# Patient Record
Sex: Female | Born: 1950 | Race: White | Hispanic: No | Marital: Married | Smoking: Former smoker
Health system: Southern US, Community
[De-identification: ages and names within clinical notes are randomized; demographics above are authoritative.]

## PROBLEM LIST (undated history)

## (undated) DIAGNOSIS — R011 Cardiac murmur, unspecified: Secondary | ICD-10-CM

## (undated) DIAGNOSIS — K219 Gastro-esophageal reflux disease without esophagitis: Secondary | ICD-10-CM

## (undated) DIAGNOSIS — M545 Low back pain, unspecified: Secondary | ICD-10-CM

## (undated) DIAGNOSIS — B029 Zoster without complications: Secondary | ICD-10-CM

## (undated) DIAGNOSIS — J449 Chronic obstructive pulmonary disease, unspecified: Secondary | ICD-10-CM

## (undated) DIAGNOSIS — F419 Anxiety disorder, unspecified: Secondary | ICD-10-CM

## (undated) DIAGNOSIS — N390 Urinary tract infection, site not specified: Secondary | ICD-10-CM

## (undated) DIAGNOSIS — J42 Unspecified chronic bronchitis: Secondary | ICD-10-CM

## (undated) DIAGNOSIS — M1990 Unspecified osteoarthritis, unspecified site: Secondary | ICD-10-CM

## (undated) DIAGNOSIS — G8929 Other chronic pain: Secondary | ICD-10-CM

## (undated) DIAGNOSIS — J189 Pneumonia, unspecified organism: Secondary | ICD-10-CM

## (undated) DIAGNOSIS — T4145XA Adverse effect of unspecified anesthetic, initial encounter: Secondary | ICD-10-CM

## (undated) DIAGNOSIS — I341 Nonrheumatic mitral (valve) prolapse: Secondary | ICD-10-CM

## (undated) DIAGNOSIS — F329 Major depressive disorder, single episode, unspecified: Secondary | ICD-10-CM

## (undated) DIAGNOSIS — E7800 Pure hypercholesterolemia, unspecified: Secondary | ICD-10-CM

## (undated) DIAGNOSIS — F32A Depression, unspecified: Secondary | ICD-10-CM

## (undated) DIAGNOSIS — T8859XA Other complications of anesthesia, initial encounter: Secondary | ICD-10-CM

## (undated) DIAGNOSIS — F429 Obsessive-compulsive disorder, unspecified: Secondary | ICD-10-CM

## (undated) DIAGNOSIS — G43909 Migraine, unspecified, not intractable, without status migrainosus: Secondary | ICD-10-CM

## (undated) HISTORY — PX: CHOLECYSTECTOMY: SHX55

## (undated) HISTORY — PX: APPENDECTOMY: SHX54

## (undated) HISTORY — PX: OTHER SURGICAL HISTORY: SHX169

## (undated) HISTORY — PX: CARPAL TUNNEL RELEASE: SHX101

---
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
## 1976-05-21 HISTORY — PX: ABDOMINAL HYSTERECTOMY: SHX81

## 1976-05-21 HISTORY — PX: KIDNEY SURGERY: SHX687

## 1993-07-21 HISTORY — PX: WRIST SURGERY: SHX841

## 2001-10-26 ENCOUNTER — Ambulatory Visit (HOSPITAL_COMMUNITY): Admission: RE | Admit: 2001-10-26 | Discharge: 2001-10-26 | Payer: Self-pay | Admitting: Neurology

## 2001-10-26 ENCOUNTER — Encounter: Payer: Self-pay | Admitting: Neurology

## 2001-12-28 ENCOUNTER — Emergency Department (HOSPITAL_COMMUNITY): Admission: EM | Admit: 2001-12-28 | Discharge: 2001-12-28 | Payer: Self-pay | Admitting: Emergency Medicine

## 2003-01-30 ENCOUNTER — Emergency Department (HOSPITAL_COMMUNITY): Admission: EM | Admit: 2003-01-30 | Discharge: 2003-01-30 | Payer: Self-pay | Admitting: Emergency Medicine

## 2004-02-03 ENCOUNTER — Emergency Department (HOSPITAL_COMMUNITY): Admission: EM | Admit: 2004-02-03 | Discharge: 2004-02-03 | Payer: Self-pay | Admitting: *Deleted

## 2004-03-19 ENCOUNTER — Ambulatory Visit (HOSPITAL_COMMUNITY): Admission: RE | Admit: 2004-03-19 | Discharge: 2004-03-19 | Payer: Self-pay | Admitting: *Deleted

## 2009-03-08 ENCOUNTER — Ambulatory Visit (HOSPITAL_COMMUNITY): Admission: RE | Admit: 2009-03-08 | Discharge: 2009-03-08 | Payer: Self-pay | Admitting: Family Medicine

## 2009-03-13 ENCOUNTER — Ambulatory Visit (HOSPITAL_COMMUNITY): Admission: RE | Admit: 2009-03-13 | Discharge: 2009-03-13 | Payer: Self-pay | Admitting: Family Medicine

## 2009-03-14 ENCOUNTER — Encounter (INDEPENDENT_AMBULATORY_CARE_PROVIDER_SITE_OTHER): Payer: Self-pay | Admitting: *Deleted

## 2009-04-10 ENCOUNTER — Encounter: Payer: Self-pay | Admitting: Urgent Care

## 2009-04-10 ENCOUNTER — Ambulatory Visit: Payer: Self-pay | Admitting: Internal Medicine

## 2009-04-10 DIAGNOSIS — J4489 Other specified chronic obstructive pulmonary disease: Secondary | ICD-10-CM | POA: Insufficient documentation

## 2009-04-10 DIAGNOSIS — R634 Abnormal weight loss: Secondary | ICD-10-CM | POA: Insufficient documentation

## 2009-04-10 DIAGNOSIS — M549 Dorsalgia, unspecified: Secondary | ICD-10-CM | POA: Insufficient documentation

## 2009-04-10 DIAGNOSIS — M129 Arthropathy, unspecified: Secondary | ICD-10-CM | POA: Insufficient documentation

## 2009-04-10 DIAGNOSIS — K5909 Other constipation: Secondary | ICD-10-CM | POA: Insufficient documentation

## 2009-04-10 DIAGNOSIS — J449 Chronic obstructive pulmonary disease, unspecified: Secondary | ICD-10-CM | POA: Insufficient documentation

## 2009-04-10 DIAGNOSIS — R1319 Other dysphagia: Secondary | ICD-10-CM | POA: Insufficient documentation

## 2009-04-11 ENCOUNTER — Encounter: Payer: Self-pay | Admitting: Internal Medicine

## 2009-04-11 LAB — CONVERTED CEMR LAB: TSH: 0.769 microintl units/mL (ref 0.350–4.500)

## 2009-04-16 ENCOUNTER — Encounter: Payer: Self-pay | Admitting: Internal Medicine

## 2009-04-23 ENCOUNTER — Encounter: Payer: Self-pay | Admitting: Internal Medicine

## 2009-04-23 ENCOUNTER — Ambulatory Visit (HOSPITAL_COMMUNITY): Admission: RE | Admit: 2009-04-23 | Discharge: 2009-04-23 | Payer: Self-pay | Admitting: Internal Medicine

## 2009-04-23 ENCOUNTER — Ambulatory Visit: Payer: Self-pay | Admitting: Internal Medicine

## 2009-04-23 HISTORY — PX: COLONOSCOPY WITH ESOPHAGOGASTRODUODENOSCOPY (EGD): SHX5779

## 2009-04-26 ENCOUNTER — Encounter: Payer: Self-pay | Admitting: Internal Medicine

## 2009-09-05 ENCOUNTER — Encounter (INDEPENDENT_AMBULATORY_CARE_PROVIDER_SITE_OTHER): Payer: Self-pay

## 2009-12-03 ENCOUNTER — Emergency Department (HOSPITAL_COMMUNITY): Admission: EM | Admit: 2009-12-03 | Discharge: 2009-12-03 | Payer: Self-pay | Admitting: Emergency Medicine

## 2010-08-10 ENCOUNTER — Encounter: Payer: Self-pay | Admitting: Family Medicine

## 2010-08-22 NOTE — Letter (Signed)
Summary: Patient Notice, Endo Biopsy Results  [facility]  [address]   [place], [place] [address]   Phone: [phone]  Fax: [phone]       April 26, 2009   Elizabeth Fowler [address] [place], [place]  [address] [birth year]    Dear [name],  I am pleased to inform you that the biopsies taken during your recent endoscopic examination did not show any evidence of cancer upon pathologic examination.  Additional information/recommendations:  Continue with the treatment plan as outlined on the day of your exam.  You should have a repeat endoscopic examination in 3 months to make sure ulcer has healed.  Repeat colonoscopy in 10 years.  Please call us if you are having persistent problems or have questions about your condition that have not been fully answered at this time.  Sincerely,    R. Roetta Sessions MD  [facility] Ph: [phone]   Fax: [phone]   Appended Document: Patient Notice, Endo Biopsy Results Noted in IDX  Appended Document: Patient Notice, Endo Biopsy Results Reminder is already in IDX.  AS

## 2010-08-22 NOTE — Letter (Signed)
Summary: Internal Other /TCS/EGD/scheduled  Internal Other /TCS/EGD/scheduled   Imported By: Cloria Spring LPN 60/45/4098 11:91:47  _____________________________________________________________________  External Attachment:    Type:   Image     Comment:   External Document

## 2010-08-22 NOTE — Letter (Signed)
Summary: Recall Colonoscopy/Endoscopy, Change to Office Visit  [facility]  [address]   [place], [place] [address]   Phone: [phone]  Fax: [phone]      September 05, 2009   Elizabeth Fowler [address] [place], [place]  [address] [birth year]   Dear Ms. Dowis,   According to our records, it is time for you to schedule an Endoscopy.   Please call [phone] at your convenience to schedule an office visit  prior to scheduling the procedure. If you have any questions or concerns, please feel free to contact our office.   Sincerely,   Cloria Spring LPN  [facility] Ph: [phone]   Fax: [phone]

## 2010-08-22 NOTE — Assessment & Plan Note (Signed)
Summary: constipation,wt loss/ams   Visit Type:  Initial Consult Referring Provider:  Dr. Shaune Spittle Primary Care Provider:  Dr. Regino Schultze  Chief Complaint:  constipation and wt loss.  History of Present Illness: 60 y/o caucasian female with significant weight loss, 11# in past 2 months.  Also hx chronic constipation.  Take dulcolax helps some.  Bm q5-6 days. Also, notes bright red blood on tissue w/ wiping.  "eats like a pig"  Highest wt 120# 9 mo pregnant. The patient complains of trouble swallowing pills, but denies heartburn, acid reflux, sour taste in mouth, epigastric pain, chest pain, early satiety, postprandial pain, melena, and rectal bleeding.   Denies any other solid or liquid dysphagia.  Tried exlax, MOM, correctol, and Mag citrate for constipation.  Never had colonoscopy.  There is a history of depression and anorexia.    Current Problems (verified): 1)  Other Dysphagia  (ICD-787.29) 2)  Weight Loss, Abnormal  (ICD-783.21) 3)  Arthritis  (ICD-716.90) 4)  Back Pain, Lumbosacral, Chronic  (ICD-724.5) 5)  Constipation, Chronic  (ICD-564.09) 6)  COPD  (ICD-496)  Current Medications (verified): 1)  Hydrocodone-Acetaminophen 10-650 Mg Tabs (Hydrocodone-Acetaminophen) .... As Needed 2)  Valium 10 Mg Tabs (Diazepam) .... Once Daily 3)  Xanax 1 Mg Tabs (Alprazolam) .... As Needed  Allergies (verified): 1)  ! Pcn  Past History:  Past Medical History: OTHER DYSPHAGIA (ICD-787.29) WEIGHT LOSS, ABNORMAL (ICD-783.21) ARTHRITIS (ICD-716.90) BACK PAIN, LUMBOSACRAL, CHRONIC (ICD-724.5) CONSTIPATION, CHRONIC (ICD-564.09) COPD (ICD-496)  Past Surgical History: complete hysterectomy (1997) post-op complications surgery on urethra & bladder Cholecystectomy (1610) Appendectomy (1974)  Family History: No known family history of colorectal carcinoma, IBD, liver or chronic GI problems.  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 23 pkyr hx Patient has been counseled to quit.  Alcohol Use - no Illicit Drug Use - no Daily Caffeine Use Patient gets regular exercise. Smoking Status:  current Drug Use:  no Does Patient Exercise:  yes  Review of Systems General:  Complains of chills, weight loss, and sleep disorder; denies fever, sweats, anorexia, fatigue, weakness, and malaise. CV:  Denies chest pains, angina, palpitations, syncope, dyspnea on exertion, orthopnea, PND, peripheral edema, and claudication. Resp:  Denies dyspnea at rest, dyspnea with exercise, cough, sputum, wheezing, coughing up blood, and pleurisy. GI:  Complains of bloody BM's; denies nausea, indigestion/heartburn, vomiting, vomiting blood, jaundice, black BMs, and fecal incontinence. GU:  Denies urinary burning, blood in urine, nocturnal urination, urinary frequency, urinary incontinence, and abnormal vaginal bleeding. Derm:  Denies rash, itching, dry skin, hives, moles, warts, and unhealing ulcers. Psych:  Complains of depression and anxiety. Heme:  Complains of bruising and bleeding; denies enlarged lymph nodes.  Vital Signs:  Patient profile:   60 year old female Height:      61 inches Weight:      98 pounds BMI:     18.58 Temp:     98.1 degrees F oral Pulse rate:   88 / minute BP sitting:   128 / 80  (left arm) Cuff size:   regular  Vitals Entered By: Hendricks Limes LPN (April 10, 2009 10:56 AM)  Physical Exam  General:  cachectic.   Head:  Normocephalic and atraumatic. Eyes:  Sclera clear, no icterus. Ears:  Normal auditory acuity. Nose:  No deformity,  discharge,  or lesions. Mouth:  No deformity or lesions, dentition normal. Neck:  Supple; no masses or thyromegaly. Lungs:  Clear throughout to auscultation. Heart:  Regular rate and rhythm; no  murmurs, rubs,  or bruits. Abdomen:  Soft, nontender and nondistended. No masses, hepatosplenomegaly or hernias noted. Normal bowel sounds.without guarding and without rebound.   Msk:  Symmetrical with no gross deformities. Normal posture. Pulses:  Normal pulses noted. Extremities:  No clubbing, cyanosis, edema or deformities noted. Neurologic:  Alert and  oriented x4;  grossly normal neurologically. Skin:  Intact without significant lesions or rashes. Cervical Nodes:  No significant cervical adenopathy. Inguinal Nodes:  No significant inguinal adenopathy. Psych:  Alert and cooperative. Normal mood and affect.  Impression & Recommendations:  Problem # 1:  WEIGHT LOSS, ABNORMAL (ICD-783.21) 60 y/o caucasian female with  11# weight loss in past 2 months.  She has not decreased her caloric intake, however, she has hx of anorexia, depression & chronic constipation.  New onset pill dysphagia is worrisome as well and needs to be evaluated to rule out occult malignancy.  Differentials are broad & include PUD, occult malignancy, thyroid disease, or recurrent anorexia.   Diagnostic colonoscopy and EGD with possible esophageal dilatation  to be performed by Dr. Suszanne Conners Rourk in the near future.  I have discussed risks and benefits which include, but are not limited to, bleeding, infection, perforation, or medication reaction.  The patient agrees with this plan and consent will be obtained.  Orders: T-TSH ([address]) Consultation Level IV ([address])  Problem # 2:  OTHER DYSPHAGIA (ICD-787.29)  see #1  Orders: Consultation Level IV ([address]78)  Problem # 3:  CONSTIPATION, CHRONIC (ICD-564.09)  See #1  Patient Instructions: 1)  Begin Miralax 17 grams daily as needed for constipation 2)  Go by lab to get your blood work 3)  Begin fiberchoice or benefiber every day 4)  The medication list was reviewed and reconciled.  All changed / newly prescribed medications were explained.  A complete medication  list was provided to the patient / caregiver. 5)  Constipation and Hemorrhoids brochure given.

## 2010-10-07 LAB — DIFFERENTIAL
Basophils Absolute: 0 10*3/uL (ref 0.0–0.1)
Basophils Relative: 0 % (ref 0–1)
Eosinophils Absolute: 0 10*3/uL (ref 0.0–0.7)
Eosinophils Relative: 0 % (ref 0–5)
Lymphocytes Relative: 8 % — ABNORMAL LOW (ref 12–46)
Lymphs Abs: 1 10*3/uL (ref 0.7–4.0)
Monocytes Absolute: 0.1 10*3/uL (ref 0.1–1.0)
Monocytes Relative: 1 % — ABNORMAL LOW (ref 3–12)
Neutro Abs: 11.7 10*3/uL — ABNORMAL HIGH (ref 1.7–7.7)
Neutrophils Relative %: 92 % — ABNORMAL HIGH (ref 43–77)

## 2010-10-07 LAB — BASIC METABOLIC PANEL
BUN: 14 mg/dL (ref 6–23)
CO2: 25 mEq/L (ref 19–32)
Calcium: 9.3 mg/dL (ref 8.4–10.5)
Chloride: 102 mEq/L (ref 96–112)
Creatinine, Ser: 0.64 mg/dL (ref 0.4–1.2)
GFR calc Af Amer: >60 mL/min (ref >60)
GFR calc non Af Amer: >60 mL/min (ref >60)
Glucose, Bld: 142 mg/dL — ABNORMAL HIGH (ref 70–99)
Potassium: 3.3 mEq/L — ABNORMAL LOW (ref 3.5–5.1)
Sodium: 138 mEq/L (ref 135–145)

## 2010-10-07 LAB — CBC
HCT: 45.2 % (ref 36.0–46.0)
Hemoglobin: 15.7 g/dL — ABNORMAL HIGH (ref 12.0–15.0)
MCHC: 34.8 g/dL (ref 30.0–36.0)
MCV: 91.5 fL (ref 78.0–100.0)
Platelets: 266 10*3/uL (ref 150–400)
RBC: 4.94 MIL/uL (ref 3.87–5.11)
RDW: 12.6 % (ref 11.5–15.5)
WBC: 12.8 10*3/uL — ABNORMAL HIGH (ref 4.0–10.5)

## 2012-05-11 ENCOUNTER — Other Ambulatory Visit (HOSPITAL_COMMUNITY): Payer: Self-pay | Admitting: Family Medicine

## 2012-05-11 DIAGNOSIS — M549 Dorsalgia, unspecified: Secondary | ICD-10-CM

## 2012-05-13 ENCOUNTER — Ambulatory Visit (HOSPITAL_COMMUNITY)
Admission: RE | Admit: 2012-05-13 | Discharge: 2012-05-13 | Disposition: A | Discharge status: Home / Self Care | Payer: Self-pay | Source: Ambulatory Visit | Attending: Family Medicine | Admitting: Family Medicine

## 2012-05-13 DIAGNOSIS — M549 Dorsalgia, unspecified: Secondary | ICD-10-CM

## 2012-05-13 DIAGNOSIS — M545 Low back pain, unspecified: Secondary | ICD-10-CM | POA: Insufficient documentation

## 2013-01-17 ENCOUNTER — Other Ambulatory Visit (HOSPITAL_COMMUNITY): Payer: Self-pay | Admitting: Family Medicine

## 2013-01-17 DIAGNOSIS — G8929 Other chronic pain: Secondary | ICD-10-CM

## 2013-01-19 ENCOUNTER — Ambulatory Visit (HOSPITAL_COMMUNITY)
Admission: RE | Admit: 2013-01-19 | Discharge: 2013-01-19 | Disposition: A | Discharge status: Home / Self Care | Payer: Medicaid Other | Source: Ambulatory Visit | Attending: Family Medicine | Admitting: Family Medicine

## 2013-01-19 DIAGNOSIS — M899 Disorder of bone, unspecified: Secondary | ICD-10-CM | POA: Insufficient documentation

## 2013-01-19 DIAGNOSIS — M949 Disorder of cartilage, unspecified: Secondary | ICD-10-CM | POA: Insufficient documentation

## 2013-01-19 DIAGNOSIS — Z780 Asymptomatic menopausal state: Secondary | ICD-10-CM | POA: Insufficient documentation

## 2013-01-19 DIAGNOSIS — G8929 Other chronic pain: Secondary | ICD-10-CM

## 2013-03-16 ENCOUNTER — Ambulatory Visit (HOSPITAL_COMMUNITY)
Admission: RE | Admit: 2013-03-16 | Discharge: 2013-03-16 | Disposition: A | Discharge status: Home / Self Care | Payer: Medicaid Other | Source: Ambulatory Visit | Attending: Physical Medicine and Rehabilitation | Admitting: Physical Medicine and Rehabilitation

## 2013-03-16 DIAGNOSIS — M2560 Stiffness of unspecified joint, not elsewhere classified: Secondary | ICD-10-CM | POA: Insufficient documentation

## 2013-03-16 DIAGNOSIS — R262 Difficulty in walking, not elsewhere classified: Secondary | ICD-10-CM | POA: Insufficient documentation

## 2013-03-16 DIAGNOSIS — J449 Chronic obstructive pulmonary disease, unspecified: Secondary | ICD-10-CM | POA: Insufficient documentation

## 2013-03-16 DIAGNOSIS — J4489 Other specified chronic obstructive pulmonary disease: Secondary | ICD-10-CM | POA: Insufficient documentation

## 2013-03-16 DIAGNOSIS — M545 Low back pain, unspecified: Secondary | ICD-10-CM | POA: Insufficient documentation

## 2013-03-16 DIAGNOSIS — R29898 Other symptoms and signs involving the musculoskeletal system: Secondary | ICD-10-CM | POA: Insufficient documentation

## 2013-03-16 DIAGNOSIS — IMO0001 Reserved for inherently not codable concepts without codable children: Secondary | ICD-10-CM | POA: Insufficient documentation

## 2013-03-16 DIAGNOSIS — M6281 Muscle weakness (generalized): Secondary | ICD-10-CM | POA: Insufficient documentation

## 2013-03-16 NOTE — Evaluation (Signed)
Physical Therapy Evaluation  Patient Details  Name: Elizabeth Fowler MRN: [id number] Date of Birth: August 18, 1950  Today's Date: 03/16/2013 Time: 1515-1600 PT Time Calculation (min): 45 min Charge:  eval             Visit#: 1 of 1  Re-eval:   Assessment Diagnosis: lumbar strain  Authorization: medicaid     Past Medical History: No past medical history on file. Past Surgical History: No past surgical history on file.  Subjective Symptoms/Limitations Symptoms: Ms. 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  The patient has had no injury or trauma.  She is being referred  for a HEP How long can you sit comfortably?: limited to 10 minutes How long can you stand comfortably?: limited to  20-30 minutes How long can you walk comfortably?: limited  to 10 minutes Pain Assessment Currently in Pain?: Yes Pain Score: 10-Worst pain ever Pain Location: Back Pain Orientation: Right;Left Pain Type: Chronic pain Pain Onset: More than a month ago Pain Frequency: Constant Pain Relieving Factors: nothing Effect of Pain on Daily Activities: activity   Cognition/Observation Cognition Overall Cognitive Status: Within Functional Limits for tasks assessed  Sensation/Coordination/Flexibility/Functional Tests Functional Tests Functional Tests: LEFS 40/50  Assessment RLE Strength RLE Overall Strength: Deficits (all hip mm 3-/5; knee mm 3/5) LLE Strength LLE Overall Strength: Deficits (all hip mm 3-/5 , knee 3/5) Lumbar AROM Lumbar Flexion: Decreased 60% Lumbar Extension: decreased 50% Lumbar - Right Side Bend: decreased 50% Lumbar - Left Side Bend: decreased 30% Lumbar - Right Rotation: decreasecd 15% Lumbar - Left Rotation: decreased  Exercise/Treatments Mobility/Balance  Posture/Postural Control Posture/Postural Control: Postural limitations Postural Limitations: R ASIS high; R iliac crest high; R PSIS low.   Stabilization  exercises: Ab set, bent knee raise, bridge, hip isometric adduction, heel squeeze, trunk rotation x 5    Physical Therapy Assessment and Plan PT Assessment and Plan Clinical Impression Statement: Pt is a 62 yo female who is referred for chronic LBP.  Pt presents with decreased ROM, decreased strength, increased pain and decreased activity tolerance. PT Frequency: Min 1X/week PT Duration:  (1 time visit only) PT Treatment/Interventions: Therapeutic exercise PT Plan: one time visit for HEP    Goals Home Exercise Program Pt/caregiver will Perform Home Exercise Program: For increased ROM;For increased strengthening PT Goal: Perform Home Exercise Program - Progress: Goal set today  Problem List Patient Active Problem List   Diagnosis Date Noted  . Stiffness of joints, not elsewhere classified, multiple sites 03/16/2013  . Weakness of both legs 03/16/2013  . Difficulty in walking(719.7) 03/16/2013  . COPD 04/10/2009  . CONSTIPATION, CHRONIC 04/10/2009  . ARTHRITIS 04/10/2009  . BACK PAIN, LUMBOSACRAL, CHRONIC 04/10/2009  . WEIGHT LOSS, ABNORMAL 04/10/2009  . OTHER DYSPHAGIA 04/10/2009    PT Plan of Care PT Home Exercise Plan: HEP  GP    RUSSELL,CINDY 03/16/2013, 4:53 PM  Physician Documentation Your signature is required to indicate approval of the treatment plan as stated above.  Please sign and either send electronically or make a copy of this report for your files and return this physician signed original.   Please mark one 1.__approve of plan  2. ___approve of plan with the following conditions.   ______________________________  _____________________ Physician Signature                                                                                                             Date

## 2013-04-30 ENCOUNTER — Emergency Department (HOSPITAL_COMMUNITY)
Admission: EM | Admit: 2013-04-30 | Discharge: 2013-05-01 | Disposition: A | Discharge status: Home / Self Care | Payer: Medicaid Other | Attending: Emergency Medicine | Admitting: Emergency Medicine

## 2013-04-30 ENCOUNTER — Encounter (HOSPITAL_COMMUNITY): Payer: Self-pay | Admitting: Emergency Medicine

## 2013-04-30 DIAGNOSIS — I10 Essential (primary) hypertension: Secondary | ICD-10-CM | POA: Insufficient documentation

## 2013-04-30 DIAGNOSIS — Z880 Allergy status to penicillin: Secondary | ICD-10-CM | POA: Insufficient documentation

## 2013-04-30 DIAGNOSIS — G8929 Other chronic pain: Secondary | ICD-10-CM | POA: Insufficient documentation

## 2013-04-30 DIAGNOSIS — M545 Low back pain, unspecified: Secondary | ICD-10-CM | POA: Insufficient documentation

## 2013-04-30 DIAGNOSIS — F17200 Nicotine dependence, unspecified, uncomplicated: Secondary | ICD-10-CM | POA: Insufficient documentation

## 2013-04-30 DIAGNOSIS — Z79899 Other long term (current) drug therapy: Secondary | ICD-10-CM | POA: Insufficient documentation

## 2013-04-30 MED ORDER — DIAZEPAM 5 MG PO TABS
5.0000 mg | ORAL_TABLET | Freq: Once | ORAL | Status: AC
  Administered 2013-04-30: 5 mg via ORAL
  Filled 2013-04-30: qty 1

## 2013-04-30 MED ORDER — ONDANSETRON HCL 4 MG/2ML IJ SOLN
4.0000 mg | Freq: Once | INTRAMUSCULAR | Status: AC
  Administered 2013-04-30: 4 mg via INTRAVENOUS
  Filled 2013-04-30: qty 2

## 2013-04-30 MED ORDER — HYDROMORPHONE HCL PF 1 MG/ML IJ SOLN
1.0000 mg | Freq: Once | INTRAMUSCULAR | Status: AC
  Administered 2013-04-30: 1 mg via INTRAVENOUS
  Filled 2013-04-30: qty 1

## 2013-04-30 NOTE — ED Provider Notes (Signed)
CSN: [id number]     Arrival date & time 04/30/13  2028 History   None   Scribed for Sunnie Nielsen, MD, the patient was seen in room APA06/APA06. This chart was scribed by Lewanda Rife, ED scribe. Patient's care was started at 11:06 PM  Chief Complaint  Patient presents with  . Back Pain   (Consider location/radiation/quality/duration/timing/severity/associated sxs/prior Treatment) The history is provided by the patient. No language interpreter was used.   HPI Comments: Elizabeth Fowler is a 62 y.o. female who presents to the Emergency Department complaining of chronic low back pain radiating around waist and down groin onset 3 months. Describes back pain as moderate to severe and unchanged. Reports associated generalized chronic weakness and numbness. Denies any aggravating or alleviating factors. Denies associated fever, burning with urination, recent injury, urinary or bowel incontinence, and recent fall. Reports taking dilaudid and valium PTA with mild relief of symptoms.   Chronic back pain followed by orthopedist Dr. Ethelene Hal with [facility] Reports MRI was done 2 weeks ago. Pt is unsure of the results, but was offered surgery. Pt will be receiving cortisone injections by Dr. Ethelene Hal.   Past Medical History  Diagnosis Date  . Chronic back pain   . Heart valve problem   . Hypertension    Past Surgical History  Procedure Laterality Date  . Cholecystectomy    . Abdominal hysterectomy    . Kidney surgery     History reviewed. No pertinent family history. History  Substance Use Topics  . Smoking status: Current Some Day Smoker  . Smokeless tobacco: Not on file  . Alcohol Use: No   OB History   Grav Para Term Preterm Abortions TAB SAB Ect Mult Living                 Review of Systems  Musculoskeletal: Positive for back pain.  All other systems reviewed and are negative.   A complete 10 system review of systems was obtained and all systems are negative except as  noted in the HPI and PMHx.    Allergies  Penicillins  Home Medications   Current Outpatient Rx  Name  Route  Sig  Dispense  Refill  . ALPRAZolam (XANAX) 1 MG tablet   Oral   Take 1 mg by mouth 2 (two) times daily as needed for anxiety.         . Calcium Carbonate-Vitamin D (CALTRATE 600+D) 600-400 MG-UNIT per tablet   Oral   Take 1 tablet by mouth daily.         . diazepam (VALIUM) 10 MG tablet   Oral   Take 10 mg by mouth every 6 (six) hours as needed for anxiety.         . gabapentin (NEURONTIN) 300 MG capsule   Oral   Take 300 mg by mouth 3 (three) times daily.         Marland Kitchen HYDROmorphone (DILAUDID) 4 MG tablet   Oral   Take 4 mg by mouth every 6 (six) hours as needed for pain.         Marland Kitchen lisinopril (PRINIVIL,ZESTRIL) 5 MG tablet   Oral   Take 5 mg by mouth daily.          BP 123/65  Pulse 70  Temp(Src) 98.1 F (36.7 C) (Oral)  Resp 20  Ht 5\' 1"  (1.549 m)  Wt 116 lb (52.617 kg)  BMI 21.93 kg/m2  SpO2 100% Physical Exam  Nursing note and vitals reviewed. Constitutional:  She is oriented to person, place, and time. She appears well-developed and well-nourished. No distress.  HENT:  Head: Normocephalic and atraumatic.  Eyes: EOM are normal.  Neck: Neck supple. No tracheal deviation present.  Cardiovascular: Normal rate.   Pulmonary/Chest: Effort normal. No respiratory distress.  Musculoskeletal: Normal range of motion.  TTP throughout lumbar and paralumbar region  No deformities of l-spine  No lower extremity deficits with equal DTRs, sensorium to light touch through out.   Neurological: She is alert and oriented to person, place, and time. She has normal reflexes.  5/5 strength in bilateral lower extremities. Ankle plantar and dorsiflexion intact. +2 DP and PT pulses. +2 patellar reflexes bilaterally.   Skin: Skin is warm and dry.  Psychiatric: She has a normal mood and affect. Her behavior is normal.    ED Course  Procedures   COORDINATION OF  CARE:  Nursing notes reviewed. Vital signs reviewed. Initial pt interview and examination performed.   11:09 PM-Discussed treatment plan with pt at bedside, which includes applying ice and pain control. Pt agrees with plan.   Treatment plan initiated: Medications  HYDROmorphone (DILAUDID) injection 1 mg (1 mg Intravenous Given 04/30/13 2342)  ondansetron (ZOFRAN) injection 4 mg (4 mg Intravenous Given 04/30/13 2343)  diazepam (VALIUM) tablet 5 mg (5 mg Oral Given 04/30/13 2343)     1:10 AM on recheck patient is feeling much better, able to move and ambulate. She is requesting one more shot of Dilaudid and would prefer to be discharged home. No indication for repeat emergent imaging at this time. Plan close outpatient followup with her surgeon. She has prescriptions at home and states understanding all discharge and followup instructions including strict return precautions.   MDM  Diagnosis: Low back pain   Improved with IV narcotics and medications Vital signs nursing notes reviewed and considered   I personally performed the services described in this documentation, which was scribed in my presence. The recorded information has been reviewed and is accurate.     Sunnie Nielsen, MD 05/01/13 [phone]

## 2013-04-30 NOTE — ED Notes (Addendum)
Pt with lower back pain that is chronic, pt states that pain wraps around waist and radiates down, with sharp pain that radiates on bilateral groin, pt took dilaudid and Valium PTA with very little relief which is new, states that she has been using dilaudid for pain x 3 months, denies burning with urination

## 2013-04-30 NOTE — ED Notes (Signed)
Patient states that her back hurts to the point she was unable to go to restroom. Assisted  patient with bed pan to obtain urine specimen.

## 2013-04-30 NOTE — ED Notes (Signed)
Pt has chronic back pain. Pt is being followed by Dr. Ethelene Hal (orthopedic). Pt has not been able to walk without assistance for 2 days. Pt has taken 1 4mg  Dialudid tablet and 1 10mg  (?) valium around 6:30pm without relief.

## 2013-05-01 MED ORDER — HYDROMORPHONE HCL PF 1 MG/ML IJ SOLN
1.0000 mg | Freq: Once | INTRAMUSCULAR | Status: AC
  Administered 2013-05-01: 1 mg via INTRAVENOUS
  Filled 2013-05-01: qty 1

## 2013-06-02 ENCOUNTER — Emergency Department (HOSPITAL_COMMUNITY)
Admission: EM | Admit: 2013-06-02 | Discharge: 2013-06-02 | Disposition: A | Discharge status: Home / Self Care | Payer: Medicaid Other | Attending: Emergency Medicine | Admitting: Emergency Medicine

## 2013-06-02 ENCOUNTER — Encounter (HOSPITAL_COMMUNITY): Payer: Self-pay | Admitting: Emergency Medicine

## 2013-06-02 DIAGNOSIS — I10 Essential (primary) hypertension: Secondary | ICD-10-CM | POA: Insufficient documentation

## 2013-06-02 DIAGNOSIS — G8929 Other chronic pain: Secondary | ICD-10-CM | POA: Insufficient documentation

## 2013-06-02 DIAGNOSIS — Z79899 Other long term (current) drug therapy: Secondary | ICD-10-CM | POA: Insufficient documentation

## 2013-06-02 DIAGNOSIS — M545 Low back pain, unspecified: Secondary | ICD-10-CM | POA: Insufficient documentation

## 2013-06-02 DIAGNOSIS — M549 Dorsalgia, unspecified: Secondary | ICD-10-CM

## 2013-06-02 DIAGNOSIS — F17200 Nicotine dependence, unspecified, uncomplicated: Secondary | ICD-10-CM | POA: Insufficient documentation

## 2013-06-02 DIAGNOSIS — Z880 Allergy status to penicillin: Secondary | ICD-10-CM | POA: Insufficient documentation

## 2013-06-02 MED ORDER — DEXAMETHASONE SODIUM PHOSPHATE 10 MG/ML IJ SOLN
10.0000 mg | Freq: Once | INTRAMUSCULAR | Status: AC
  Administered 2013-06-02: 10 mg via INTRAMUSCULAR
  Filled 2013-06-02: qty 1

## 2013-06-02 MED ORDER — HYDROMORPHONE HCL PF 1 MG/ML IJ SOLN
1.0000 mg | INTRAMUSCULAR | Status: AC | PRN
  Administered 2013-06-02 (×2): 1 mg via INTRAMUSCULAR
  Filled 2013-06-02 (×2): qty 1

## 2013-06-02 MED ORDER — GABAPENTIN 300 MG PO CAPS: ORAL_CAPSULE | ORAL | Status: DC

## 2013-06-02 NOTE — ED Notes (Signed)
Pt with chronic back problems, states pain worsened this evening, has taken her 4 mg dilaudid tablet and valium po prior to arrival by ems.

## 2013-06-02 NOTE — ED Provider Notes (Signed)
CSN: [id number]     Arrival date & time 06/02/13  1959 History   First MD Initiated Contact with Patient 06/02/13 2009     Chief Complaint  Patient presents with  . Back Pain    HPI  Pt with Back pain.  See's Dr. Ethelene Hal AT [facility].  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  Pt referred for ESI, and received first injection 3 weeks ago, with brief and minimal improvement.  No worsening of symptoms.  Good and Bad days. Vacuumed yesterday, but  No other excessive activity.  Pain worse today.  No weakness, no B/B incontinance/retention.  No Falls.  Pain is right lower back to right groin, right medial knee, and right instep (L4 Pattern).  Normal perineal sensation.  Past Medical History  Diagnosis Date  . Chronic back pain   . Heart valve problem   . Hypertension    Past Surgical History  Procedure Laterality Date  . Cholecystectomy    . Abdominal hysterectomy    . Kidney surgery     No family history on file. History  Substance Use Topics  . Smoking status: Current Some Day Smoker  . Smokeless tobacco: Not on file  . Alcohol Use: No   OB History   Grav Para Term Preterm Abortions TAB SAB Ect Mult Living                 Review of Systems  Constitutional: Negative for fever, chills, diaphoresis, appetite change and fatigue.  HENT: Negative for mouth sores, sore throat and trouble swallowing.   Eyes: Negative for visual disturbance.  Respiratory: Negative for cough, chest tightness, shortness of breath and wheezing.   Cardiovascular: Negative for chest pain.  Gastrointestinal: Negative for nausea, vomiting, abdominal pain, diarrhea and abdominal distention.  Endocrine: Negative for polydipsia, polyphagia and polyuria.  Genitourinary: Negative for dysuria, frequency and hematuria.  Musculoskeletal: Positive for back pain. Negative for gait problem.  Skin: Negative for color change, pallor and rash.  Neurological: Negative for dizziness, syncope,  light-headedness and headaches.  Hematological: Does not bruise/bleed easily.  Psychiatric/Behavioral: Negative for behavioral problems and confusion.    Allergies  Penicillins  Home Medications   Current Outpatient Rx  Name  Route  Sig  Dispense  Refill  . ALPRAZolam (XANAX) 1 MG tablet   Oral   Take 1 mg by mouth 2 (two) times daily as needed for anxiety.         . Calcium Carbonate-Vitamin D (CALTRATE 600+D) 600-400 MG-UNIT per tablet   Oral   Take 1 tablet by mouth daily.         . diazepam (VALIUM) 10 MG tablet   Oral   Take 10 mg by mouth every 6 (six) hours as needed for anxiety.         . gabapentin (NEURONTIN) 300 MG capsule   Oral   Take 300 mg by mouth 3 (three) times daily.         Marland Kitchen HYDROmorphone (DILAUDID) 4 MG tablet   Oral   Take 4 mg by mouth every 6 (six) hours as needed for pain.         Marland Kitchen lisinopril (PRINIVIL,ZESTRIL) 5 MG tablet   Oral   Take 5 mg by mouth daily.         Marland Kitchen gabapentin (NEURONTIN) 300 MG capsule      2am, 1at lunch, 1pm for 7 days 2am, 2 at lunch, 1 pm for 7 days 2am,  2at lunch, 2 pm   100 capsule   1    BP 102/72  Pulse 76  Temp(Src) 98.2 F (36.8 C) (Oral)  Resp 16  Ht 5' 1.5" (1.562 m)  Wt 115 lb (52.164 kg)  BMI 21.38 kg/m2  SpO2 96% Physical Exam  Constitutional: She is oriented to person, place, and time. She appears well-developed and well-nourished. No distress.  Pt in LLD position, Hips flexed, knees flexed.  States that this is POC for her.  HENT:  Head: Normocephalic.  Eyes: Conjunctivae are normal. Pupils are equal, round, and reactive to light. No scleral icterus.  Neck: Normal range of motion. Neck supple. No thyromegaly present.  Cardiovascular: Normal rate and regular rhythm.  Exam reveals no gallop and no friction rub.   No murmur heard. Pulmonary/Chest: Effort normal and breath sounds normal. No respiratory distress. She has no wheezes. She has no rales.  Abdominal: Soft. Bowel sounds are  normal. She exhibits no distension. There is no tenderness. There is no rebound.  Musculoskeletal: Normal range of motion.  Neurological: She is alert and oriented to person, place, and time.  Pt indicated L4 pattern of radicular pain.  No vesicles.  Normal flex/estend of hips, and knees--symmetric.  Normal sensation, with minimal decrease right medial thigh.  Normal DTR 1+/symmetric to Patellar, and achilles. Normal perineal sensation.  Skin: Skin is warm and dry. No rash noted.  Psychiatric: She has a normal mood and affect. Her behavior is normal.    ED Course  Procedures (including critical care time) Labs Review Labs Reviewed - No data to display Imaging Review No results found.  EKG Interpretation   None       MDM   1. Back pain    Pt with clear radicular pain, but no functional loss.  In process of eval and treatment for Radiculopathy.  No sign of HNP/Myeklitis/Cauda Equina.  Pt understands that ER visit will be about attempt at pain control.  givem IM dilaudid 1mg  x2.  IM Decadrom 10mg  x 1.  Will taper up her Neurontin as pt only on 900mg /day.  Rx given.  Return precautions given.    Roney Marion, MD 06/02/13 2137

## 2013-07-29 NOTE — H&P (Signed)
History of Present Illness  The patient is a 63 year old female who presents with back pain. The patient is here today in referral from Dr Ethelene Halamos. The patient reports low back symptoms including low back pain, pain with range of motion and weakness, bilateral legs which began 10 year(s) ago without any known injury. Symptoms are reported to be located symmetrically. The pain radiates to the left buttock, left thigh, right buttock and right thigh. The patient describes the severity of their symptoms as moderate in severity. The patient feels as if the symptoms are unchanging. Symptoms are exacerbated by standing, sitting and bending. Symptoms are relieved by recumbency and opioid analgesics. Current treatment includes opioid analgesics (Hydromorphone from PCP). Past evaluation has included x-ray of the lumbar spine and [facility] the lumbar [facility] ([facility] Past treatment has included opioid analgesics (Hydrocodone) and epidural injections (did not help).     Subjective Transcription  PRIMARY CARE PHYSICIAN:  Dr. Karleen HampshireWilliam McGough.    HISTORY OF PRESENT ILLNESS:  The patient is a very pleasant young woman who has been having progressive debilitating back pain for the last six months. She states that her quality of life has continued to suffer. She has had to injections with my partner Dr. Ethelene Halamos and she has had exercise therapy, medications, and she continues to deteriorate. Her overall quality of life is quite poor. As a result of this she was referred to me for further work up and treatment.    Allergies Darvon *ANALGESICS - OPIOID* Penicillins Codeine/Codeine Derivatives    Social History Exercise. Exercises never Alcohol use. former drinker Children. 2 Tobacco use. Current some day smoker. current some days smoker; smoke(d) less than 1/2 pack(s) per day Drug/Alcohol Rehab (Currently). no Drug/Alcohol Rehab (Previously). no Illicit drug use. no Living  situation. live with spouse Marital status. married Current work status. disabled Pain Contract. no Tobacco / smoke exposure. yes outdoors only    Medication History ALPRAZolam (1MG  Tablet, Oral as needed) Active. Diazepam (10MG  Tablet, 1 Oral every six hours, as needed) Active. HYDROmorphone HCl (8MG  Tablet, 1 Oral every six hours, as needed) Active. Neurontin (300MG  Capsule, 1 (one) Oral three times daily, Taken 05/24/2013 to 06/26/2013) Inactive. Medications Reconciled.    Past Surgical History Appendectomy Carpal Tunnel Repair. left Colon Polyp Removal - Colonoscopy Gallbladder Surgery. open Hysterectomy. complete (non-cancerous)    Other Problems Anxiety Disorder Chronic Pain Depression High blood pressure Migraine Headache Osteoarthritis Osteoporosis    Objective Transcription  On clinical exam he is a pleasant woman who appears younger than her stated age. She is alert and oriented times three. She has no history of incontinence of bowel and bladder. She has significant back pain with forward flexion, very little with extension. Compartments are soft and nontender. Diminished, but intact peripheral pulses. No real hip, knee, or ankle pain with joint range of motion. Significant back pain with radiation into the gluteal region. She will occasionally get radicular neuropathic leg pain, but the bulk of her problem is back and buttock pain. No incontinence of bowel and bladder. The abdomen is soft and nontender.    RADIOGRAPHS:  X rays demonstrate grade I, borderline II degenerative slip. No apparent pars defect. There is significant collapse of the disc space at L5-S1. No scoliosis.    MRI dated 04/08/13 shows the grade I degenerative slip of L5 on S1. No pars defect is identified. There is right far lateral disc bulging. No other significant abnormalities.       Assessment &  Plan Lumbar disc degeneration  (722.52)    Plans Transcription  At this point in time the patient has had extensive conservative care consisting of injection, exercise, and narcotic medications. Her quality of life continues to suffer. She has a structural abnormality and collapse of the disc. It is for these reasons that we would like to proceed with a fusion surgery. Since the pars is intact and she is having mostly discogenic back pain, I have recommended an anterior lumbar interbody fusion. This is the best visualization of the disc to removal of the collapse disc and restoration of disc height providing indirect foraminal decompression. The risks of that include infection, bleeding, nerve damage, death, stoke, paralysis, failure to heal, need for further surgery, ongoing or worse pain, loss of bowel and bladder control, blood clots, need for posterior instrumentation, or decompression. I have counseled her about smoking and she has made efforts to decrease this. Because of the need to obtain a solid fusion and her smoking history, I would also recommend an external bone stimulator following surgery. We will get preoperative clearance from Dr. Regino Schultze her primary care physician, and as soon as we have that we will proceed with the surgery in a timely fashion.

## 2013-08-05 ENCOUNTER — Telehealth: Payer: Self-pay | Admitting: Vascular Surgery

## 2013-08-05 ENCOUNTER — Other Ambulatory Visit: Payer: Self-pay

## 2013-08-05 NOTE — Telephone Encounter (Addendum)
Message copied by Fredrich BirksMILLIKAN, DANA P on Fri Aug 05, 2013  1:53 PM ------      Message from: Phillips OdorPULLINS, CAROL S      Created: Fri Aug 05, 2013  1:25 PM      Regarding: consult / TFE       Please schedule this pt. For consult with Dr. Arbie CookeyEarly prior to ALIF scheduled 08/31/13 (he is assisting Dr. Shon BatonBrooks)  Please remind pt. To bring LS film via CD ROM with her to the appt.  ------  08/05/13: lm for pt to call for appt information, dpm

## 2013-08-15 ENCOUNTER — Encounter: Payer: Self-pay | Admitting: Vascular Surgery

## 2013-08-16 ENCOUNTER — Ambulatory Visit (INDEPENDENT_AMBULATORY_CARE_PROVIDER_SITE_OTHER): Payer: Medicaid Other | Admitting: Vascular Surgery

## 2013-08-16 ENCOUNTER — Encounter: Payer: Self-pay | Admitting: Vascular Surgery

## 2013-08-16 ENCOUNTER — Encounter (INDEPENDENT_AMBULATORY_CARE_PROVIDER_SITE_OTHER): Payer: Self-pay

## 2013-08-16 VITALS — BP 114/51 | HR 72 | Resp 18 | Ht 61.5 in | Wt 121.2 lb

## 2013-08-16 DIAGNOSIS — IMO0002 Reserved for concepts with insufficient information to code with codable children: Secondary | ICD-10-CM

## 2013-08-16 NOTE — Progress Notes (Signed)
Patient name: Elizabeth Fowler MRN: [id number] DOB: [birth year] Sex: female   Referred by: Shon Baton  Reason for referral:  Chief Complaint  Patient presents with  . New Evaluation    evaluation prior to ALIF surgery    HISTORY OF PRESENT ILLNESS: Patient is a 63 year old white female with progressive degenerative disc disease. She is scheduled for L5-S1 disc surgery with an anterior exposure. She is seeing me to discuss vascular surgery roll and exposure. She reports both a severe back pain and lower trim any discomfort related to this. She does have extensive past abdominal surgical history. This includes hysterectomy through a low transverse incision. She does have a low midline incision. 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 On further discussion it sounds as though she had a ureteral injury on the left and had some reconstruction at that time.  Past Medical History  Diagnosis Date  . Chronic back pain   . Heart valve problem   . Hypertension     Past Surgical History  Procedure Laterality Date  . Cholecystectomy    . Abdominal hysterectomy    . Kidney surgery      History   Social History  . Marital Status: Married    Spouse Name: N/A    Number of Children: N/A  . Years of Education: N/A   Occupational History  . Not on file.   Social History Main Topics  . Smoking status: Former Smoker    Quit date: 07/31/2013  . Smokeless tobacco: Not on file  . Alcohol Use: No  . Drug Use: Not on file  . Sexual Activity: Not on file   Other Topics Concern  . Not on file   Social History Narrative  . No narrative on file    Family History  Problem Relation Age of Onset  . Heart disease Mother   . Hypertension Mother     Allergies as of 08/16/2013 - Review Complete 08/16/2013  Allergen Reaction Noted  . Penicillins      Current Outpatient Prescriptions on File Prior to Visit  Medication Sig Dispense  Refill  . ALPRAZolam (XANAX) 1 MG tablet Take 1 mg by mouth 2 (two) times daily as needed for anxiety.      . Calcium Carbonate-Vitamin D (CALTRATE 600+D) 600-400 MG-UNIT per tablet Take 1 tablet by mouth daily.      . diazepam (VALIUM) 10 MG tablet Take 10 mg by mouth every 6 (six) hours as needed for anxiety.      . gabapentin (NEURONTIN) 300 MG capsule Take 300 mg by mouth 3 (three) times daily.      Marland Kitchen HYDROmorphone (DILAUDID) 4 MG tablet Take 8 mg by mouth every 6 (six) hours as needed for pain.       Marland Kitchen gabapentin (NEURONTIN) 300 MG capsule 2am, 1at lunch, 1pm for 7 days 2am, 2 at lunch, 1 pm for 7 days 2am, 2at lunch, 2 pm  100 capsule  1  . lisinopril (PRINIVIL,ZESTRIL) 5 MG tablet Take 5 mg by mouth daily.       No current facility-administered medications on file prior to visit.     REVIEW OF SYSTEMS:  Positives indicated with an "X"  CARDIOVASCULAR:  [ ]  chest pain   [ ]  chest pressure   [ ]  palpitations   [ ]  orthopnea   [ ]  dyspnea on exertion   [ ]  claudication   [ ]   rest pain   [ ]  DVT   [ ]  phlebitis PULMONARY:   [ ]  productive cough   [ ]  asthma   [ ]  wheezing NEUROLOGIC:   [x ] weakness  [x ] paresthesias  [ ]  aphasia  [ ]  amaurosis  [ ]  dizziness HEMATOLOGIC:   [ ]  bleeding problems   [ ]  clotting disorders MUSCULOSKELETAL:  [ ]  joint pain   [ ]  joint swelling GASTROINTESTINAL: [ ]   blood in stool  [ ]   hematemesis GENITOURINARY:  [ ]   dysuria  [ ]   hematuria PSYCHIATRIC:  [ ]  history of major depression INTEGUMENTARY:  [ ]  rashes  [ ]  ulcers CONSTITUTIONAL:  [ ]  fever   [ ]  chills  PHYSICAL EXAMINATION:  General: The patient is a well-nourished female, in no acute distress. Vital signs are BP 114/51  Pulse 72  Resp 18  Ht 5' 1.5" (1.562 m)  Wt 121 lb 3.2 oz (54.976 kg)  BMI 22.53 kg/m2 Pulmonary: There is a good air exchange bilaterally  Abdomen: Soft and non-tender with normal pitch bowel sounds. She does have a low midline and transverse  incision Musculoskeletal: There are no major deformities.  There is no significant extremity pain. Neurologic: No focal weakness or paresthesias are detected, Skin: There are no ulcer or rashes noted. Psychiatric: The patient has normal affect. Cardiovascular: There is a regular rate and rhythm without significant murmur appreciated. Pulse status: 2+ radial 2+ femoral and 2+ dorsalis pedis pulses bilaterally  Impression and Plan:  L5-S1 disc disease. I discussed my role in exposure. She is a thin and actually this does make this exposure much easier. I am concerned regarding her prior intra-abdominal procedure. I explained that this would A. have some complexity for mobilization. I explained that there is a slight risk for ureteral injury as well. It is also explained mobilization of intraperitoneal contents and also the iliac arteries and veins. I did review her MRI and plain films. This does show some scattered calcific plaque in her aorta this does not appear extensive and I do not feel that there would be any danger from mobilizing this. Fortunately she did quit smoking 2 weeks ago and I encouraged her to continue this. We will be available for anterior exposure as scheduled on 08/31/2048    Marquette Piontek [facility] [place] Office: [phone]

## 2013-08-17 ENCOUNTER — Encounter (HOSPITAL_COMMUNITY): Payer: Self-pay | Admitting: Pharmacy Technician

## 2013-08-22 NOTE — Pre-Procedure Instructions (Addendum)
Elizabeth Fowler  08/22/2013   Your procedure is scheduled on:  Wednesday, February 11.  [facility], Main Entrance [facility]/Entrance "A" at 6:30 AM.  Call this number if you have problems the morning of surgery: [phone]   Remember:   Do not eat food or drink liquids after midnight  Tuesday, February 10.   Take these medicines the morning of surgery with A SIP OF WATER: gabapentin (NEURONTIN). May take take Xanax or Valium.  May take pain medication of needed.    Do not wear jewelry, make-up or nail polish.  Do not wear lotions, powders, or perfumes. You may wear deodorant.  Do not shave 48 hours prior to surgery.   Do not bring valuables to the hospital.  [facility] is not responsible for any belongings or valuables.               Contacts, dentures or bridgework may not be worn into surgery.  Leave suitcase in the car. After surgery it may be brought to your room.  For patients admitted to the hospital, discharge time is determined by your treatment team.                Special Instructions:[place] - Preparing for Surgery  Before surgery, you can play an important role.  Because skin is not sterile, your skin needs to be as free of germs as possible.  You can reduce the number of germs on you skin by washing with CHG (chlorahexidine gluconate) soap before surgery.  CHG is an antiseptic cleaner which kills germs and bonds with the skin to continue killing germs even after washing.  Please DO NOT use if you have an allergy to CHG or antibacterial soaps.  If your skin becomes reddened/irritated stop using the CHG and inform your nurse when you arrive at Short Stay.  Do not shave (including legs and underarms) for at least 48 hours prior to the first CHG shower.  You may shave your face.  Please follow these instructions carefully:   1.  Shower with CHG Soap the night before surgery and the                                morning of Surgery.  2.  If you choose to  wash your hair, wash your hair first as usual with your       normal shampoo.  3.  After you shampoo, rinse your hair and body thoroughly to remove the                      Shampoo.  4.  Use CHG as you would any other liquid soap.  You can apply chg directly       to the skin and wash gently with scrungie or a clean washcloth.  5.  Apply the CHG Soap to your body ONLY FROM THE NECK DOWN.        Do not use on open wounds or open sores.  Avoid contact with your eyes,       ears, mouth and genitals (private parts).  Wash genitals (private parts)       with your normal soap.  6.  Wash thoroughly, paying special attention to the area where your surgery        will be performed.  7.  Thoroughly rinse your body with warm water from the neck  down.  8.  DO NOT shower/wash with your normal soap after using and rinsing off       the CHG Soap.  9.  Pat yourself dry with a clean towel.            10.  Wear clean pajamas.            11.  Place clean sheets on your bed the night of your first shower and do not        sleep with pets.  Day of Surgery  Do not apply any lotions/deoderants the morning of surgery.  Please wear clean clothes to the hospital/surgery center.     Please read over the following fact sheets that you were given: Pain Booklet, Coughing and Deep Breathing, Blood Transfusion Information and Surgical Site Infection Prevention

## 2013-08-23 ENCOUNTER — Encounter (HOSPITAL_COMMUNITY)
Admission: RE | Admit: 2013-08-23 | Discharge: 2013-08-23 | Disposition: A | Discharge status: Home / Self Care | Payer: Medicaid Other | Source: Ambulatory Visit | Attending: Orthopedic Surgery | Admitting: Orthopedic Surgery

## 2013-08-23 ENCOUNTER — Ambulatory Visit (HOSPITAL_COMMUNITY)
Admission: RE | Admit: 2013-08-23 | Discharge: 2013-08-23 | Disposition: A | Discharge status: Home / Self Care | Payer: Medicaid Other | Source: Ambulatory Visit | Attending: Orthopedic Surgery | Admitting: Orthopedic Surgery

## 2013-08-23 ENCOUNTER — Encounter (HOSPITAL_COMMUNITY): Payer: Self-pay

## 2013-08-23 DIAGNOSIS — IMO0002 Reserved for concepts with insufficient information to code with codable children: Secondary | ICD-10-CM | POA: Insufficient documentation

## 2013-08-23 DIAGNOSIS — Z01810 Encounter for preprocedural cardiovascular examination: Secondary | ICD-10-CM | POA: Insufficient documentation

## 2013-08-23 DIAGNOSIS — Z01812 Encounter for preprocedural laboratory examination: Secondary | ICD-10-CM | POA: Insufficient documentation

## 2013-08-23 DIAGNOSIS — Z01818 Encounter for other preprocedural examination: Secondary | ICD-10-CM | POA: Insufficient documentation

## 2013-08-23 HISTORY — DX: Unspecified osteoarthritis, unspecified site: M19.90

## 2013-08-23 HISTORY — DX: Adverse effect of unspecified anesthetic, initial encounter: T41.45XA

## 2013-08-23 HISTORY — DX: Other complications of anesthesia, initial encounter: T88.59XA

## 2013-08-23 HISTORY — DX: Chronic obstructive pulmonary disease, unspecified: J44.9

## 2013-08-23 HISTORY — DX: Major depressive disorder, single episode, unspecified: F32.9

## 2013-08-23 HISTORY — DX: Anxiety disorder, unspecified: F41.9

## 2013-08-23 HISTORY — DX: Depression, unspecified: F32.A

## 2013-08-23 LAB — CBC
HCT: 44.8 % (ref 36.0–46.0)
Hemoglobin: 15.6 g/dL — ABNORMAL HIGH (ref 12.0–15.0)
MCH: 31.3 pg (ref 26.0–34.0)
MCHC: 34.8 g/dL (ref 30.0–36.0)
MCV: 89.8 fL (ref 78.0–100.0)
Platelets: 201 10*3/uL (ref 150–400)
RBC: 4.99 MIL/uL (ref 3.87–5.11)
RDW: 12.9 % (ref 11.5–15.5)
WBC: 11.3 10*3/uL — ABNORMAL HIGH (ref 4.0–10.5)

## 2013-08-23 LAB — TYPE AND SCREEN
ABO/RH(D): O POS
Antibody Screen: NEGATIVE

## 2013-08-23 LAB — BASIC METABOLIC PANEL
BUN: 9 mg/dL (ref 6–23)
CO2: 32 mEq/L (ref 19–32)
Calcium: 9.9 mg/dL (ref 8.4–10.5)
Chloride: 95 mEq/L — ABNORMAL LOW (ref 96–112)
Creatinine, Ser: 0.83 mg/dL (ref 0.50–1.10)
GFR calc Af Amer: 86 mL/min — ABNORMAL LOW (ref >90)
GFR calc non Af Amer: 74 mL/min — ABNORMAL LOW (ref >90)
Glucose, Bld: 92 mg/dL (ref 70–99)
Potassium: 3.5 mEq/L — ABNORMAL LOW (ref 3.7–5.3)
Sodium: 140 mEq/L (ref 137–147)

## 2013-08-23 LAB — SURGICAL PCR SCREEN
MRSA, PCR: NEGATIVE
Staphylococcus aureus: NEGATIVE

## 2013-08-23 LAB — ABO/RH: ABO/RH(D): O POS

## 2013-08-23 NOTE — Progress Notes (Signed)
Ms Elizabeth Fowler denies seeing a cardiologist, but did [facility], and had an Echo and stress test. " It revealed a value flapper , but nothing to worry about, " no need for follow up. Patient denies chest pain or shortness of breath.

## 2013-08-24 NOTE — Progress Notes (Addendum)
Anesthesia Chart Review:  Patient is a 63 year old female scheduled for anterior L5-S1 fusion on 08/31/13 by Dr. Shon BatonBrooks with anterior exposure by Dr. Arbie CookeyEarly.  History includes former smoker, HTN, COPD, depression, anxiety, headaches, arthritis, cholecystectomy, hysterectomy, kidney surgery (not specified but reports multiple surgeries).  She reported what sound like mild valvular regurgitation on previous echo at [facility].  She reported she was told there was "nothing to worry about." PCP is Dr. Karleen HampshireWilliam McGough [facility] who cleared her for this procedure.  EKG on 08/23/13 showed SB @ 59 bpm, cannot rule out anterior infarct (age undetermined).  Reverse r wave progression in V2-3 is new since 02/03/04 EKG in [place]. No chest pain or SOB reported at her PAT visit. [facility] reported that prior cardiac testing was from 2006.  They will fax IF they are able to get records from storage.  Preoperative CXR and labs noted.   I'll review any additional records as available.   Elizabeth Ochsllison Rebeccah Ivins, PA-C [facility] Short Stay Center/Anesthesiology Phone [phone] 08/24/2013 5:16 PM  Addendum: 08/26/2013 9:30 AM [facility]  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  According to 03/27/04 office note by Dr. Kem Boroughsori Bradsher, her stress test then was negative for ischemia, but due to continued reports of chest pain she underwent cardiac cath on 03/20/04 that showed no significant CAD with normal LVF. Abdominal aortogram revealed widely patent renal arteries with a small infrarenal AAA, but no flow limiting lesions in the distal aorta.  However, she did have an abdominal ultrasound on 03/13/09 that showed no AAA.  I did notify Dr. Arbie CookeyEarly of previous notes mentioning AAA but later ultrasound showing normal abdominal aorta caliber.  He has viewed recent MRI films and feels her aortic size is okay to  proceed with surgery without further evaluation.

## 2013-08-26 ENCOUNTER — Encounter (HOSPITAL_COMMUNITY): Payer: Self-pay

## 2013-08-30 MED ORDER — VANCOMYCIN HCL IN DEXTROSE 1-5 GM/200ML-% IV SOLN
1000.0000 mg | Freq: Two times a day (BID) | INTRAVENOUS | Status: DC
  Administered 2013-08-31: 1000 mg via INTRAVENOUS
  Filled 2013-08-30: qty 200

## 2013-08-30 MED ORDER — ACETAMINOPHEN 10 MG/ML IV SOLN
1000.0000 mg | Freq: Four times a day (QID) | INTRAVENOUS | Status: DC
  Administered 2013-08-31: 1000 mg via INTRAVENOUS

## 2013-08-30 MED ORDER — DEXAMETHASONE SODIUM PHOSPHATE 4 MG/ML IJ SOLN
4.0000 mg | Freq: Once | INTRAMUSCULAR | Status: AC
  Administered 2013-08-31: 8 mg via INTRAVENOUS
  Filled 2013-08-30: qty 1

## 2013-08-31 ENCOUNTER — Ambulatory Visit (HOSPITAL_COMMUNITY): Payer: Medicaid Other

## 2013-08-31 ENCOUNTER — Inpatient Hospital Stay (HOSPITAL_COMMUNITY)
Admission: RE | Admit: 2013-08-31 | Discharge: 2013-09-02 | DRG: 460 | Disposition: A | Discharge status: Home / Self Care | Payer: Medicaid Other | Source: Ambulatory Visit | Attending: Orthopedic Surgery | Admitting: Orthopedic Surgery

## 2013-08-31 ENCOUNTER — Ambulatory Visit (HOSPITAL_COMMUNITY): Payer: Medicaid Other | Admitting: Anesthesiology

## 2013-08-31 ENCOUNTER — Encounter (HOSPITAL_COMMUNITY): Payer: Self-pay | Admitting: Surgery

## 2013-08-31 ENCOUNTER — Encounter (HOSPITAL_COMMUNITY): Payer: Medicaid Other | Admitting: Vascular Surgery

## 2013-08-31 ENCOUNTER — Encounter (HOSPITAL_COMMUNITY)
Admission: RE | Disposition: A | Discharge status: Home / Self Care | Payer: Self-pay | Source: Ambulatory Visit | Attending: Orthopedic Surgery

## 2013-08-31 DIAGNOSIS — M51379 Other intervertebral disc degeneration, lumbosacral region without mention of lumbar back pain or lower extremity pain: Secondary | ICD-10-CM

## 2013-08-31 DIAGNOSIS — Z981 Arthrodesis status: Secondary | ICD-10-CM

## 2013-08-31 DIAGNOSIS — F17200 Nicotine dependence, unspecified, uncomplicated: Secondary | ICD-10-CM | POA: Diagnosis present

## 2013-08-31 DIAGNOSIS — G8929 Other chronic pain: Secondary | ICD-10-CM | POA: Diagnosis present

## 2013-08-31 DIAGNOSIS — M1990 Unspecified osteoarthritis, unspecified site: Secondary | ICD-10-CM | POA: Diagnosis present

## 2013-08-31 DIAGNOSIS — M5137 Other intervertebral disc degeneration, lumbosacral region: Principal | ICD-10-CM | POA: Diagnosis present

## 2013-08-31 DIAGNOSIS — Z905 Acquired absence of kidney: Secondary | ICD-10-CM

## 2013-08-31 DIAGNOSIS — R030 Elevated blood-pressure reading, without diagnosis of hypertension: Secondary | ICD-10-CM | POA: Diagnosis present

## 2013-08-31 DIAGNOSIS — M810 Age-related osteoporosis without current pathological fracture: Secondary | ICD-10-CM | POA: Diagnosis present

## 2013-08-31 DIAGNOSIS — M549 Dorsalgia, unspecified: Secondary | ICD-10-CM | POA: Diagnosis present

## 2013-08-31 HISTORY — DX: Unspecified chronic bronchitis: J42

## 2013-08-31 HISTORY — DX: Nonrheumatic mitral (valve) prolapse: I34.1

## 2013-08-31 HISTORY — PX: ANTERIOR LUMBAR FUSION: SHX1170

## 2013-08-31 HISTORY — DX: Migraine, unspecified, not intractable, without status migrainosus: G43.909

## 2013-08-31 HISTORY — DX: Other chronic pain: G89.29

## 2013-08-31 HISTORY — DX: Pneumonia, unspecified organism: J18.9

## 2013-08-31 HISTORY — DX: Low back pain: M54.5

## 2013-08-31 HISTORY — DX: Low back pain, unspecified: M54.50

## 2013-08-31 HISTORY — PX: ABDOMINAL EXPOSURE: SHX5708

## 2013-08-31 HISTORY — DX: Cardiac murmur, unspecified: R01.1

## 2013-08-31 SURGERY — ANTERIOR LUMBAR FUSION 1 LEVEL
Anesthesia: General | Site: Spine Lumbar

## 2013-08-31 MED ORDER — HYDROMORPHONE HCL PF 1 MG/ML IJ SOLN
0.2500 mg | INTRAMUSCULAR | Status: DC | PRN
  Administered 2013-08-31 (×4): 0.5 mg via INTRAVENOUS

## 2013-08-31 MED ORDER — DIPHENHYDRAMINE HCL 50 MG/ML IJ SOLN: 12.5000 mg | Freq: Four times a day (QID) | INTRAMUSCULAR | Status: DC | PRN

## 2013-08-31 MED ORDER — DOCUSATE SODIUM 100 MG PO CAPS
100.0000 mg | ORAL_CAPSULE | Freq: Two times a day (BID) | ORAL | Status: DC
  Administered 2013-08-31 – 2013-09-02 (×4): 100 mg via ORAL
  Filled 2013-08-31 (×5): qty 1

## 2013-08-31 MED ORDER — HYDROMORPHONE HCL PF 1 MG/ML IJ SOLN
INTRAMUSCULAR | Status: AC
  Filled 2013-08-31: qty 1

## 2013-08-31 MED ORDER — ARTIFICIAL TEARS OP OINT
TOPICAL_OINTMENT | OPHTHALMIC | Status: AC
  Filled 2013-08-31: qty 3.5

## 2013-08-31 MED ORDER — ACETAMINOPHEN 10 MG/ML IV SOLN
INTRAVENOUS | Status: AC
  Filled 2013-08-31: qty 100

## 2013-08-31 MED ORDER — METHOCARBAMOL 100 MG/ML IJ SOLN
500.0000 mg | Freq: Four times a day (QID) | INTRAVENOUS | Status: DC | PRN
  Filled 2013-08-31: qty 5

## 2013-08-31 MED ORDER — LIDOCAINE HCL (CARDIAC) 20 MG/ML IV SOLN
INTRAVENOUS | Status: AC
  Filled 2013-08-31: qty 5

## 2013-08-31 MED ORDER — DIPHENHYDRAMINE HCL 12.5 MG/5ML PO ELIX: 12.5000 mg | ORAL_SOLUTION | Freq: Four times a day (QID) | ORAL | Status: DC | PRN

## 2013-08-31 MED ORDER — THROMBIN 20000 UNITS EX SOLR
CUTANEOUS | Status: AC
  Filled 2013-08-31: qty 20000

## 2013-08-31 MED ORDER — FENTANYL CITRATE 0.05 MG/ML IJ SOLN: 50.0000 ug | Freq: Once | INTRAMUSCULAR | Status: DC

## 2013-08-31 MED ORDER — OXYCODONE HCL 5 MG PO TABS
ORAL_TABLET | ORAL | Status: AC
  Filled 2013-08-31: qty 1

## 2013-08-31 MED ORDER — ROCURONIUM BROMIDE 100 MG/10ML IV SOLN
INTRAVENOUS | Status: DC | PRN
  Administered 2013-08-31: 50 mg via INTRAVENOUS

## 2013-08-31 MED ORDER — LIDOCAINE HCL (CARDIAC) 20 MG/ML IV SOLN
INTRAVENOUS | Status: DC | PRN
  Administered 2013-08-31: 80 mg via INTRAVENOUS

## 2013-08-31 MED ORDER — BUPIVACAINE-EPINEPHRINE PF 0.25-1:200000 % IJ SOLN
INTRAMUSCULAR | Status: DC | PRN
  Administered 2013-08-31: 30 mL

## 2013-08-31 MED ORDER — NEOSTIGMINE METHYLSULFATE 1 MG/ML IJ SOLN
INTRAMUSCULAR | Status: AC
  Filled 2013-08-31: qty 10

## 2013-08-31 MED ORDER — MIDAZOLAM HCL 2 MG/2ML IJ SOLN
1.0000 mg | INTRAMUSCULAR | Status: DC | PRN
  Administered 2013-08-31: 2 mg via INTRAVENOUS

## 2013-08-31 MED ORDER — PROPOFOL 10 MG/ML IV BOLUS
INTRAVENOUS | Status: AC
  Filled 2013-08-31: qty 20

## 2013-08-31 MED ORDER — OXYCODONE HCL 5 MG/5ML PO SOLN: 5.0000 mg | Freq: Once | ORAL | Status: AC | PRN

## 2013-08-31 MED ORDER — SODIUM CHLORIDE 0.9 % IJ SOLN
3.0000 mL | Freq: Two times a day (BID) | INTRAMUSCULAR | Status: DC
  Administered 2013-08-31 – 2013-09-01 (×3): 3 mL via INTRAVENOUS

## 2013-08-31 MED ORDER — ROCURONIUM BROMIDE 50 MG/5ML IV SOLN
INTRAVENOUS | Status: AC
  Filled 2013-08-31: qty 1

## 2013-08-31 MED ORDER — STERILE WATER FOR INJECTION IJ SOLN
INTRAMUSCULAR | Status: AC
  Filled 2013-08-31: qty 10

## 2013-08-31 MED ORDER — THROMBIN 20000 UNITS EX SOLR
CUTANEOUS | Status: DC | PRN
  Administered 2013-08-31: 11:00:00 via TOPICAL

## 2013-08-31 MED ORDER — VECURONIUM BROMIDE 10 MG IV SOLR
INTRAVENOUS | Status: DC | PRN
  Administered 2013-08-31: 3 mg via INTRAVENOUS
  Administered 2013-08-31: 2 mg via INTRAVENOUS

## 2013-08-31 MED ORDER — LACTATED RINGERS IV SOLN: INTRAVENOUS | Status: DC

## 2013-08-31 MED ORDER — VECURONIUM BROMIDE 10 MG IV SOLR
INTRAVENOUS | Status: AC
  Filled 2013-08-31: qty 10

## 2013-08-31 MED ORDER — PHENOL 1.4 % MT LIQD: 1.0000 | OROMUCOSAL | Status: DC | PRN

## 2013-08-31 MED ORDER — FENTANYL CITRATE 0.05 MG/ML IJ SOLN
INTRAMUSCULAR | Status: AC
  Filled 2013-08-31: qty 5

## 2013-08-31 MED ORDER — ALPRAZOLAM 1 MG PO TABS: 1.0000 mg | ORAL_TABLET | Freq: Two times a day (BID) | ORAL | Status: DC | PRN

## 2013-08-31 MED ORDER — VANCOMYCIN HCL IN DEXTROSE 1-5 GM/200ML-% IV SOLN
1000.0000 mg | Freq: Two times a day (BID) | INTRAVENOUS | Status: AC
  Administered 2013-08-31 – 2013-09-01 (×2): 1000 mg via INTRAVENOUS
  Filled 2013-08-31 (×2): qty 200

## 2013-08-31 MED ORDER — SODIUM CHLORIDE 0.9 % IJ SOLN: 9.0000 mL | INTRAMUSCULAR | Status: DC | PRN

## 2013-08-31 MED ORDER — SUCCINYLCHOLINE CHLORIDE 20 MG/ML IJ SOLN
INTRAMUSCULAR | Status: AC
  Filled 2013-08-31: qty 1

## 2013-08-31 MED ORDER — OXYCODONE HCL 5 MG PO TABS
10.0000 mg | ORAL_TABLET | ORAL | Status: DC | PRN
  Administered 2013-09-01 – 2013-09-02 (×4): 10 mg via ORAL
  Filled 2013-08-31 (×4): qty 2

## 2013-08-31 MED ORDER — MIDAZOLAM HCL 5 MG/5ML IJ SOLN
INTRAMUSCULAR | Status: DC | PRN
  Administered 2013-08-31: 2 mg via INTRAVENOUS

## 2013-08-31 MED ORDER — FENTANYL CITRATE 0.05 MG/ML IJ SOLN
INTRAMUSCULAR | Status: DC | PRN
Start: 2013-08-31 — End: 2013-08-31
  Administered 2013-08-31: 100 ug via INTRAVENOUS
  Administered 2013-08-31 (×2): 50 ug via INTRAVENOUS
  Administered 2013-08-31: 100 ug via INTRAVENOUS
  Administered 2013-08-31 (×4): 50 ug via INTRAVENOUS

## 2013-08-31 MED ORDER — GABAPENTIN 300 MG PO CAPS
300.0000 mg | ORAL_CAPSULE | Freq: Three times a day (TID) | ORAL | Status: DC
  Administered 2013-08-31 – 2013-09-02 (×6): 300 mg via ORAL
  Filled 2013-08-31 (×9): qty 1

## 2013-08-31 MED ORDER — ACETAMINOPHEN 10 MG/ML IV SOLN
1000.0000 mg | Freq: Four times a day (QID) | INTRAVENOUS | Status: AC
  Administered 2013-08-31 – 2013-09-01 (×3): 1000 mg via INTRAVENOUS
  Filled 2013-08-31 (×4): qty 100

## 2013-08-31 MED ORDER — ONDANSETRON HCL 4 MG/2ML IJ SOLN: 4.0000 mg | Freq: Four times a day (QID) | INTRAMUSCULAR | Status: DC | PRN

## 2013-08-31 MED ORDER — DEXAMETHASONE SODIUM PHOSPHATE 4 MG/ML IJ SOLN
4.0000 mg | Freq: Four times a day (QID) | INTRAMUSCULAR | Status: DC
  Administered 2013-09-01 (×2): 4 mg via INTRAVENOUS
  Filled 2013-08-31 (×11): qty 1

## 2013-08-31 MED ORDER — PROMETHAZINE HCL 25 MG/ML IJ SOLN: 6.2500 mg | INTRAMUSCULAR | Status: DC | PRN

## 2013-08-31 MED ORDER — ALPRAZOLAM 0.5 MG PO TABS: 1.0000 mg | ORAL_TABLET | Freq: Two times a day (BID) | ORAL | Status: DC | PRN

## 2013-08-31 MED ORDER — EPHEDRINE SULFATE 50 MG/ML IJ SOLN
INTRAMUSCULAR | Status: DC | PRN
  Administered 2013-08-31: 5 mg via INTRAVENOUS
  Administered 2013-08-31: 10 mg via INTRAVENOUS
  Administered 2013-08-31: 5 mg via INTRAVENOUS

## 2013-08-31 MED ORDER — NEOSTIGMINE METHYLSULFATE 1 MG/ML IJ SOLN
INTRAMUSCULAR | Status: DC | PRN
  Administered 2013-08-31: 5 mg via INTRAVENOUS

## 2013-08-31 MED ORDER — GLYCOPYRROLATE 0.2 MG/ML IJ SOLN
INTRAMUSCULAR | Status: AC
  Filled 2013-08-31: qty 3

## 2013-08-31 MED ORDER — 0.9 % SODIUM CHLORIDE (POUR BTL) OPTIME
TOPICAL | Status: DC | PRN
  Administered 2013-08-31: 1000 mL

## 2013-08-31 MED ORDER — NALOXONE HCL 0.4 MG/ML IJ SOLN: 0.4000 mg | INTRAMUSCULAR | Status: DC | PRN

## 2013-08-31 MED ORDER — HEMOSTATIC AGENTS (NO CHARGE) OPTIME
TOPICAL | Status: DC | PRN
  Administered 2013-08-31: 1 via TOPICAL

## 2013-08-31 MED ORDER — ENOXAPARIN SODIUM 40 MG/0.4ML ~~LOC~~ SOLN
40.0000 mg | SUBCUTANEOUS | Status: DC
  Administered 2013-09-01 – 2013-09-02 (×2): 40 mg via SUBCUTANEOUS
  Filled 2013-08-31 (×3): qty 0.4

## 2013-08-31 MED ORDER — MENTHOL 3 MG MT LOZG: 1.0000 | LOZENGE | OROMUCOSAL | Status: DC | PRN

## 2013-08-31 MED ORDER — ONDANSETRON HCL 4 MG/2ML IJ SOLN
4.0000 mg | INTRAMUSCULAR | Status: DC | PRN
Start: 2013-08-31 — End: 2013-09-02

## 2013-08-31 MED ORDER — METHOCARBAMOL 500 MG PO TABS
500.0000 mg | ORAL_TABLET | Freq: Four times a day (QID) | ORAL | Status: DC | PRN
  Administered 2013-08-31 – 2013-09-02 (×2): 500 mg via ORAL
  Filled 2013-08-31 (×2): qty 1

## 2013-08-31 MED ORDER — SODIUM CHLORIDE 0.9 % IV SOLN: 250.0000 mL | INTRAVENOUS | Status: DC

## 2013-08-31 MED ORDER — DEXAMETHASONE 4 MG PO TABS
4.0000 mg | ORAL_TABLET | Freq: Four times a day (QID) | ORAL | Status: DC
  Administered 2013-08-31 – 2013-09-02 (×5): 4 mg via ORAL
  Filled 2013-08-31 (×11): qty 1

## 2013-08-31 MED ORDER — ARTIFICIAL TEARS OP OINT
TOPICAL_OINTMENT | OPHTHALMIC | Status: DC | PRN
Start: 2013-08-31 — End: 2013-08-31
  Administered 2013-08-31: 1 via OPHTHALMIC

## 2013-08-31 MED ORDER — GLYCOPYRROLATE 0.2 MG/ML IJ SOLN
INTRAMUSCULAR | Status: DC | PRN
  Administered 2013-08-31: 0.6 mg via INTRAVENOUS

## 2013-08-31 MED ORDER — ONDANSETRON HCL 4 MG/2ML IJ SOLN
INTRAMUSCULAR | Status: AC
  Filled 2013-08-31: qty 2

## 2013-08-31 MED ORDER — FLEET ENEMA 7-19 GM/118ML RE ENEM: 1.0000 | ENEMA | Freq: Once | RECTAL | Status: AC | PRN

## 2013-08-31 MED ORDER — MIDAZOLAM HCL 2 MG/2ML IJ SOLN
INTRAMUSCULAR | Status: AC
  Filled 2013-08-31: qty 2

## 2013-08-31 MED ORDER — EPHEDRINE SULFATE 50 MG/ML IJ SOLN
INTRAMUSCULAR | Status: AC
  Filled 2013-08-31: qty 1

## 2013-08-31 MED ORDER — DEXTROSE 5 % IV SOLN
INTRAVENOUS | Status: DC | PRN
  Administered 2013-08-31: 08:00:00 via INTRAVENOUS

## 2013-08-31 MED ORDER — OXYCODONE HCL 5 MG PO TABS
5.0000 mg | ORAL_TABLET | Freq: Once | ORAL | Status: AC | PRN
  Administered 2013-08-31: 5 mg via ORAL

## 2013-08-31 MED ORDER — BUPIVACAINE-EPINEPHRINE (PF) 0.25% -1:200000 IJ SOLN
INTRAMUSCULAR | Status: AC
Start: 2013-08-31 — End: 2013-08-31
  Filled 2013-08-31: qty 30

## 2013-08-31 MED ORDER — LACTATED RINGERS IV SOLN
INTRAVENOUS | Status: DC | PRN
  Administered 2013-08-31 (×3): via INTRAVENOUS

## 2013-08-31 MED ORDER — MORPHINE SULFATE (PF) 1 MG/ML IV SOLN
INTRAVENOUS | Status: DC
  Administered 2013-08-31: 13:00:00 via INTRAVENOUS
  Administered 2013-08-31: 1 mg via INTRAVENOUS
  Administered 2013-08-31: 12 mg via INTRAVENOUS
  Administered 2013-09-01: 5 mg via INTRAVENOUS
  Administered 2013-09-01: 5.62 mg via INTRAVENOUS
  Administered 2013-09-01: 3 mg via INTRAVENOUS
  Filled 2013-08-31: qty 25

## 2013-08-31 MED ORDER — SODIUM CHLORIDE 0.9 % IJ SOLN
3.0000 mL | INTRAMUSCULAR | Status: DC | PRN
Start: 2013-08-31 — End: 2013-09-02

## 2013-08-31 MED ORDER — PROPOFOL 10 MG/ML IV BOLUS
INTRAVENOUS | Status: DC | PRN
Start: 2013-08-31 — End: 2013-08-31
  Administered 2013-08-31: 180 mg via INTRAVENOUS

## 2013-08-31 MED ORDER — ONDANSETRON HCL 4 MG/2ML IJ SOLN
INTRAMUSCULAR | Status: DC | PRN
  Administered 2013-08-31: 4 mg via INTRAVENOUS

## 2013-08-31 MED ORDER — DEXAMETHASONE SODIUM PHOSPHATE 4 MG/ML IJ SOLN
INTRAMUSCULAR | Status: AC
  Filled 2013-08-31: qty 3

## 2013-08-31 SURGICAL SUPPLY — 101 items
ADH SKN CLS APL DERMABOND .7 (GAUZE/BANDAGES/DRESSINGS) ×4
APPLIER CLIP 11 MED OPEN (CLIP) ×4
APR CLP MED 11 20 MLT OPN (CLIP) ×2
BLADE SURG 10 STRL SS (BLADE) ×4 IMPLANT
BLADE SURG ROTATE 9660 (MISCELLANEOUS) IMPLANT
CLIP APPLIE 11 MED OPEN (CLIP) ×2 IMPLANT
CLIP LIGATING EXTRA MED SLVR (CLIP) ×4 IMPLANT
CLIP LIGATING EXTRA SM BLUE (MISCELLANEOUS) ×4 IMPLANT
CLOSURE STERI-STRIP 1/2X4 (GAUZE/BANDAGES/DRESSINGS) ×1
CLOSURE WOUND 1/2 X4 (GAUZE/BANDAGES/DRESSINGS)
CLOTH BEACON ORANGE TIMEOUT ST (SAFETY) ×2 IMPLANT
CLSR STERI-STRIP ANTIMIC 1/2X4 (GAUZE/BANDAGES/DRESSINGS) ×1 IMPLANT
CORDS BIPOLAR (ELECTRODE) ×4 IMPLANT
COVER SURGICAL LIGHT HANDLE (MISCELLANEOUS) ×8 IMPLANT
DERMABOND ADVANCED (GAUZE/BANDAGES/DRESSINGS) ×4
DERMABOND ADVANCED .7 DNX12 (GAUZE/BANDAGES/DRESSINGS) ×4 IMPLANT
DRAPE C-ARM 42X72 X-RAY (DRAPES) ×8 IMPLANT
DRAPE INCISE IOBAN 66X45 STRL (DRAPES) IMPLANT
DRAPE POUCH INSTRU U-SHP 10X18 (DRAPES) ×4 IMPLANT
DRAPE SURG 17X23 STRL (DRAPES) ×4 IMPLANT
DRAPE U-SHAPE 47X51 STRL (DRAPES) ×4 IMPLANT
DRSG MEPILEX BORDER 4X8 (GAUZE/BANDAGES/DRESSINGS) ×4 IMPLANT
DURAPREP 26ML APPLICATOR (WOUND CARE) ×4 IMPLANT
ELECT BLADE 4.0 EZ CLEAN MEGAD (MISCELLANEOUS) ×4
ELECT CAUTERY BLADE 6.4 (BLADE) ×4 IMPLANT
ELECT REM PT RETURN 9FT ADLT (ELECTROSURGICAL) ×4
ELECTRODE BLDE 4.0 EZ CLN MEGD (MISCELLANEOUS) ×2 IMPLANT
ELECTRODE REM PT RTRN 9FT ADLT (ELECTROSURGICAL) ×2 IMPLANT
GAUZE SPONGE 4X4 16PLY XRAY LF (GAUZE/BANDAGES/DRESSINGS) IMPLANT
GLOVE BIOGEL PI IND STRL 8 (GLOVE) ×2 IMPLANT
GLOVE BIOGEL PI IND STRL 8.5 (GLOVE) ×4 IMPLANT
GLOVE BIOGEL PI INDICATOR 8 (GLOVE) ×2
GLOVE BIOGEL PI INDICATOR 8.5 (GLOVE) ×4
GLOVE ECLIPSE 8.5 STRL (GLOVE) ×8 IMPLANT
GLOVE ORTHO TXT STRL SZ7.5 (GLOVE) ×4 IMPLANT
GLOVE SS BIOGEL STRL SZ 7.5 (GLOVE) ×2 IMPLANT
GLOVE SUPERSENSE BIOGEL SZ 7.5 (GLOVE) ×2
GOWN STRL NON-REIN LRG LVL3 (GOWN DISPOSABLE) ×4 IMPLANT
GOWN STRL REIN 2XL XLG LVL4 (GOWN DISPOSABLE) ×4 IMPLANT
GOWN STRL REUS W/ TWL LRG LVL3 (GOWN DISPOSABLE) ×6 IMPLANT
GOWN STRL REUS W/TWL 2XL LVL3 (GOWN DISPOSABLE) ×8 IMPLANT
GOWN STRL REUS W/TWL LRG LVL3 (GOWN DISPOSABLE) ×12
GRANULES STERILE 5CC (Bone Implant) ×2 IMPLANT
HEMOSTAT SNOW SURGICEL 2X4 (HEMOSTASIS) IMPLANT
INSERT FOGARTY 61MM (MISCELLANEOUS) IMPLANT
INSERT FOGARTY SM (MISCELLANEOUS) IMPLANT
INTERPLATE 39X14X12 (Plate) ×4 IMPLANT
KIT BASIN OR (CUSTOM PROCEDURE TRAY) ×4 IMPLANT
KIT ROOM TURNOVER OR (KITS) ×8 IMPLANT
LOOP VESSEL MAXI BLUE (MISCELLANEOUS) IMPLANT
LOOP VESSEL MINI RED (MISCELLANEOUS) IMPLANT
NDL ASP BONE MRW 11GX15 J (NEEDLE) IMPLANT
NDL SPNL 18GX3.5 QUINCKE PK (NEEDLE) ×2 IMPLANT
NEEDLE ASP BONE MRW 11GX15 J (NEEDLE) ×4 IMPLANT
NEEDLE SPNL 18GX3.5 QUINCKE PK (NEEDLE) ×4 IMPLANT
NS IRRIG 1000ML POUR BTL (IV SOLUTION) ×6 IMPLANT
PACK LAMINECTOMY ORTHO (CUSTOM PROCEDURE TRAY) ×4 IMPLANT
PACK UNIVERSAL I (CUSTOM PROCEDURE TRAY) ×4 IMPLANT
PAD ARMBOARD 7.5X6 YLW CONV (MISCELLANEOUS) ×16 IMPLANT
PEEK SPACER INTERPLAT 35X14X12 (Peek) ×2 IMPLANT
PLATE SPINAL INTERPLT 39X14X12 (Plate) IMPLANT
PUTTY BONE DBX 2.5 MIS (Bone Implant) ×2 IMPLANT
SCREW BONE STANDARD (Screw) ×12 IMPLANT
SCREW BONE STD (Screw) IMPLANT
SCREW SPINAL STD (Orthopedic Implant) ×2 IMPLANT
SPONGE INTESTINAL PEANUT (DISPOSABLE) ×12 IMPLANT
SPONGE LAP 18X18 X RAY DECT (DISPOSABLE) IMPLANT
SPONGE LAP 4X18 X RAY DECT (DISPOSABLE) IMPLANT
SPONGE SURGIFOAM ABS GEL 100 (HEMOSTASIS) IMPLANT
STAPLER VISISTAT 35W (STAPLE) IMPLANT
STRIP CLOSURE SKIN 1/2X4 (GAUZE/BANDAGES/DRESSINGS) IMPLANT
SURGIFLO TRUKIT (HEMOSTASIS) IMPLANT
SUT MNCRL AB 4-0 PS2 18 (SUTURE) ×4 IMPLANT
SUT MON AB 3-0 SH 27 (SUTURE) ×8
SUT MON AB 3-0 SH27 (SUTURE) ×2 IMPLANT
SUT PDS AB 1 CTX 36 (SUTURE) ×4 IMPLANT
SUT PROLENE 4 0 RB 1 (SUTURE) ×16
SUT PROLENE 4-0 RB1 .5 CRCL 36 (SUTURE) ×8 IMPLANT
SUT PROLENE 5 0 C 1 24 (SUTURE) IMPLANT
SUT PROLENE 5 0 CC1 (SUTURE) IMPLANT
SUT PROLENE 6 0 C 1 30 (SUTURE) ×4 IMPLANT
SUT PROLENE 6 0 CC (SUTURE) IMPLANT
SUT SILK 0 TIES 10X30 (SUTURE) ×4 IMPLANT
SUT SILK 2 0 TIES 10X30 (SUTURE) ×8 IMPLANT
SUT SILK 2 0SH CR/8 30 (SUTURE) IMPLANT
SUT SILK 3 0 TIES 10X30 (SUTURE) ×8 IMPLANT
SUT SILK 3 0SH CR/8 30 (SUTURE) IMPLANT
SUT VIC AB 0 CT1 27 (SUTURE) ×4
SUT VIC AB 0 CT1 27XBRD ANBCTR (SUTURE) ×2 IMPLANT
SUT VIC AB 1 CT1 27 (SUTURE) ×8
SUT VIC AB 1 CT1 27XBRD ANBCTR (SUTURE) ×4 IMPLANT
SUT VIC AB 2-0 CT1 18 (SUTURE) ×4 IMPLANT
SUT VIC AB 2-0 CT1 36 (SUTURE) ×4 IMPLANT
SUT VIC AB 3-0 SH 27 (SUTURE) ×4
SUT VIC AB 3-0 SH 27X BRD (SUTURE) ×2 IMPLANT
SYR BULB IRRIGATION 50ML (SYRINGE) ×4 IMPLANT
SYR CONTROL 10ML LL (SYRINGE) ×2 IMPLANT
TOWEL OR 17X24 6PK STRL BLUE (TOWEL DISPOSABLE) ×8 IMPLANT
TOWEL OR 17X26 10 PK STRL BLUE (TOWEL DISPOSABLE) ×8 IMPLANT
TRAY FOLEY CATH 14FRSI W/METER (CATHETERS) ×4 IMPLANT
WATER STERILE IRR 1000ML POUR (IV SOLUTION) ×2 IMPLANT

## 2013-08-31 NOTE — Anesthesia Postprocedure Evaluation (Signed)
  Anesthesia Post-op Note  Patient: Elizabeth Fowler  Procedure(s) Performed: Procedure(s): ALIF L5 - S1 1 LEVEL (N/A) ABDOMINAL EXPOSURE (N/A)  Patient Location: PACU  Anesthesia Type:General  Level of Consciousness: awake and alert   Airway and Oxygen Therapy: Patient Spontanous Breathing  Post-op Pain: mild  Post-op Assessment: Post-op Vital signs reviewed, Patient's Cardiovascular Status Stable, Respiratory Function Stable, Patent Airway, No signs of Nausea or vomiting and Pain level controlled  Post-op Vital Signs: Reviewed and stable  Complications: No apparent anesthesia complications

## 2013-08-31 NOTE — Progress Notes (Signed)
Dr. Shon BatonBrooks updated. Pt moving feet well . Legs slowly able to lift knees. Slight numbness and tingling in legs but able to feel light sensation. Pain more controlled. Using PCA

## 2013-08-31 NOTE — Progress Notes (Signed)
During Pre-op Nurse asked patient if she placed TED hose on bilateral lower extremities. Patient sated "No I'm not going to wear them because they are too tight. I had them once and they were to tight. If they put them on me while I'm in the OR that's ok but as soon as I wake up I'm taking them off." Nurse educated patient on the importance of wearing the TEDs and reminded patient that she was measured in PAT so the TED hose should not be to tight, and patient stated "I know, but I'm not going to wear them." Husband at chair side and stated "you aren't going to argue with her?" Nurse then informed patient and husband that I don't argue with patients. My job is to simply explain the importance of the TED hose, however if the patient refuses then there is nothing else I can do. Patient thanked Engineer, civil (consulting)urse. TED hose placed on chart.

## 2013-08-31 NOTE — Anesthesia Procedure Notes (Signed)
Procedure Name: Intubation Date/Time: 08/31/2013 8:47 AM Performed by: Wray KearnsFOLEY, [place]ia Lintner A Pre-anesthesia Checklist: Patient identified, Timeout performed, Emergency Drugs available, Suction available and Patient being monitored Patient Re-evaluated:Patient Re-evaluated prior to inductionOxygen Delivery Method: Circle system utilized Preoxygenation: Pre-oxygenation with 100% oxygen Intubation Type: IV induction and Cricoid Pressure applied Ventilation: Mask ventilation without difficulty Laryngoscope Size: Mac and 3 Grade View: Grade I Tube type: Oral Tube size: 7.5 mm Number of attempts: 1 Airway Equipment and Method: Stylet Placement Confirmation: ETT inserted through vocal cords under direct vision,  breath sounds checked- equal and bilateral and positive ETCO2 Secured at: 21 cm Tube secured with: Tape Dental Injury: Teeth and Oropharynx as per pre-operative assessment

## 2013-08-31 NOTE — Anesthesia Preprocedure Evaluation (Signed)
Anesthesia Evaluation  Patient identified by MRN, date of birth, ID band Patient awake    Reviewed: Allergy & Precautions, H&P , NPO status , Patient's Chart, lab work & pertinent test results  History of Anesthesia Complications (+) PONV  Airway Mallampati: I TM Distance: >3 FB Neck ROM: Full    Dental   Pulmonary COPDformer smoker,  + rhonchi         Cardiovascular hypertension, Rhythm:Regular Rate:Normal     Neuro/Psych  Headaches, Anxiety Depression LE weakness bilat    GI/Hepatic   Endo/Other    Renal/GU      Musculoskeletal   Abdominal   Peds  Hematology   Anesthesia Other Findings   Reproductive/Obstetrics                           Anesthesia Physical Anesthesia Plan  ASA: III  Anesthesia Plan: General   Post-op Pain Management:    Induction: Intravenous  Airway Management Planned: Oral ETT  Additional Equipment: Arterial line  Intra-op Plan:   Post-operative Plan: Extubation in OR  Informed Consent: I have reviewed the patients History and Physical, chart, labs and discussed the procedure including the risks, benefits and alternatives for the proposed anesthesia with the patient or authorized representative who has indicated his/her understanding and acceptance.     Plan Discussed with: CRNA and Surgeon  Anesthesia Plan Comments:         Anesthesia Quick Evaluation

## 2013-08-31 NOTE — Op Note (Signed)
    OPERATIVE REPORT  DATE OF SURGERY: 08/31/2013  PATIENT: Elizabeth Fowler, 63 y.o. female MRN: [id number]  DOB: [birth year]  PRE-OPERATIVE DIAGNOSIS: Degenerative disc disease L5-S1  POST-OPERATIVE DIAGNOSIS:  Same  PROCEDURE: Anterior exposure for L5-S1ALIF  SURGEON:  Gretta Beganodd Robinette Esters, [name].  Co-surgeon for the exposure: Dr. Shon BatonBrooks  ANESTHESIA:  Gen.  EBL: Less than 100 ml  Total I/O In: 2700 [I.V.:2700] Out: 1300 [Urine:1000; Blood:300]  BLOOD ADMINISTERED: None  DRAINS: None  SPECIMEN: Disc  COUNTS CORRECT:  YES  PLAN OF CARE: PACU   PATIENT DISPOSITION:  PACU - hemodynamically stable  PROCEDURE DETAILS: Patient is a 63 year old female with progressively severe degenerative disc disease. She was seen by Dr. Shon BatonBrooks he felt anterior exposure with the most appropriate treatment. I saw her preoperatively. She is thin. Mildly concern was regarding her multiple prior abdominal operations. Details of this are unavailable and this was back in the 1970s infiltrate hospital. There was some question about left ureter injury and repair also some question about a prior nephrectomy. I explained to her that if this may also difficulty with scarring. She understands and was taken to the operating room for exposure  The patient's abdomen was prepped and draped in usual sterile fashion. Lateral C-arm projection revealed the level of the L5-S1 disc on the skin. Incision was made transversely in the left lower quadrant over this area. This was carried down through the subcutaneous fat and the fat was mobilized off the anterior rectus sheath. Anterior rectus sheath was opened in line with the skin incision and the rectus muscle was mobilized. The retroperitoneum was entered laterally and the peritoneal contents were swept to the right. The peritoneum was not entered. There was extensive scarring over the psoas muscle and the patient iliac artery was exposed. The external iliac extender Prolene suture  on it. The artery was small and thickened. The exposure was made by over tracking the left iliac artery and vein to the left. The discs was identified and the L5-S1 disc was mobilized. The middle sacral vessels were clipped and divided for better mobilization. Blunt dissection was used to continue to mobilize the exposure for the disc space. The Thompson retractor was brought into the field and the reverse lip 150 blade was positioned to the right and left of the L5-S1 disc. The malleable retractors were placed superiorly and inferiorly for exposure. C-arm was brought back in the field to confirm that this was the L5-S1 disc. The remainder of the procedure including disc fusion and closure will be dictated as a separate note by Dr. [name]   [name], [name]. 08/31/2013 11:48 AM

## 2013-08-31 NOTE — Brief Op Note (Signed)
08/31/2013  11:48 AM  PATIENT:  Pilar JarvisWanda H Mraz  63 y.o. female  PRE-OPERATIVE DIAGNOSIS:  Grade 1 and 2 Slip with DDD  L5-S1  POST-OPERATIVE DIAGNOSIS:  Grade 1 and 2 Slip with DDD  L5-S1  PROCEDURE:  Procedure(s): ALIF L5 - S1 1 LEVEL (N/A) ABDOMINAL EXPOSURE (N/A)  SURGEON:  Surgeon(s) and Role: Panel 1:    * Venita Lickahari Margarethe Virgen, MD - Primary  Panel 2:    * Larina Earthlyodd F Early, MD - Primary  PHYSICIAN ASSISTANT:   ASSISTANTS: Zonia KiefJames Owens   ANESTHESIA:   general  EBL:  Total I/O In: 2700 [I.V.:2700] Out: 1300 [Urine:1000; Blood:300]  BLOOD ADMINISTERED:none  DRAINS: none   LOCAL MEDICATIONS USED:  MARCAINE     SPECIMEN:  No Specimen  DISPOSITION OF SPECIMEN:  N/A  COUNTS:  YES  TOURNIQUET:  * No tourniquets in log *  DICTATION: .Other Dictation: Dictation Number [id number]  PLAN OF CARE: Admit to inpatient   PATIENT DISPOSITION:  PACU - hemodynamically stable.

## 2013-08-31 NOTE — Transfer of Care (Signed)
Immediate Anesthesia Transfer of Care Note  Patient: Elizabeth JarvisWanda H Fowler  Procedure(s) Performed: Procedure(s): ALIF L5 - S1 1 LEVEL (N/A) ABDOMINAL EXPOSURE (N/A)  Patient Location: PACU  Anesthesia Type:General  Level of Consciousness: sedated, patient cooperative and responds to stimulation  Airway & Oxygen Therapy: Patient Spontanous Breathing and Patient connected to nasal cannula oxygen  Post-op Assessment: Report given to PACU RN, Post -op Vital signs reviewed and stable, Patient moving all extremities and Patient moving all extremities X 4  Post vital signs: Reviewed and stable  Complications: No apparent anesthesia complications

## 2013-08-31 NOTE — Progress Notes (Signed)
Utilization review completed.  

## 2013-08-31 NOTE — Progress Notes (Signed)
Dr. Shon BatonBrooks at bedside in PACU . Pt not following commands,opening eyes or moving extremities as yet.  Grimacing as if in pain .

## 2013-08-31 NOTE — Preoperative (Signed)
Beta Blockers   Reason not to administer Beta Blockers:Not Applicable 

## 2013-08-31 NOTE — H&P (Signed)
No change in clinical exam H+P reviewed  

## 2013-09-01 ENCOUNTER — Inpatient Hospital Stay (HOSPITAL_COMMUNITY): Payer: Medicaid Other

## 2013-09-01 ENCOUNTER — Encounter (HOSPITAL_COMMUNITY): Payer: Self-pay | Admitting: Orthopedic Surgery

## 2013-09-01 DIAGNOSIS — Z9889 Other specified postprocedural states: Secondary | ICD-10-CM

## 2013-09-01 NOTE — Op Note (Signed)
Elizabeth Fowler, Elizabeth Fowler                 ACCOUNT NO.:  [id number]  MEDICAL RECORD NO.:  [id number]  LOCATION:  [id number]                        FACILITY:  [facility]  PHYSICIAN:  Alvy Beal, MD    DATE OF BIRTH:  Aug 13, 1950  DATE OF PROCEDURE:  08/31/2013 DATE OF DISCHARGE:                              OPERATIVE REPORT   PREOPERATIVE DIAGNOSIS:  Degenerative lumbar disc disease, L5-S1.  POSTOPERATIVE DIAGNOSIS:  Degenerative lumbar disc disease, L5-S1.  OPERATIVE PROCEDURE:  Anterior lumbar interbody fusion with anterior lumbar 0 profile instrumentation.  FIRST ASSISTANT:  Genene Churn. Barry Dienes, PA-C.  THIRD SURGEON:  Larina Earthly, [name].  HISTORY:  This is a very pleasant young woman, who has been having progressive debilitating back pain.  Despite longstanding, long-term conservative management, she continued to have deterioration in her quality of life and pain.  As a result, we elected to proceed with surgery.  All appropriate risks, benefits, and alternatives were discussed with the patient and consent was obtained.  OPERATIVE NOTE:  The patient was brought to the operating room, placed supine on the operating table.  After successful induction of general anesthesia and endotracheal intubation, SCDs and a Foley were inserted. She was placed supine on the operating room table and the abdomen was prepped and draped in a standard fashion.  Please refer to Dr. Lacinda Axon dictation for specifics for the approach.  It should be noted that prior to the incision, a time-out was done with Dr. Arbie Cookey and myself in the room confirming the patient, procedure, and all other pertinent important data.  The instrumentation system used by the way was the RSV inner plate with a size 14, 12-degree lordotic spacer packed with DBX mix and Chronos.  Once the approach was completed by Dr. Arbie Cookey and the retractors were positioned, he contacted me, and I scrubbed back into the case.  We then took x-ray and  confirmed that we were at the appropriate level at L5-S1. Once this was done, I then performed an annulotomy with a 10 blade scalpel.  I used a combination of pituitary rongeurs, and various curettes to remove the bulk of the disc material.  Once this was done, I used sequential dilating devices to dilate up the interbody space and continue my discectomy.  Using curettes, I was able to scrap out all of the disc material from the L5-S1 intervertebral space.  I then used a fine angled curette to release the posterior annulus from the vertebral bodies of L5 and S1.  I could then take a 2 mm Kerrison, freely behind the vertebral bodies and trim down the osteophytes.  At this point, I was very pleased with my decompression.  I rasped the endplates to ensure I had bleeding subchondral bone.  Once that was done, I then trialed a 14, 12-degree lordotic cage, and this provided excellent fixation.  I then obtained the actual implant, packed it with the appropriate bone graft, which was aspirated of the S1 vertebral body mixed with Chronos and DBX mix.  I then irrigated copiously with normal saline and then malleted the cage into the proper position.  I then inserted the anterior plate 0 profile plate  and secured it with appropriate size screws.  The Anti-lockout plate was then secured over that and torqued appropriately.  I irrigated the wound copiously with normal saline.  Final x-rays were satisfactory.  Hardware and graft were in good position.  Once the decompression discectomy and fusion was complete, I then removed the retractors sequentially and ensured I had hemostasis.  I then closed the fascia of the rectus with a running 0 Prolene and then superficial with 2-0 Vicryl sutures and a 3-0 Monocryl for the skin. Steri-Strips and dry dressing were applied.  Final AP view of the abdomen was taken, which was satisfactory.  There is no evidence to suggest retained fragments of surgical  equipment.  No drains were placed.  Dry dressings were applied and the patient was ultimately extubated and transferred to the PACU without incident.  At the end of the case, all needle and sponge counts were correct.  First assistant, Zonia KiefJames Owens, is instrumental in providing assistance to Dr. Arbie CookeyEarly for the approach, retractor holding and visualization during the decompression, removal of irrigation, suction and assistance with wound closure.     Alvy Bealahari D Jenee Spaugh, MD     DDB/MEDQ  D:  08/31/2013  T:  09/01/2013  Job:  [id number]  cc:   Larina Earthlyodd F. Early, [name].

## 2013-09-01 NOTE — Progress Notes (Signed)
Patient ID: Elizabeth Fowler, female   DOB: 10/24/1950, 63 y.o.   MRN: [id number] Looks great this morning. Some hoarseness probably from the ET tube. Reports discomfort as 6 on a 1-10 scale. Reports some abdominal and back soreness. Abdomen soft benign. 2+ femoral and 2+ dorsalis pedis pulse on the left this morning. Stable postop day one with no evidence of her complications fromALIF Will not follow actively. Please call if we can help

## 2013-09-01 NOTE — Progress Notes (Signed)
INITIAL NUTRITION ASSESSMENT  DOCUMENTATION CODES Per approved criteria  -Not Applicable   INTERVENTION:  Encouraged intake at meals.   Discussed with pt importance of nutrition for healing and strength after surgery.   Notified Clinical Social Work of pt's potential eating disorder.   NUTRITION DIAGNOSIS: Inadequate oral intake related to long standing anorexia as evidenced by per pt report.   Goal: Pt to meet >/= 90% of their estimated nutrition needs   Monitor:  PO intake, weight trend, labs   Reason for Assessment: Pt identified as at nutrition risk on the Malnutrition Screen Tool  63 y.o. female  Admitting Dx: <principal problem not specified>  ASSESSMENT: Pt admitted with back pain who has failed conservative treatment. Pt is s/p ALIF L5-S1. Pt with screen for weight loss. Per pt she as lost 15-20 lb recently due to stress. Pt admits to having anorexia which she thinks she only has bouts of. Per pt these are brought on by stress. Pt became very tearful discussing how her husband coded in front of her this summer and then again had another episode in July in front of her granddaughter. Per pt she is afraid to leave her husband alone and is adamant that she will return home to watch over him. Pt states that she receives help for this as an outpatient. When husband entered the room in hearing us discuss if pt would consider snacks husband states "she thinks she's fat". Wife had no response to this. Pt not agreeable to any interventions such as snacks or supplements. When asked if she would be open to a snack with peanut butter pt responded "You can live on 3 teaspoons of peanut butter a day, I bet you didn't know that I would know that". Pt usually consumed one meal per day at dinner time. Pt states that she has ordered for breakfast and lunch today. Breakfast at bedside. Pt had consumed her toast and bacon.  Nutrition-focused physical exam deferred as husband and RN in room.     Height: Ht Readings from Last 1 Encounters:  08/23/13 5' 1.5" (1.562 m)    Weight: Wt Readings from Last 1 Encounters:  08/23/13 120 lb 2.4 oz (54.5 kg)    Ideal Body Weight: 50 kg  % Ideal Body Weight: 109%  Wt Readings from Last 10 Encounters:  08/23/13 120 lb 2.4 oz (54.5 kg)  08/16/13 121 lb 3.2 oz (54.976 kg)  06/02/13 115 lb (52.164 kg)  04/30/13 116 lb (52.617 kg)  04/10/09 98 lb (44.453 kg)    Usual Body Weight: 82-120 lb   % Usual Body Weight: 100%  BMI:  22.3  Estimated Nutritional Needs: Kcal: 1400-1600 Protein: 65-75 grams  Fluid: >1.5 L/day  Skin: abdominal incision  Diet Order: General Meal Completion: 25%  EDUCATION NEEDS: -No education needs identified at this time   Intake/Output Summary (Last 24 hours) at 09/01/13 0927 Last data filed at 09/01/13 0626  Gross per 24 hour  Intake   1860 ml  Output   3700 ml  Net  -1840 ml    Last BM: PTA   Labs:  No results found for this basename: NA, K, CL, CO2, BUN, CREATININE, CALCIUM, MG, PHOS, GLUCOSE,  in the last 168 hours  CBG (last 3)  No results found for this basename: GLUCAP,  in the last 72 hours  Scheduled Meds: . acetaminophen  1,000 mg Intravenous 4 times per day  . dexamethasone  4 mg Intravenous 4 times per day  Or  . dexamethasone  4 mg Oral 4 times per day  . docusate sodium  100 mg Oral BID  . enoxaparin (LOVENOX) injection  40 mg Subcutaneous Q24H  . gabapentin  300 mg Oral TID  . sodium chloride  3 mL Intravenous Q12H    Continuous Infusions: . sodium chloride    . lactated ringers 85 mL/hr at 08/31/13 1503    Past Medical History  Diagnosis Date  . Hypertension     treated with Lisonipril 5mg , BP dropped and now takes nothing  . Anxiety   . Depression   . PONV (postoperative nausea and vomiting) 1970's  . Heart murmur   . MVP (mitral valve prolapse)     mild anterior leafleat MVP with mild MR 02/26/04 echo  . Pneumonia     "I've had pneumonia probably  30 times" (08/31/2013)  . Chronic bronchitis     "got it q yr til pneumonia shot given" (08/31/2013)  . Migraines     "goes to bed dark room; last migraine maybe 5 yrs ago" (08/31/2013)  . Arthritis     "right hip" (08/31/2013)  . Chronic lower back pain     Past Surgical History  Procedure Laterality Date  . Kidney surgery  05/1976    S/P hysterectomy; cut ureter; had to repair kidney/bladder; thought they removed my kidney for years; records were destroyed; developed problems; told insides looked like mush; cleaned out the infection and repaired leaking bladder I think" (08/31/2013)  . Anterior lumbar fusion  08/31/2013  . Cholecystectomy  1970's  . Appendectomy  1970's  . Abdominal hysterectomy  05/1976  . Dilation and curettage of uterus  12/1969    Kendell Bane RD, LDN, CNSC [phone] Pager [phone] After Hours Pager

## 2013-09-01 NOTE — Progress Notes (Signed)
Subjective: Patient seen today.  Doing well.  Pain controlled.  No specific complaints.     Objective: Vital signs in last 24 hours: Temp:  [97 F (36.1 C)-98.8 F (37.1 C)] 97.9 F (36.6 C) (02/12 0626) Pulse Rate:  [70-80] 73 (02/12 0626) Resp:  [13-16] 16 (02/12 0626) BP: (95-120)/(44-58) 112/54 mmHg (02/12 0626) SpO2:  [96 %-100 %] 96 % (02/12 0626)  Intake/Output from previous day: 02/11 0701 - 02/12 0700 In: 3060 [P.O.:360; I.V.:2700] Out: 3950 [Urine:3750; Blood:200] Intake/Output this shift:    No results found for this basename: HGB,  in the last 72 hours No results found for this basename: WBC, RBC, HCT, PLT,  in the last 72 hours No results found for this basename: NA, K, CL, CO2, BUN, CREATININE, GLUCOSE, CALCIUM,  in the last 72 hours No results found for this basename: LABPT, INR,  in the last 72 hours  Exam:  Neurologically intact.  Alert and oriented.  bilat calves nontender.    Assessment/Plan: Start PT.  Anticipate d/c home Friday or Saturday depending on how she does with therarpy.  D/c pca.     OWENS,JAMES M 09/01/2013, 2:58 PM

## 2013-09-01 NOTE — Progress Notes (Signed)
*  Preliminary Results* Bilateral lower extremity venous duplex completed. Bilateral lower extremities are negative for deep vein thrombosis. There is no evidence of Baker's cyst bilaterally.  09/01/2013  Gertie FeyMichelle Neena Beecham, RVT, RDCS, RDMS

## 2013-09-01 NOTE — Care Management Note (Signed)
CARE MANAGEMENT NOTE 09/01/2013  Patient:  Elizabeth Fowler,Elizabeth Fowler   Account Number:  [id number]  Date Initiated:  09/01/2013  Documentation initiated by:  Vance PeperBRADY,Edrick Whitehorn  Subjective/Objective Assessment:   63 yr old female s/p L5-S1 ALIF     Action/Plan:   Patient has no home health or DME needs identified at this time.   Anticipated DC Date:  09/01/2013   Anticipated DC Plan:  HOME/SELF CARE      DC Planning Services  CM consult      PAC Choice  NA   Choice offered to / List presented to:     DME arranged  NA        HH arranged  NA      Status of service:  Completed, signed off Medicare Important Message given?   (If response is "NO", the following Medicare IM given date fields will be blank) Date Medicare IM given:   Date Additional Medicare IM given:    Discharge Disposition:  HOME/SELF CARE

## 2013-09-01 NOTE — Evaluation (Signed)
Physical Therapy Evaluation Patient Details Name: Elizabeth Fowler MRN: [id number] DOB: 02/07/1951 Today's Date: 09/01/2013 Time: 4098-1191 PT Time Calculation (min): 24 min  PT Assessment / Plan / Recommendation History of Present Illness  pt presents with L5-S1 ALIF.    Clinical Impression  Pt very motivated and moving great.  Pt demos good awareness of safety and only needs education on back precautions.  Anticipate great progress.      PT Assessment  Patient needs continued PT services    Follow Up Recommendations  No PT follow up;Supervision/Assistance - 24 hour    Does the patient have the potential to tolerate intense rehabilitation      Barriers to Discharge        Equipment Recommendations  None recommended by PT    Recommendations for Other Services     Frequency Min 5X/week    Precautions / Restrictions Precautions Precautions: Back Precaution Booklet Issued: Yes (comment) Restrictions Weight Bearing Restrictions: No   Pertinent Vitals/Pain 6/10 around incision and in back.  Premedicated per pt.        Mobility  Bed Mobility Overal bed mobility: Needs Assistance Bed Mobility: Rolling;Sidelying to Sit Rolling: Supervision Sidelying to sit: Supervision General bed mobility comments: cues for back precautions and log roll technique.   Transfers Overall transfer level: Needs assistance Equipment used: None Transfers: Sit to/from Stand Sit to Stand: Min guard General transfer comment: pt demos good use of UEs.   Ambulation/Gait Ambulation/Gait assistance: Min guard Ambulation Distance (Feet): 300 Feet Assistive device: Rolling walker (2 wheeled);None Gait Pattern/deviations: Step-through pattern;Decreased stride length General Gait Details: pt amb half with RW and half without RW.  Mildly unsteady and walking more cautiously without RW, but demos good awareness of safety.      Exercises     PT Diagnosis: Difficulty walking  PT Problem List: Decreased  activity tolerance;Decreased balance;Decreased mobility;Decreased knowledge of use of DME;Decreased knowledge of precautions PT Treatment Interventions: DME instruction;Gait training;Stair training;Functional mobility training;Therapeutic activities;Therapeutic exercise;Balance training;Neuromuscular re-education;Patient/family education     PT Goals(Current goals can be found in the care plan section) Acute Rehab PT Goals Patient Stated Goal: Home PT Goal Formulation: With patient Time For Goal Achievement: 09/08/13 Potential to Achieve Goals: Good  Visit Information  Last PT Received On: 09/01/13 Assistance Needed: +1 History of Present Illness: pt presents with L5-S1 ALIF.         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   Communication Communication: No difficulties (Hoarse from surgery.  )    Cognition  Cognition Arousal/Alertness: Awake/alert Behavior During Therapy: WFL for tasks assessed/performed Overall Cognitive Status: Within Functional Limits for tasks assessed    Extremity/Trunk Assessment Upper Extremity Assessment Upper Extremity Assessment: Defer to OT evaluation Lower Extremity Assessment Lower Extremity Assessment: Overall WFL for tasks assessed Cervical / Trunk Assessment Cervical / Trunk Assessment: Normal   Balance Balance Overall balance assessment: Needs assistance Standing balance support: No upper extremity supported Standing balance-Leahy Scale: Fair  End of Session PT - End of  Session Equipment Utilized During Treatment: Gait belt Activity Tolerance: Patient tolerated treatment well Patient left:  (in bathroom with pull cord)  Nurse Communication: Mobility status  GP     Sunny SchleinRitenour, Vinton Layson F, South CarolinaPT [phone] 09/01/2013, 8:31 AM

## 2013-09-01 NOTE — Evaluation (Signed)
Occupational Therapy Evaluation Patient Details Name: Elizabeth Fowler MRN: [id number] DOB: October 06, 1950 Today's Date: 09/01/2013 Time: 7829-5621 OT Time Calculation (min): 26 min  OT Assessment / Plan / Recommendation History of present illness pt presents with L5-S1 ALIF.     Clinical Impression   This 63 yo female admitted and underwent above presents to acute OT with increased back pain, decreased mobility all affecting pt being able to take care of herself. Pt will benefit from one more session of OT.    OT Assessment  Patient needs continued OT Services    Follow Up Recommendations  No OT follow up       Equipment Recommendations   (None)       Frequency  Min 2X/week    Precautions / Restrictions Precautions Precautions: Back Precaution Booklet Issued: Yes (comment) Restrictions Weight Bearing Restrictions: No   Pertinent Vitals/Pain 6/10 in abdomen to start, then towards end of session 10/10 after I asked her to attempt to cross legs to get to feet with seated at edge of recliner--pt had attempted right leg and could only get it about 1/4 way up when I asked her to stop and try the left leg. She started and then all of a sudden she had pain that she described as sharp, shooting, cramping, spasms all over her body. Helped her to lay down and called RN for pain meds.    ADL  Eating/Feeding: Independent Where Assessed - Eating/Feeding: Chair Grooming: Set up;Supervision/safety Where Assessed - Grooming: Unsupported standing Upper Body Bathing: Set up;Supervision/safety Where Assessed - Upper Body Bathing: Unsupported sitting Lower Body Bathing: Moderate assistance Where Assessed - Lower Body Bathing: Supported sit to stand Upper Body Dressing: Set up;Supervision/safety Where Assessed - Upper Body Dressing: Unsupported sitting Lower Body Dressing: +1 Total assistance Where Assessed - Lower Body Dressing: Supported sit to stand Toilet Transfer: Minimal assistance Toilet  Transfer Method: Sit to Barista:  (fold down shower seat--same height as standard toliet) Toileting - Clothing Manipulation and Hygiene: Supervision/safety Where Assessed - Engineer, mining and Hygiene: Sit to stand from 3-in-1 or toilet Equipment Used:  (none) Transfers/Ambulation Related to ADLs: min A without AD ADL Comments: Pt cannot cross her legs to get to her feet    OT Diagnosis: Generalized weakness;Acute pain  OT Problem List: Decreased range of motion;Pain;Impaired balance (sitting and/or standing) OT Treatment Interventions: Self-care/ADL training;DME and/or AE instruction;Patient/family education;Balance training   OT Goals(Current goals can be found in the care plan section) Acute Rehab OT Goals Patient Stated Goal: Home maybe tomorrow OT Goal Formulation: With patient/family Time For Goal Achievement: 09/08/13 Potential to Achieve Goals: Good  Visit Information  Last OT Received On: 09/01/13 Assistance Needed: +1 History of Present Illness: pt presents with L5-S1 ALIF.         Prior Functioning     Home Living Family/patient expects to be discharged to:: Private residence Living Arrangements: Spouse/significant other Available Help at Discharge: Family;Available 24 hours/day Type of Home: House Home Access: Stairs to enter [employer] of Steps: 3 Entrance Stairs-Rails: Right Home Layout: One level Home Equipment: Walker - 4 wheels;Bedside commode;Shower seat;Wheelchair - manual Prior Function Level of Independence: Independent Comments: pt used to be a CG for alzeheimers/dementa patients Communication Communication: No difficulties Dominant Hand: Right         Vision/Perception Vision - History Patient Visual Report: No change from baseline   Cognition  Cognition Arousal/Alertness: Awake/alert Behavior During Therapy: WFL for tasks assessed/performed Overall Cognitive Status:  Within Functional  Limits for tasks assessed    Extremity/Trunk Assessment Upper Extremity Assessment Upper Extremity Assessment: Overall WFL for tasks assessed     Mobility Bed Mobility Overal bed mobility: Needs Assistance Bed Mobility: Rolling;Sidelying to Sit;Sit to Sidelying Rolling: Supervision Sidelying to sit: Supervision Sit to sidelying: Min assist (for legs due to increased pain) General bed mobility comments: cues for back precautions and log roll technique.   Transfers Overall transfer level: Needs assistance Equipment used: None Transfers: Sit to/from Stand Sit to Stand: Min assist General transfer comment: pt demos good use of UEs.             End of Session OT - End of Session Equipment Utilized During Treatment:  (None) Activity Tolerance: Patient limited by pain (towards end of session she had this extreme pain, RN made aware and gave pain meds. Pt did report that she passed gas after being back in the bed and she now feels better pain wise) Patient left: in bed;with call bell/phone within reach;with family/visitor present Nurse Communication: Patient requests pain meds       Evette GeorgesLeonard, Keria Widrig Eva  [phone]  09/01/2013, 10:43 AM

## 2013-09-02 MED ORDER — ONDANSETRON 4 MG PO TBDP: 4.0000 mg | ORAL_TABLET | Freq: Three times a day (TID) | ORAL | Status: DC | PRN

## 2013-09-02 MED ORDER — METHOCARBAMOL 500 MG PO TABS: 500.0000 mg | ORAL_TABLET | Freq: Four times a day (QID) | ORAL | Status: DC | PRN

## 2013-09-02 MED ORDER — ENOXAPARIN SODIUM 40 MG/0.4ML ~~LOC~~ SOLN: 40.0000 mg | SUBCUTANEOUS | Status: DC

## 2013-09-02 MED ORDER — DOCUSATE SODIUM 100 MG PO CAPS: 100.0000 mg | ORAL_CAPSULE | Freq: Two times a day (BID) | ORAL | Status: DC

## 2013-09-02 NOTE — Progress Notes (Signed)
Pt discharged to home. Discharge instructions given, no questions verbalized. Vitals stable.

## 2013-09-02 NOTE — Clinical Social Work Note (Signed)
Clinical Social Worker received referral for possible ST-SNF placement.  Chart reviewed.  PT/OT recommending home with supervision.  Spoke with RN Case Manager who will follow up with patient needs as needed.    CSW signing off - please re consult if social work needs arise.  Macario GoldsJesse Lamis Behrmann, KentuckyLCSW [phone]

## 2013-09-02 NOTE — Progress Notes (Signed)
PT Cancellation Note  Patient Details Name: Elizabeth Fowler MRN: [id number] DOB: 1950-11-16   Cancelled Treatment:    Reason Eval/Treat Not Completed: Patient declined, no reason specified. Pt and family report pt about to go home.  Offered stair training and gait training, and they declined.  All of their questions answered and reviewed back precautions with pt.     Quijas,Erikson Danzy LUBECK 09/02/2013, 11:06 AM

## 2013-09-02 NOTE — Progress Notes (Signed)
Subjective: Doing great.  Had a bm.  Pain controlled. Ready to go home.     Objective: Vital signs in last 24 hours: Temp:  [97.4 F (36.3 C)-98.1 F (36.7 C)] 97.7 F (36.5 C) (02/13 0511) Pulse Rate:  [64-68] 64 (02/13 0511) Resp:  [18] 18 (02/13 0511) BP: (107-119)/(48-54) 119/48 mmHg (02/13 0511) SpO2:  [95 %-97 %] 95 % (02/13 0511) Weight:  [54.885 kg (121 lb)] 54.885 kg (121 lb) (02/13 0755)  Intake/Output from previous day:   Intake/Output this shift: Total I/O In: 120 [P.O.:120] Out: -   No results found for this basename: HGB,  in the last 72 hours No results found for this basename: WBC, RBC, HCT, PLT,  in the last 72 hours No results found for this basename: NA, K, CL, CO2, BUN, CREATININE, GLUCOSE, CALCIUM,  in the last 72 hours No results found for this basename: LABPT, INR,  in the last 72 hours Exam:  Wound looks good.  NVI   Assessment/Plan: D/c home today.  F/u with dr Shon Batonbrooks 2 weeks postop.     Kerisha Goughnour M 09/02/2013, 11:39 AM

## 2013-09-02 NOTE — Progress Notes (Signed)
Occupational Therapy Treatment Patient Details Name: TEKLA MALACHOWSKI MRN: [id number] DOB: [birth year] Today's Date: 09/02/2013 Time: 4098-1191 OT Time Calculation (min): 18 min  OT Assessment / Plan / Recommendation  History of present illness pt presents with L5-S1 ALIF.     OT comments  Pt. Moving great! Eager for d/c home asap.  All acute o.t. Goals met.  Demonstrates safe toilet and tub transfers and will have husband assist for all LB self care needs.   Follow Up Recommendations  No OT follow up                    Frequency Min 2X/week   Progress towards OT Goals Progress towards OT goals: Goals met/education completed, patient discharged from [facility] Discharge plan remains appropriate    Precautions / Restrictions Precautions Precautions: Back Restrictions Weight Bearing Restrictions: No   Pertinent Vitals/Pain 0/10 per pt.    ADL  Toilet Transfer: Performed;Modified independent Toilet Transfer Method: Sit to Loss adjuster, chartered: Regular height toilet Toileting - Clothing Manipulation and Hygiene: Performed;Modified independent Where Assessed - Toileting Clothing Manipulation and Hygiene: Standing Tub/Shower Transfer: Simulated;Modified independent Barista with back Transfers/Ambulation Related to ADLs: pt. requested no walker and did great, no LOB noted ADL Comments: can not cross legs, but states husband will assist with all LB needs, demonstrated toilet and tub/shower transfer all with mod i and has all needed dme at home including grab bars in tub    OT Goals(current goals can now be found in the care plan section)    Visit Information  Last OT Received On: 09/02/13 History of Present Illness: pt presents with L5-S1 ALIF.      Subjective Data   "i am ready to go home now"          Cognition  Cognition Arousal/Alertness: Awake/alert Behavior During Therapy: WFL for tasks assessed/performed Overall  Cognitive Status: Within Functional Limits for tasks assessed    Mobility  Bed Mobility Overal bed mobility: Modified Independent Transfers Overall transfer level: Modified independent              End of Session OT - End of Session Activity Tolerance: Patient tolerated treatment well Patient left: in chair;with call bell/phone within reach       Janice Coffin, COTA/L 09/02/2013, 10:23 AM

## 2013-09-03 NOTE — Progress Notes (Signed)
I have read and agree with this note. Cathy Arseniy Toomey, OTR/L [phone] 09/03/2013   

## 2013-09-21 NOTE — Discharge Summary (Signed)
Patient ID: Elizabeth Fowler MRN: [id number] DOB/AGE: 12-24-1950 63 y.o.  Admit date: 08/31/2013 Discharge date: 09/21/2013  Admission Diagnoses:  Active Problems:   Back pain   Discharge Diagnoses:  Active Problems:   Back pain  status post Procedure(s): ALIF L5 - S1 1 LEVEL ABDOMINAL EXPOSURE  Past Medical History  Diagnosis Date  . Hypertension     treated with Lisonipril 5mg , BP dropped and now takes nothing  . Anxiety   . Depression   . PONV (postoperative nausea and vomiting) 1970's  . Heart murmur   . MVP (mitral valve prolapse)     mild anterior leafleat MVP with mild MR 02/26/04 echo  . Pneumonia     "I've had pneumonia probably 30 times" (08/31/2013)  . Chronic bronchitis     "got it q yr til pneumonia shot given" (08/31/2013)  . Migraines     "goes to bed dark room; last migraine maybe 5 yrs ago" (08/31/2013)  . Arthritis     "right hip" (08/31/2013)  . Chronic lower back pain     Surgeries: Procedure(s): ALIF L5 - S1 1 LEVEL ABDOMINAL EXPOSURE on 08/31/2013   Consultants: Treatment Team:  Larina Earthly, MD  Discharged Condition: Improved  Hospital Course: Elizabeth Fowler is an 63 y.o. female who was admitted 08/31/2013 for operative treatment of lumbar DDD/stenosis. Patient failed conservative treatments (please see the history and physical for the specifics) and had severe unremitting pain that affects sleep, daily activities and work/hobbies. After pre-op clearance, the patient was taken to the operating room on 08/31/2013 and underwent  Procedure(s): ALIF L5 - S1 1 LEVEL ABDOMINAL EXPOSURE.    Patient was given perioperative antibiotics:  Anti-infectives   Start     Dose/Rate Route Frequency Ordered Stop   08/31/13 2000  vancomycin (VANCOCIN) IVPB 1000 mg/200 mL premix     1,000 mg 200 mL/hr over 60 Minutes Intravenous Every 12 hours 08/31/13 1452 09/01/13 0839   08/30/13 1435  vancomycin (VANCOCIN) IVPB 1000 mg/200 mL premix  Status:  Discontinued       1,000 mg 200 mL/hr over 60 Minutes Intravenous Every 12 hours 08/30/13 1435 08/31/13 1416       Patient was given sequential compression devices and early ambulation to prevent DVT.   Patient benefited maximally from hospital stay and there were no complications. At the time of discharge, the patient was urinating/moving their bowels without difficulty, tolerating a regular diet, pain is controlled with oral pain medications and they have been cleared by PT/OT.   Recent vital signs: No data found.    Recent laboratory studies: No results found for this basename: WBC, HGB, HCT, PLT, NA, K, CL, CO2, BUN, CREATININE, GLUCOSE, PT, INR, CALCIUM, 2,  in the last 72 hours   Discharge Medications:     Medication List         ALPRAZolam 1 MG tablet  Commonly known as:  XANAX  Take 1 mg by mouth 2 (two) times daily as needed for anxiety.     Calcium Carb-Cholecalciferol 600-800 MG-UNIT Chew  Chew 1 tablet by mouth daily.     diazepam 10 MG tablet  Commonly known as:  VALIUM  Take 10 mg by mouth every 6 (six) hours as needed for anxiety.     docusate sodium 100 MG capsule  Commonly known as:  COLACE  Take 1 capsule (100 mg total) by mouth 2 (two) times daily.     enoxaparin 40 MG/0.4ML injection  Commonly known as:  LOVENOX  Inject 0.4 mLs (40 mg total) into the skin daily.     gabapentin 300 MG capsule  Commonly known as:  NEURONTIN  Take 300 mg by mouth 3 (three) times daily.     HYDROmorphone 8 MG tablet  Commonly known as:  DILAUDID  Take 8 mg by mouth every 6 (six) hours as needed for severe pain.     methocarbamol 500 MG tablet  Commonly known as:  ROBAXIN  Take 1 tablet (500 mg total) by mouth every 6 (six) hours as needed for muscle spasms.     ondansetron 4 MG disintegrating tablet  Commonly known as:  ZOFRAN ODT  Take 1 tablet (4 mg total) by mouth every 8 (eight) hours as needed for nausea or vomiting.     polyethylene glycol packet  Commonly known as:   MIRALAX / GLYCOLAX  Take 17 g by mouth as needed.        Diagnostic Studies: Dg Chest 2 View  08/23/2013   CLINICAL DATA:  Lumbar disc disease. Pre operative respiratory exam.  EXAM: CHEST  2 VIEW  COMPARISON:  Chest x-ray dated 12/03/2009  FINDINGS: Heart size and pulmonary vascularity are normal. Calcified granulomas in the right lung. No infiltrates or effusions. Slight stable bilateral apical pleural thickening. Slight thoracic scoliosis, stable.  IMPRESSION: No acute abnormalities.   Electronically Signed   By: Geanie CooleyJim  Maxwell [name].   On: 08/23/2013 15:33   Dg Lumbar Spine 2-3 Views  09/01/2013   CLINICAL DATA:  Back pain.  EXAM: LUMBAR SPINE - 2-3 VIEW  COMPARISON:  09/12/2013  FINDINGS: Stable anterior interbody fusion hardware at L5-S1. No complicating features are demonstrated. The remaining disc spaces are maintained. The alignment is normal.  IMPRESSION: Normal alignment and no acute bony findings.  Stable anterior and interbody fusion hardware at L5-S1.   Electronically Signed   By: Loralie ChampagneMark  Gallerani [name].   On: 09/01/2013 08:48   Dg Lumbar Spine 2-3 Views  08/31/2013   CLINICAL DATA:  L5-S1 ALIF.  EXAM: LUMBAR SPINE - 2-3 VIEW; DG C-ARM 61-120 MIN  COMPARISON:  Lumbar spine radiographs 08/23/2013.  FINDINGS: Two spot fluoroscopic images of the lower lumbar spine demonstrate discectomy and anterior interbody fusion at L5-S1. The hardware appears well positioned. No complications are identified.  IMPRESSION: No demonstrated complication following L5-S1 ALIF.   Electronically Signed   By: Roxy HorsemanBill  Veazey [name].   On: 08/31/2013 14:25   Dg Lumbar Spine 2-3 Views  08/23/2013   CLINICAL DATA:  Preop for lumbar spine fusion.  EXAM: LUMBAR SPINE - 2-3 VIEW  COMPARISON:  DG LUMBAR SPINE 2-3 VIEWS dated 05/13/2012; MR-L-SPINE dated 02/04/2008  FINDINGS: Diminutive twelfth ribs. Presuming this nomenclature (and that of the 02/04/2008 MRI), 5 lumbar type vertebral bodies. Degenerative disc disease at the  lumbosacral junction. Maintenance of vertebral body height and alignment.  IMPRESSION: Degenerative disc disease at the lumbosacral junction.   Electronically Signed   By: Jeronimo GreavesKyle  Talbot [name].   On: 08/23/2013 15:25   Dg C-arm 61-120 Min  08/31/2013   CLINICAL DATA:  L5-S1 ALIF.  EXAM: LUMBAR SPINE - 2-3 VIEW; DG C-ARM 61-120 MIN  COMPARISON:  Lumbar spine radiographs 08/23/2013.  FINDINGS: Two spot fluoroscopic images of the lower lumbar spine demonstrate discectomy and anterior interbody fusion at L5-S1. The hardware appears well positioned. No complications are identified.  IMPRESSION: No demonstrated complication following L5-S1 ALIF.   Electronically Signed   By: Sandi MariscalBill  Veazey [name].  On: 08/31/2013 14:25   Dg Or Local Abdomen  08/31/2013   CLINICAL DATA:  Final instrument counts status post L5-S1 ALIF  EXAM: OR LOCAL ABDOMEN  COMPARISON:  Preoperative radiographs 08/23/2013  FINDINGS: Single frontal view of the abdomen demonstrates interval surgical changes of anterior lumbar interbody fusion at L5-S1. Unremarkable visualized bowel gas pattern. No acute fracture or evidence of hardware complication. The marker of an interbody graft is present within the disc space. No unexpected radiopaque foreign body identified.  IMPRESSION: Surgical changes of L5-S1 anterior lumbar interbody fusion. No evidence of unexpected retained foreign body.  These results were called by telephone at the time of interpretation on 08/31/2013 at 11:26 AM to Dr. Venita Lick , who verbally acknowledged these results.   Electronically Signed   By: Malachy Moan [name].   On: 08/31/2013 11:27        Discharge Orders   Future Orders Complete By Expires   Call MD / Call 911  As directed    Comments:     If you experience chest pain or shortness of breath, CALL 911 and be transported to the hospital emergency room.  If you develope a fever above 101 F, pus (white drainage) or increased drainage or redness at the wound, or calf  pain, call your surgeon's office.   Constipation Prevention  As directed    Comments:     Drink plenty of fluids.  Prune juice may be helpful.  You may use a stool softener, such as Colace (over the counter) 100 mg twice a day.  Use MiraLax (over the counter) for constipation as needed.   Diet - low sodium heart healthy  As directed    Discharge instructions  As directed    Comments:     Ok to shower 5 days postop.  Do not apply any creams or ointments to incision.  Do not remove steri-strips.  Can use 4x4 gauze and tape for dressing changes.  No aggressive activity.  No bending, squatting or prolonged sitting.  Mostly be in reclined position or lying down.   Ok to shower 5 days postop.  Do not apply any creams or ointments to incision.  Do not remove steri-strips.  Can use 4x4 gauze and tape for dressing changes.  No aggressive activity.  No bending, squatting or prolonged sitting.  Mostly be in reclined position or lying down.  Must wear brace.   Driving restrictions  As directed    Comments:     No driving until further notice.   Increase activity slowly as tolerated  As directed    Lifting restrictions  As directed    Comments:     No lifting until further notice.No lifting until further notice.      Follow-up Information   Follow up with Alvy Beal, MD. (need return office visit 2 weeks postop)    Specialty:  Orthopedic Surgery   Contact information:   [address] [place] [address] [phone]       Discharge Plan:  discharge to home      Signed: Naida Sleight for Dr. Venita Lick [facility] [phone] 09/21/2013, 11:07 AM

## 2013-09-21 NOTE — Discharge Summary (Signed)
Agree with above 

## 2014-02-21 ENCOUNTER — Emergency Department (HOSPITAL_COMMUNITY)
Admission: EM | Admit: 2014-02-21 | Discharge: 2014-02-21 | Disposition: A | Discharge status: Home / Self Care | Payer: Medicaid Other | Attending: Emergency Medicine | Admitting: Emergency Medicine

## 2014-02-21 ENCOUNTER — Encounter (HOSPITAL_COMMUNITY): Payer: Self-pay | Admitting: Emergency Medicine

## 2014-02-21 ENCOUNTER — Emergency Department (HOSPITAL_COMMUNITY): Payer: Medicaid Other

## 2014-02-21 DIAGNOSIS — S3981XA Other specified injuries of abdomen, initial encounter: Secondary | ICD-10-CM | POA: Insufficient documentation

## 2014-02-21 DIAGNOSIS — G43909 Migraine, unspecified, not intractable, without status migrainosus: Secondary | ICD-10-CM | POA: Diagnosis not present

## 2014-02-21 DIAGNOSIS — I10 Essential (primary) hypertension: Secondary | ICD-10-CM | POA: Insufficient documentation

## 2014-02-21 DIAGNOSIS — F411 Generalized anxiety disorder: Secondary | ICD-10-CM | POA: Diagnosis not present

## 2014-02-21 DIAGNOSIS — M545 Low back pain, unspecified: Secondary | ICD-10-CM

## 2014-02-21 DIAGNOSIS — R109 Unspecified abdominal pain: Secondary | ICD-10-CM

## 2014-02-21 DIAGNOSIS — Z79899 Other long term (current) drug therapy: Secondary | ICD-10-CM | POA: Insufficient documentation

## 2014-02-21 DIAGNOSIS — S298XXA Other specified injuries of thorax, initial encounter: Secondary | ICD-10-CM | POA: Diagnosis not present

## 2014-02-21 DIAGNOSIS — F3289 Other specified depressive episodes: Secondary | ICD-10-CM | POA: Insufficient documentation

## 2014-02-21 DIAGNOSIS — M5430 Sciatica, unspecified side: Secondary | ICD-10-CM | POA: Insufficient documentation

## 2014-02-21 DIAGNOSIS — Z8709 Personal history of other diseases of the respiratory system: Secondary | ICD-10-CM | POA: Diagnosis not present

## 2014-02-21 DIAGNOSIS — Y929 Unspecified place or not applicable: Secondary | ICD-10-CM | POA: Diagnosis not present

## 2014-02-21 DIAGNOSIS — M5441 Lumbago with sciatica, right side: Secondary | ICD-10-CM

## 2014-02-21 DIAGNOSIS — Z8739 Personal history of other diseases of the musculoskeletal system and connective tissue: Secondary | ICD-10-CM | POA: Insufficient documentation

## 2014-02-21 DIAGNOSIS — R011 Cardiac murmur, unspecified: Secondary | ICD-10-CM | POA: Insufficient documentation

## 2014-02-21 DIAGNOSIS — F329 Major depressive disorder, single episode, unspecified: Secondary | ICD-10-CM | POA: Insufficient documentation

## 2014-02-21 DIAGNOSIS — F17200 Nicotine dependence, unspecified, uncomplicated: Secondary | ICD-10-CM | POA: Diagnosis not present

## 2014-02-21 DIAGNOSIS — Z880 Allergy status to penicillin: Secondary | ICD-10-CM | POA: Diagnosis not present

## 2014-02-21 DIAGNOSIS — R079 Chest pain, unspecified: Secondary | ICD-10-CM

## 2014-02-21 DIAGNOSIS — R55 Syncope and collapse: Secondary | ICD-10-CM

## 2014-02-21 DIAGNOSIS — IMO0002 Reserved for concepts with insufficient information to code with codable children: Secondary | ICD-10-CM | POA: Insufficient documentation

## 2014-02-21 DIAGNOSIS — Z8701 Personal history of pneumonia (recurrent): Secondary | ICD-10-CM | POA: Diagnosis not present

## 2014-02-21 DIAGNOSIS — W07XXXA Fall from chair, initial encounter: Secondary | ICD-10-CM | POA: Insufficient documentation

## 2014-02-21 DIAGNOSIS — G8929 Other chronic pain: Secondary | ICD-10-CM | POA: Insufficient documentation

## 2014-02-21 DIAGNOSIS — Y939 Activity, unspecified: Secondary | ICD-10-CM | POA: Insufficient documentation

## 2014-02-21 LAB — CBC WITH DIFFERENTIAL/PLATELET
Basophils Absolute: 0.1 10*3/uL (ref 0.0–0.1)
Basophils Relative: 1 % (ref 0–1)
Eosinophils Absolute: 0.3 10*3/uL (ref 0.0–0.7)
Eosinophils Relative: 4 % (ref 0–5)
HCT: 45.8 % (ref 36.0–46.0)
Hemoglobin: 15.2 g/dL — ABNORMAL HIGH (ref 12.0–15.0)
Lymphocytes Relative: 31 % (ref 12–46)
Lymphs Abs: 2.4 10*3/uL (ref 0.7–4.0)
MCH: 29.7 pg (ref 26.0–34.0)
MCHC: 33.2 g/dL (ref 30.0–36.0)
MCV: 89.5 fL (ref 78.0–100.0)
Monocytes Absolute: 0.6 10*3/uL (ref 0.1–1.0)
Monocytes Relative: 7 % (ref 3–12)
Neutro Abs: 4.5 10*3/uL (ref 1.7–7.7)
Neutrophils Relative %: 57 % (ref 43–77)
Platelets: 168 10*3/uL (ref 150–400)
RBC: 5.12 MIL/uL — ABNORMAL HIGH (ref 3.87–5.11)
RDW: 13.9 % (ref 11.5–15.5)
WBC: 7.8 10*3/uL (ref 4.0–10.5)

## 2014-02-21 LAB — COMPREHENSIVE METABOLIC PANEL
ALT: 8 U/L (ref 0–35)
AST: 15 U/L (ref 0–37)
Albumin: 4 g/dL (ref 3.5–5.2)
Alkaline Phosphatase: 83 U/L (ref 39–117)
Anion gap: 15 (ref 5–15)
BUN: 10 mg/dL (ref 6–23)
CO2: 23 mEq/L (ref 19–32)
Calcium: 9.2 mg/dL (ref 8.4–10.5)
Chloride: 103 mEq/L (ref 96–112)
Creatinine, Ser: 0.57 mg/dL (ref 0.50–1.10)
GFR calc Af Amer: >90 mL/min (ref >90)
GFR calc non Af Amer: >90 mL/min (ref >90)
Glucose, Bld: 87 mg/dL (ref 70–99)
Potassium: 3.7 mEq/L (ref 3.7–5.3)
Sodium: 141 mEq/L (ref 137–147)
Total Bilirubin: 0.5 mg/dL (ref 0.3–1.2)
Total Protein: 7 g/dL (ref 6.0–8.3)

## 2014-02-21 LAB — URINE MICROSCOPIC-ADD ON

## 2014-02-21 LAB — URINALYSIS, ROUTINE W REFLEX MICROSCOPIC
Bilirubin Urine: NEGATIVE
Glucose, UA: NEGATIVE mg/dL
Hgb urine dipstick: NEGATIVE
Ketones, ur: NEGATIVE mg/dL
Leukocytes, UA: NEGATIVE
Nitrite: POSITIVE — AB
Protein, ur: NEGATIVE mg/dL
Specific Gravity, Urine: 1.009 (ref 1.005–1.030)
Urobilinogen, UA: 1 mg/dL (ref 0.0–1.0)
pH: 6.5 (ref 5.0–8.0)

## 2014-02-21 LAB — I-STAT TROPONIN, ED: Troponin i, poc: 0 ng/mL (ref 0.00–0.08)

## 2014-02-21 LAB — LIPASE, BLOOD: Lipase: 13 U/L (ref 11–59)

## 2014-02-21 MED ORDER — HYDROMORPHONE HCL PF 1 MG/ML IJ SOLN
1.0000 mg | Freq: Once | INTRAMUSCULAR | Status: AC
  Administered 2014-02-21: 1 mg via INTRAVENOUS
  Filled 2014-02-21: qty 1

## 2014-02-21 NOTE — ED Notes (Signed)
Pt reports that she fell on the 25th of July and then on August 1st she was at [facility] and began having severe pain then. States that she was seen at PCP office today and told to come here for rule out cauda equina syndrome. Reports that yesterday she was incontinent of urine. Reports that she had back surgery in February by Dr. Shon BatonBrooks. Reports pain to the right leg, and decreased sensation in the right leg.

## 2014-02-21 NOTE — ED Notes (Signed)
Dr.bednar at bedside

## 2014-02-21 NOTE — Discharge Instructions (Signed)
SEEK IMMEDIATE MEDICAL ATTENTION IF: New numbness, tingling, weakness, or problem with the use of your arms or legs.  Severe back pain not relieved with medications.  Change in bowel or bladder control.  Increasing pain in any areas of the body (such as chest or abdominal pain).  Shortness of breath, dizziness or fainting.  Nausea (feeling sick to your stomach), vomiting, fever, or sweats.  Abdominal (belly) pain can be caused by many things. Your caregiver performed an examination and possibly ordered blood/urine tests and imaging (CT scan, x-rays, ultrasound). Many cases can be observed and treated at home after initial evaluation in the emergency department. Even though you are being discharged home, abdominal pain can be unpredictable. Therefore, you need a repeated exam if your pain does not resolve, returns, or worsens. Most patients with abdominal pain don't have to be admitted to the hospital or have surgery, but serious problems like appendicitis and gallbladder attacks can start out as nonspecific pain. Many abdominal conditions cannot be diagnosed in one visit, so follow-up evaluations are very important. SEEK IMMEDIATE MEDICAL ATTENTION IF: The pain does not go away or becomes severe.  A temperature above 101 develops.  Repeated vomiting occurs (multiple episodes).  The pain becomes localized to portions of the abdomen. The right side could possibly be appendicitis. In an adult, the left lower portion of the abdomen could be colitis or diverticulitis.  Blood is being passed in stools or vomit (bright red or black tarry stools).  Return also if you develop chest pain, difficulty breathing, dizziness or fainting, or become confused, poorly responsive, or inconsolable (young children).  Your caregiver has diagnosed you as having chest pain that is not specific for one problem, but does not require admission.  You are at low risk for an acute heart condition or other serious illness. Chest  pain comes from many different causes.  SEEK IMMEDIATE MEDICAL ATTENTION IF: You have severe chest pain, especially if the pain is crushing or pressure-like and spreads to the arms, back, neck, or jaw, or if you have sweating, nausea (feeling sick to your stomach), or shortness of breath. THIS IS AN EMERGENCY. Don't wait to see if the pain will go away. Get medical help at once. Call 911 or 0 (operator). DO NOT drive yourself to the hospital.  Your chest pain gets worse and does not go away with rest.  You have an attack of chest pain lasting longer than usual, despite rest and treatment with the medications your caregiver has prescribed.  You wake from sleep with chest pain or shortness of breath.  You feel dizzy or faint.  You have chest pain not typical of your usual pain for which you originally saw your caregiver.  Your caregiver has seen you today because you are having problems with feelings of weakness, dizziness, and/or fatigue. Weakness has many different causes, some of which are common and others are very rare. Your caregiver has considered some of the most common causes of weakness and feels it is safe for you to go home and be observed. Not every illness or injury can be identified during an emergency department visit, thus follow-up with your primary healthcare provider is important. Medical conditions can also worsen, so it is also important to return immediately as directed below, or if you have other serious concerns develop. RETURN IMMEDIATELY IF you develop new shortness of breath, chest pain, fever, have difficulty moving parts of your body (new weakness, numbness, or incoordination), sudden change in speech,  vision, swallowing, or understanding, faint or develop new dizziness, severe headache, become poorly responsive or have an altered mental status compared to baseline for you, new rash, abdominal pain, or bloody stools,  Return sooner also if you develop new problems for which you  have not talked to your caregiver but you feel may be emergency medical conditions, or are unable to be cared for safely at home.

## 2014-02-21 NOTE — ED Notes (Signed)
Dr.Bednar at bedside ;water given per MD

## 2014-02-21 NOTE — ED Provider Notes (Signed)
CSN: [id number]     Arrival date & time 02/21/14  1724 History   First MD Initiated Contact with Patient 02/21/14 1946     Chief Complaint  Patient presents with  . Fall     (Consider location/radiation/quality/duration/timing/severity/associated sxs/prior Treatment) HPI 63 year old female at baseline has chronic severe back pain radiating down her right leg with weakness and numbness to the right leg despite prior surgery, several days ago she leaned over and accidentally fell out of a chair with possible mild closed head injury, when she woke up she realized she must of either fainted or hit her head and passed out and then had a few minutes of chest pain that resolved spontaneously, a couple days later she had a few minutes of abdominal pain, since that fall off a chair on her deck she has had worse than baseline back pain with worse than baseline right leg pain with worse than baseline numbness and weakness to the right leg and now reports 2 days of intermittent bladder incontinence, she has not had recurrent chest pain, she has not had recurrent syncope, she has not had recurrent trauma, she did not have any recurrent abdominal pain until she was waiting in the emergency department and now describes generalized vague abdominal discomfort mild for the last 2 hours without dysuria nausea vomiting diarrhea bloody stools or other concerns. At baseline she has severe neck pain radiating to her right shoulder and had MRI within the last week for that. She is no headache or confusion. She was sent to the emergency department after seeing her primary care doctor today who recommended emergency department visit to rule out cauda equina syndrome. Past Medical History  Diagnosis Date  . Hypertension     treated with Lisonipril 5mg , BP dropped and now takes nothing  . Anxiety   . Depression   . PONV (postoperative nausea and vomiting) 1970's  . Heart murmur   . MVP (mitral valve prolapse)     mild  anterior leafleat MVP with mild MR 02/26/04 echo  . Pneumonia     "I've had pneumonia probably 30 times" (08/31/2013)  . Chronic bronchitis     "got it q yr til pneumonia shot given" (08/31/2013)  . Migraines     "goes to bed dark room; last migraine maybe 5 yrs ago" (08/31/2013)  . Arthritis     "right hip" (08/31/2013)  . Chronic lower back pain    Past Surgical History  Procedure Laterality Date  . Kidney surgery  05/1976    S/P hysterectomy; cut ureter; had to repair kidney/bladder; thought they removed my kidney for years; records were destroyed; developed problems; told insides looked like mush; cleaned out the infection and repaired leaking bladder I think" (08/31/2013)  . Anterior lumbar fusion  08/31/2013  . Cholecystectomy  1970's  . Appendectomy  1970's  . Abdominal hysterectomy  05/1976  . Dilation and curettage of uterus  12/1969  . Anterior lumbar fusion N/A 08/31/2013    Procedure: ALIF L5 - S1 1 LEVEL;  Surgeon: Venita Lick, MD;  Location: [facility] OR;  Service: Orthopedics;  Laterality: N/A;  . Abdominal exposure N/A 08/31/2013    Procedure: ABDOMINAL EXPOSURE;  Surgeon: Larina Earthly, MD;  Location: [facility] OR;  Service: Vascular;  Laterality: N/A;   Family History  Problem Relation Age of Onset  . Heart disease Mother   . Hypertension Mother    History  Substance Use Topics  . Smoking status: Current Every Day Smoker --  0.50 packs/day for 50 years    Types: Cigarettes    Last Attempt to Quit: 07/31/2013  . Smokeless tobacco: Never Used  . Alcohol Use: No     Comment: 08/31/2013 "drank a little years ago; last drink was probably in the 1990's"   OB History   Grav Para Term Preterm Abortions TAB SAB Ect Mult Living                 Review of Systems 10 Systems reviewed and are negative for acute change except as noted in the HPI.   Allergies  Penicillins  Home Medications   Prior to Admission medications   Medication Sig Start Date End Date Taking? Authorizing  Provider  ALPRAZolam Prudy Feeler(XANAX) 1 MG tablet Take 1 mg by mouth 3 (three) times daily as needed for anxiety.    Yes Historical Provider, MD  diazepam (VALIUM) 10 MG tablet Take 10 mg by mouth every 6 (six) hours as needed for anxiety.   Yes Historical Provider, MD  docusate sodium (COLACE) 100 MG capsule Take 100 mg by mouth daily as needed for mild constipation.   Yes Historical Provider, MD  gabapentin (NEURONTIN) 300 MG capsule Take 300 mg by mouth 3 (three) times daily.   Yes Historical Provider, MD  HYDROmorphone (DILAUDID) 8 MG tablet Take 8 mg by mouth every 6 (six) hours as needed for severe pain.   Yes Historical Provider, MD  methocarbamol (ROBAXIN) 500 MG tablet Take 1 tablet (500 mg total) by mouth every 6 (six) hours as needed for muscle spasms. 09/02/13  Yes Zonia KiefJames Owens, PA-C  polyethylene glycol (MIRALAX / GLYCOLAX) packet Take 17 g by mouth daily as needed (constipation).    Yes Historical Provider, MD   BP 111/69  Pulse 72  Temp(Src) 98.1 F (36.7 C) (Oral)  Resp 16  SpO2 93% Physical Exam  Nursing note and vitals reviewed. Constitutional:  Awake, alert, nontoxic appearance with baseline speech.  HENT:  Head: Atraumatic.  Eyes: Pupils are equal, round, and reactive to light. Right eye exhibits no discharge. Left eye exhibits no discharge.  Neck: Neck supple.  Cardiovascular: Normal rate and regular rhythm.   No murmur heard. Pulmonary/Chest: Effort normal and breath sounds normal. No respiratory distress. She has no wheezes. She has no rales. She exhibits no tenderness.  Abdominal: Soft. Bowel sounds are normal. She exhibits no mass. There is no tenderness. There is no rebound.  Musculoskeletal: She exhibits tenderness.       Thoracic back: She exhibits no tenderness.       Lumbar back: She exhibits no tenderness.  Diffuse lumbar tenderness. Bilateral lower extremities non tender without new rashes or color change, baseline ROM with intact DP pulses, CR<2 secs all digits  bilaterally, sensation worse than baseline numbness entire right leg with normal light touch left leg, DTR's symmetric and intact bilaterally KJ / AJ, motor right 5 / 5 hip flexion, quadriceps, hamstrings, EHL, foot dorsiflexion, foot plantarflexion,left 5 / 5 hip flexion, quadriceps, hamstrings, EHL, foot dorsiflexion, foot plantarflexion gait somewhat antalgic but without apparent new ataxia.  Neurological: She is alert.  Mental status baseline for patient.  Upper extremity motor strength and sensation intact and symmetric bilaterally.  Skin: No rash noted.  Psychiatric: She has a normal mood and affect.    ED Course  Procedures (including critical care time) Abdominal pain resolved in the emergency dept. Labs Review Labs Reviewed  CBC WITH DIFFERENTIAL - Abnormal; Notable for the following:    RBC  5.12 (*)    Hemoglobin 15.2 (*)    All other components within normal limits  URINALYSIS, ROUTINE W REFLEX MICROSCOPIC - Abnormal; Notable for the following:    APPearance CLOUDY (*)    Nitrite POSITIVE (*)    All other components within normal limits  URINE MICROSCOPIC-ADD ON - Abnormal; Notable for the following:    Bacteria, UA FEW (*)    All other components within normal limits  COMPREHENSIVE METABOLIC PANEL  LIPASE, BLOOD  I-STAT TROPOININ, ED    Imaging Review No results found. Mr Thoracic Spine Wo Contrast  02/21/2014   CLINICAL DATA:  incontinence bladder 2 days; right leg weak numb, worse than baseline  EXAM: MRI THORACIC AND LUMBAR SPINE WITHOUT CONTRAST  TECHNIQUE: Multiplanar and multiecho pulse sequences of the thoracic and lumbar spine were obtained without intravenous contrast.  COMPARISON:  None.  FINDINGS: MR THORACIC SPINE FINDINGS  Minimal dextroscoliosis of the thoracic spine is present. Otherwise, vertebral bodies are normally aligned with preservation of the normal thoracic kyphosis. Vertebral body heights are well maintained. Signal intensity within the vertebral  body bone marrow is normal. No focal osseous lesion.  The thoracic spinal cord demonstrates a normal appearance with normal signal intensity. No evidence of cord compression. No epidural fluid collection.  Minimal degenerative disc bulge present at T8-9 without significant canal or foraminal stenosis. There is mild right-sided facet arthrosis at T10-11 which minimally impinges upon the right posterior thecal sac (series 7, image 31). Mild bilateral facet arthrosis also present at T11-12. No other significant degenerative changes present within the thoracic spine.  Paraspinous soft tissues are within normal limits. Visualized lungs are clear.  MR LUMBAR SPINE FINDINGS  For the purposes of this dictation, the lowest well-formed intervertebral disc spaces presumed to be the L5-S1 level, and there presumed to be 5 lumbar type vertebral bodies.  Patient is status post ALIF at the L5-S1 level. Overall position and appearance of the hardware is stable without complication.  Vertebral bodies are normally aligned with preservation of the normal lumbar lordosis. Vertebral body heights are well maintained. Signal intensity within the vertebral body bone marrow is normal. No focal osseous lesions.  The conus medullaris terminates normally at the L1 level. No cord signal abnormality identified. No epidural collection. No evidence of cord compression.  At L1-2 through L3-4, there is no disc bulge or disc protrusion. No significant facet arthrosis. No canal or neural foraminal stenosis.  At L4-5, there is mild bilateral facet hypertrophy with minimal disc bulge. No significant canal or foraminal stenosis.  At L5-S1, no disc protrusion or significant disc bulge. No significant facet arthrosis. No canal or neural foraminal stenosis.  Perineural nerve root sleeve cyst noted posterior to the S1 and S2 segments.  Paraspinous soft tissues within normal limits.  IMPRESSION: MR THORACIC SPINE IMPRESSION  1. No evidence of cord compression  or other acute abnormality within the thoracic spine. 2. Minimal degenerative changes at T8-9 and T10-11, and T11-12 as above.  MR LUMBAR SPINE IMPRESSION  1. No evidence of cord compression or other acute abnormality within the lumbar spine. 2. Status post ALIF at L5-S1 without complication. 3. Minimal degenerative changes at L4-5 as above. No significant canal or foraminal narrowing within the lumbar spine.   Electronically Signed   By: Rise Mu [name].   On: 02/21/2014 22:10   Mr Lumbar Spine Wo Contrast  02/21/2014   CLINICAL DATA:  incontinence bladder 2 days; right leg weak numb, worse than baseline  EXAM: MRI THORACIC AND LUMBAR SPINE WITHOUT CONTRAST  TECHNIQUE: Multiplanar and multiecho pulse sequences of the thoracic and lumbar spine were obtained without intravenous contrast.  COMPARISON:  None.  FINDINGS: MR THORACIC SPINE FINDINGS  Minimal dextroscoliosis of the thoracic spine is present. Otherwise, vertebral bodies are normally aligned with preservation of the normal thoracic kyphosis. Vertebral body heights are well maintained. Signal intensity within the vertebral body bone marrow is normal. No focal osseous lesion.  The thoracic spinal cord demonstrates a normal appearance with normal signal intensity. No evidence of cord compression. No epidural fluid collection.  Minimal degenerative disc bulge present at T8-9 without significant canal or foraminal stenosis. There is mild right-sided facet arthrosis at T10-11 which minimally impinges upon the right posterior thecal sac (series 7, image 31). Mild bilateral facet arthrosis also present at T11-12. No other significant degenerative changes present within the thoracic spine.  Paraspinous soft tissues are within normal limits. Visualized lungs are clear.  MR LUMBAR SPINE FINDINGS  For the purposes of this dictation, the lowest well-formed intervertebral disc spaces presumed to be the L5-S1 level, and there presumed to be 5 lumbar type  vertebral bodies.  Patient is status post ALIF at the L5-S1 level. Overall position and appearance of the hardware is stable without complication.  Vertebral bodies are normally aligned with preservation of the normal lumbar lordosis. Vertebral body heights are well maintained. Signal intensity within the vertebral body bone marrow is normal. No focal osseous lesions.  The conus medullaris terminates normally at the L1 level. No cord signal abnormality identified. No epidural collection. No evidence of cord compression.  At L1-2 through L3-4, there is no disc bulge or disc protrusion. No significant facet arthrosis. No canal or neural foraminal stenosis.  At L4-5, there is mild bilateral facet hypertrophy with minimal disc bulge. No significant canal or foraminal stenosis.  At L5-S1, no disc protrusion or significant disc bulge. No significant facet arthrosis. No canal or neural foraminal stenosis.  Perineural nerve root sleeve cyst noted posterior to the S1 and S2 segments.  Paraspinous soft tissues within normal limits.  IMPRESSION: MR THORACIC SPINE IMPRESSION  1. No evidence of cord compression or other acute abnormality within the thoracic spine. 2. Minimal degenerative changes at T8-9 and T10-11, and T11-12 as above.  MR LUMBAR SPINE IMPRESSION  1. No evidence of cord compression or other acute abnormality within the lumbar spine. 2. Status post ALIF at L5-S1 without complication. 3. Minimal degenerative changes at L4-5 as above. No significant canal or foraminal narrowing within the lumbar spine.   Electronically Signed   By: Rise Mu [name].   On: 02/21/2014 22:10   EKG Interpretation   Date/Time:  Tuesday February 21 2014 20:41:18 EDT Ventricular Rate:  67 PR Interval:  177 QRS Duration: 82 QT Interval:  386 QTC Calculation: 407 R Axis:   82 Text Interpretation:  Sinus rhythm Left atrial enlargement Borderline  right axis deviation No significant change since last tracing Confirmed by   [facility]  MD, Jonny Ruiz ([id number]) on 02/21/2014 9:10:13 PM      MDM   Final diagnoses:  Low back pain with right-sided sciatica, unspecified back pain laterality  Chronic low back pain  Abdominal pain, unspecified abdominal location  Chest pain, unspecified chest pain type  Syncope, unspecified syncope type    Patient / Family / Caregiver informed of clinical course, understand medical decision-making process, and agree with plan.  I doubt any other EMC precluding discharge at this time including,  but not necessarily limited to the following:cauda equina.    Hurman Horn, MD 02/25/14 1900

## 2014-02-21 NOTE — ED Notes (Signed)
Pt c/o sharp bladder pain x 2 minutes on August 1, associated with sharp shooting pain to pelvis and down R leg.  Pt is seen by Dr.[name] for cervical surgery.  Pt reports yesterday while talking to a friend, lost control of bladder; denies incontinence of bowel. Pt was sent to ED by PCP for further eval of possible cauda equina syndrome

## 2014-07-03 NOTE — Pre-Procedure Instructions (Signed)
Pilar JarvisWanda H Yeats  07/03/2014   Your procedure is scheduled on:  Thursday, December 17th  [facility]30 AM.  Call this number if you have problems the morning of surgery: [phone]   Remember:   Do not eat food or drink liquids after midnight.   Take these medicines the morning of surgery with A SIP OF WATER: wellbutrin, neurontin, xanax or valium if needed, dilaudid if needed, albuterol if needed   Do not wear jewelry, make-up or nail polish.  Do not wear lotions, powders, or perfume, deodorant.  Do not shave 48 hours prior to surgery. Men may shave face and neck.  Do not bring valuables to the hospital.  [facility] is not responsible for any belongings or valuables.               Contacts, dentures or bridgework may not be worn into surgery.  Leave suitcase in the car. After surgery it may be brought to your room.  For patients admitted to the hospital, discharge time is determined by your  treatment team.              Patients discharged the day of surgery will not be allowed to drive home.  Please read over the following fact sheets that you were given: Pain Booklet, Coughing and Deep Breathing, MRSA Information and Surgical Site Infection Prevention  [place] - Preparing for Surgery  Before surgery, you can play an important role.  Because skin is not sterile, your skin needs to be as free of germs as possible.  You can reduce the number of germs on you skin by washing with CHG (chlorahexidine gluconate) soap before surgery.  CHG is an antiseptic cleaner which kills germs and bonds with the skin to continue killing germs even after washing.  Please DO NOT use if you have an allergy to CHG or antibacterial soaps.  If your skin becomes reddened/irritated stop using the CHG and inform your nurse when you arrive at Short Stay.  Do not shave (including legs and underarms) for at least 48 hours prior to the first CHG shower.  You may shave your  face.  Please follow these instructions carefully:   1.  Shower with CHG Soap the night before surgery and the morning of Surgery.  2.  If you choose to wash your hair, wash your hair first as usual with your normal shampoo.  3.  After you shampoo, rinse your hair and body thoroughly to remove the shampoo.  4.  Use CHG as you would any other liquid soap.  You can apply CHG directly to the skin and wash gently with scrungie or a clean washcloth.  5.  Apply the CHG Soap to your body ONLY FROM THE NECK DOWN.  Do not use on open wounds or open sores.  Avoid contact with your eyes, ears, mouth and genitals (private parts).  Wash genitals (private parts) with your normal soap.  6.  Wash thoroughly, paying special attention to the area where your surgery will be performed.  7.  Thoroughly rinse your body with warm water from the neck down.  8.  DO NOT shower/wash with your normal soap after using and rinsing off the CHG Soap.  9.  Pat yourself dry with a clean towel.            10.  Wear clean pajamas.            11.  Place clean  sheets on your bed the night of your first shower and do not sleep with pets.  Day of Surgery  Do not apply any lotions/deoderants the morning of surgery.  Please wear clean clothes to the hospital/surgery center.

## 2014-07-04 ENCOUNTER — Encounter (HOSPITAL_COMMUNITY)
Admission: RE | Admit: 2014-07-04 | Discharge: 2014-07-04 | Disposition: A | Discharge status: Home / Self Care | Payer: Medicaid Other | Source: Ambulatory Visit | Attending: Orthopedic Surgery | Admitting: Orthopedic Surgery

## 2014-07-04 ENCOUNTER — Encounter (HOSPITAL_COMMUNITY): Payer: Self-pay

## 2014-07-04 DIAGNOSIS — M545 Low back pain: Secondary | ICD-10-CM | POA: Diagnosis not present

## 2014-07-04 DIAGNOSIS — Z880 Allergy status to penicillin: Secondary | ICD-10-CM | POA: Diagnosis not present

## 2014-07-04 DIAGNOSIS — G8929 Other chronic pain: Secondary | ICD-10-CM | POA: Diagnosis not present

## 2014-07-04 DIAGNOSIS — I341 Nonrheumatic mitral (valve) prolapse: Secondary | ICD-10-CM | POA: Diagnosis not present

## 2014-07-04 DIAGNOSIS — M47892 Other spondylosis, cervical region: Secondary | ICD-10-CM | POA: Diagnosis not present

## 2014-07-04 DIAGNOSIS — F17210 Nicotine dependence, cigarettes, uncomplicated: Secondary | ICD-10-CM | POA: Diagnosis not present

## 2014-07-04 DIAGNOSIS — F419 Anxiety disorder, unspecified: Secondary | ICD-10-CM | POA: Diagnosis not present

## 2014-07-04 DIAGNOSIS — J449 Chronic obstructive pulmonary disease, unspecified: Secondary | ICD-10-CM | POA: Diagnosis not present

## 2014-07-04 DIAGNOSIS — M13851 Other specified arthritis, right hip: Secondary | ICD-10-CM | POA: Diagnosis not present

## 2014-07-04 DIAGNOSIS — R011 Cardiac murmur, unspecified: Secondary | ICD-10-CM | POA: Diagnosis not present

## 2014-07-04 DIAGNOSIS — F329 Major depressive disorder, single episode, unspecified: Secondary | ICD-10-CM | POA: Diagnosis not present

## 2014-07-04 HISTORY — DX: Zoster without complications: B02.9

## 2014-07-04 LAB — CBC
HCT: 43.7 % (ref 36.0–46.0)
Hemoglobin: 14.3 g/dL (ref 12.0–15.0)
MCH: 29.4 pg (ref 26.0–34.0)
MCHC: 32.7 g/dL (ref 30.0–36.0)
MCV: 89.9 fL (ref 78.0–100.0)
Platelets: 191 10*3/uL (ref 150–400)
RBC: 4.86 MIL/uL (ref 3.87–5.11)
RDW: 14.8 % (ref 11.5–15.5)
WBC: 7.1 10*3/uL (ref 4.0–10.5)

## 2014-07-04 LAB — BASIC METABOLIC PANEL
Anion gap: 12 (ref 5–15)
BUN: 10 mg/dL (ref 6–23)
CO2: 27 mEq/L (ref 19–32)
Calcium: 9.1 mg/dL (ref 8.4–10.5)
Chloride: 100 mEq/L (ref 96–112)
Creatinine, Ser: 0.79 mg/dL (ref 0.50–1.10)
GFR calc Af Amer: >90 mL/min (ref >90)
GFR calc non Af Amer: 87 mL/min — ABNORMAL LOW (ref >90)
Glucose, Bld: 104 mg/dL — ABNORMAL HIGH (ref 70–99)
Potassium: 4.2 mEq/L (ref 3.7–5.3)
Sodium: 139 mEq/L (ref 137–147)

## 2014-07-04 LAB — SURGICAL PCR SCREEN
MRSA, PCR: NEGATIVE
Staphylococcus aureus: NEGATIVE

## 2014-07-04 NOTE — Progress Notes (Addendum)
After reminding patient of heart tests done in 2005.Marland Kitchen.Marland Kitchen.Marland Kitchen.Had main c/o of chest discomfort then.  Worked up and tests were essentially negative, but there's mention of AAA, which pt denies.  Sees no cardio now, and has no problems now. (2 sisters committed suicide and son is incarcerated).  Cardio notes in chart.  I called [facility] (she sees dr. Theodis ShoveJunell Koberlein) [phone] to see if they have any additional info about ?? AAA Medical clearance note inside chart.  DA Spoke with Revonda StandardAllison, [place] concerning this patient.  Earlier studies indicate that no AAA is evident.  Revonda Standardllison had even consulted Dr. Tawanna Coolerodd Early on previous surgery, but little mention of an apparent AAA.

## 2014-07-05 MED ORDER — DEXAMETHASONE SODIUM PHOSPHATE 4 MG/ML IJ SOLN
4.0000 mg | Freq: Once | INTRAMUSCULAR | Status: DC
  Filled 2014-07-05: qty 1

## 2014-07-05 MED ORDER — ACETAMINOPHEN 10 MG/ML IV SOLN: 1000.0000 mg | Freq: Four times a day (QID) | INTRAVENOUS | Status: DC

## 2014-07-05 MED ORDER — VANCOMYCIN HCL IN DEXTROSE 1-5 GM/200ML-% IV SOLN
1000.0000 mg | INTRAVENOUS | Status: AC
  Administered 2014-07-06: 1000 mg via INTRAVENOUS
  Filled 2014-07-05: qty 200

## 2014-07-06 ENCOUNTER — Observation Stay (HOSPITAL_COMMUNITY)
Admission: RE | Admit: 2014-07-06 | Discharge: 2014-07-07 | Disposition: A | Discharge status: Home / Self Care | Payer: Medicaid Other | Source: Ambulatory Visit | Attending: Orthopedic Surgery | Admitting: Orthopedic Surgery

## 2014-07-06 ENCOUNTER — Encounter (HOSPITAL_COMMUNITY): Payer: Self-pay | Admitting: Anesthesiology

## 2014-07-06 ENCOUNTER — Ambulatory Visit (HOSPITAL_COMMUNITY): Payer: Medicaid Other

## 2014-07-06 ENCOUNTER — Observation Stay (HOSPITAL_COMMUNITY): Payer: Medicaid Other

## 2014-07-06 ENCOUNTER — Encounter (HOSPITAL_COMMUNITY)
Admission: RE | Disposition: A | Discharge status: Home / Self Care | Payer: Self-pay | Source: Ambulatory Visit | Attending: Orthopedic Surgery

## 2014-07-06 ENCOUNTER — Ambulatory Visit (HOSPITAL_COMMUNITY): Payer: Medicaid Other | Admitting: Anesthesiology

## 2014-07-06 DIAGNOSIS — M4320 Fusion of spine, site unspecified: Secondary | ICD-10-CM

## 2014-07-06 DIAGNOSIS — F17210 Nicotine dependence, cigarettes, uncomplicated: Secondary | ICD-10-CM | POA: Insufficient documentation

## 2014-07-06 DIAGNOSIS — Z419 Encounter for procedure for purposes other than remedying health state, unspecified: Secondary | ICD-10-CM

## 2014-07-06 DIAGNOSIS — M47892 Other spondylosis, cervical region: Secondary | ICD-10-CM | POA: Diagnosis not present

## 2014-07-06 DIAGNOSIS — J449 Chronic obstructive pulmonary disease, unspecified: Secondary | ICD-10-CM | POA: Insufficient documentation

## 2014-07-06 DIAGNOSIS — G8929 Other chronic pain: Secondary | ICD-10-CM | POA: Insufficient documentation

## 2014-07-06 DIAGNOSIS — F329 Major depressive disorder, single episode, unspecified: Secondary | ICD-10-CM | POA: Diagnosis not present

## 2014-07-06 DIAGNOSIS — Z880 Allergy status to penicillin: Secondary | ICD-10-CM | POA: Insufficient documentation

## 2014-07-06 DIAGNOSIS — F419 Anxiety disorder, unspecified: Secondary | ICD-10-CM | POA: Insufficient documentation

## 2014-07-06 DIAGNOSIS — M13851 Other specified arthritis, right hip: Secondary | ICD-10-CM | POA: Insufficient documentation

## 2014-07-06 DIAGNOSIS — R011 Cardiac murmur, unspecified: Secondary | ICD-10-CM | POA: Diagnosis not present

## 2014-07-06 DIAGNOSIS — I341 Nonrheumatic mitral (valve) prolapse: Secondary | ICD-10-CM | POA: Insufficient documentation

## 2014-07-06 DIAGNOSIS — M545 Low back pain: Secondary | ICD-10-CM | POA: Insufficient documentation

## 2014-07-06 DIAGNOSIS — M542 Cervicalgia: Secondary | ICD-10-CM | POA: Diagnosis present

## 2014-07-06 HISTORY — PX: ANTERIOR CERVICAL DECOMP/DISCECTOMY FUSION: SHX1161

## 2014-07-06 SURGERY — ANTERIOR CERVICAL DECOMPRESSION/DISCECTOMY FUSION 1 LEVEL
Anesthesia: General

## 2014-07-06 MED ORDER — GLYCOPYRROLATE 0.2 MG/ML IJ SOLN
INTRAMUSCULAR | Status: DC | PRN
  Administered 2014-07-06: .4 mg via INTRAVENOUS

## 2014-07-06 MED ORDER — NEOSTIGMINE METHYLSULFATE 10 MG/10ML IV SOLN
INTRAVENOUS | Status: AC
  Filled 2014-07-06: qty 1

## 2014-07-06 MED ORDER — BUPIVACAINE-EPINEPHRINE (PF) 0.25% -1:200000 IJ SOLN
INTRAMUSCULAR | Status: AC
  Filled 2014-07-06: qty 30

## 2014-07-06 MED ORDER — LACTATED RINGERS IV SOLN
INTRAVENOUS | Status: DC
  Administered 2014-07-06: 10:00:00 via INTRAVENOUS

## 2014-07-06 MED ORDER — OXYCODONE HCL 5 MG PO TABS
ORAL_TABLET | ORAL | Status: AC
  Administered 2014-07-06: 10 mg via ORAL
  Filled 2014-07-06: qty 2

## 2014-07-06 MED ORDER — GABAPENTIN 300 MG PO CAPS: 300.0000 mg | ORAL_CAPSULE | Freq: Four times a day (QID) | ORAL | Status: DC

## 2014-07-06 MED ORDER — SODIUM CHLORIDE 0.9 % IJ SOLN: 3.0000 mL | INTRAMUSCULAR | Status: DC | PRN

## 2014-07-06 MED ORDER — ONDANSETRON HCL 4 MG/2ML IJ SOLN: 4.0000 mg | INTRAMUSCULAR | Status: DC | PRN

## 2014-07-06 MED ORDER — BUPIVACAINE-EPINEPHRINE 0.25% -1:200000 IJ SOLN
INTRAMUSCULAR | Status: DC | PRN
  Administered 2014-07-06: 4 mL

## 2014-07-06 MED ORDER — GABAPENTIN 300 MG PO CAPS
300.0000 mg | ORAL_CAPSULE | Freq: Every morning | ORAL | Status: DC
  Administered 2014-07-07: 300 mg via ORAL
  Filled 2014-07-06: qty 1

## 2014-07-06 MED ORDER — MIDAZOLAM HCL 2 MG/2ML IJ SOLN
INTRAMUSCULAR | Status: AC
Start: 2014-07-06 — End: 2014-07-06
  Filled 2014-07-06: qty 2

## 2014-07-06 MED ORDER — EPHEDRINE SULFATE 50 MG/ML IJ SOLN
INTRAMUSCULAR | Status: DC | PRN
  Administered 2014-07-06: 10 mg via INTRAVENOUS

## 2014-07-06 MED ORDER — METHOCARBAMOL 1000 MG/10ML IJ SOLN
500.0000 mg | Freq: Four times a day (QID) | INTRAVENOUS | Status: DC | PRN
  Filled 2014-07-06: qty 5

## 2014-07-06 MED ORDER — DEXAMETHASONE SODIUM PHOSPHATE 4 MG/ML IJ SOLN
6.0000 mg | Freq: Four times a day (QID) | INTRAMUSCULAR | Status: DC
  Filled 2014-07-06 (×4): qty 1.5

## 2014-07-06 MED ORDER — 0.9 % SODIUM CHLORIDE (POUR BTL) OPTIME
TOPICAL | Status: DC | PRN
  Administered 2014-07-06: 1000 mL

## 2014-07-06 MED ORDER — HYDROMORPHONE HCL 1 MG/ML IJ SOLN
INTRAMUSCULAR | Status: AC
  Administered 2014-07-06: 0.5 mg via INTRAVENOUS
  Filled 2014-07-06: qty 1

## 2014-07-06 MED ORDER — LACTATED RINGERS IV SOLN: INTRAVENOUS | Status: DC

## 2014-07-06 MED ORDER — ACETAMINOPHEN 10 MG/ML IV SOLN
1000.0000 mg | Freq: Four times a day (QID) | INTRAVENOUS | Status: DC
  Administered 2014-07-06 – 2014-07-07 (×2): 1000 mg via INTRAVENOUS
  Filled 2014-07-06 (×3): qty 100

## 2014-07-06 MED ORDER — PROPOFOL 10 MG/ML IV BOLUS
INTRAVENOUS | Status: DC | PRN
  Administered 2014-07-06: 150 mg via INTRAVENOUS

## 2014-07-06 MED ORDER — PHENOL 1.4 % MT LIQD
1.0000 | OROMUCOSAL | Status: DC | PRN
  Administered 2014-07-07: 1 via OROMUCOSAL
  Filled 2014-07-06: qty 177

## 2014-07-06 MED ORDER — THROMBIN 20000 UNITS EX SOLR
CUTANEOUS | Status: DC | PRN
  Administered 2014-07-06: 20000 [IU] via TOPICAL

## 2014-07-06 MED ORDER — SODIUM CHLORIDE 0.9 % IV SOLN
250.0000 mL | INTRAVENOUS | Status: DC
  Administered 2014-07-06: 250 mL via INTRAVENOUS

## 2014-07-06 MED ORDER — PROMETHAZINE HCL 25 MG/ML IJ SOLN
6.2500 mg | INTRAMUSCULAR | Status: DC | PRN
Start: 2014-07-06 — End: 2014-07-06

## 2014-07-06 MED ORDER — BUPROPION HCL ER (SR) 150 MG PO TB12
150.0000 mg | ORAL_TABLET | Freq: Two times a day (BID) | ORAL | Status: DC
  Administered 2014-07-06 – 2014-07-07 (×2): 150 mg via ORAL
  Filled 2014-07-06 (×3): qty 1

## 2014-07-06 MED ORDER — GLYCOPYRROLATE 0.2 MG/ML IJ SOLN
INTRAMUSCULAR | Status: AC
  Filled 2014-07-06: qty 2

## 2014-07-06 MED ORDER — METHOCARBAMOL 500 MG PO TABS
500.0000 mg | ORAL_TABLET | Freq: Four times a day (QID) | ORAL | Status: DC | PRN
  Administered 2014-07-06: 500 mg via ORAL
  Filled 2014-07-06: qty 1

## 2014-07-06 MED ORDER — ROCURONIUM BROMIDE 100 MG/10ML IV SOLN
INTRAVENOUS | Status: DC | PRN
  Administered 2014-07-06 (×2): 50 mg via INTRAVENOUS

## 2014-07-06 MED ORDER — THROMBIN 20000 UNITS EX SOLR
CUTANEOUS | Status: AC
  Filled 2014-07-06: qty 20000

## 2014-07-06 MED ORDER — SODIUM CHLORIDE 0.9 % IJ SOLN: 3.0000 mL | Freq: Two times a day (BID) | INTRAMUSCULAR | Status: DC

## 2014-07-06 MED ORDER — FENTANYL CITRATE 0.05 MG/ML IJ SOLN
INTRAMUSCULAR | Status: DC | PRN
  Administered 2014-07-06 (×3): 100 ug via INTRAVENOUS
  Administered 2014-07-06: 50 ug via INTRAVENOUS

## 2014-07-06 MED ORDER — LACTATED RINGERS IV SOLN
INTRAVENOUS | Status: DC | PRN
  Administered 2014-07-06 (×2): via INTRAVENOUS

## 2014-07-06 MED ORDER — MORPHINE SULFATE 2 MG/ML IJ SOLN
1.0000 mg | INTRAMUSCULAR | Status: DC | PRN
  Administered 2014-07-06 – 2014-07-07 (×2): 2 mg via INTRAVENOUS
  Filled 2014-07-06: qty 1
  Filled 2014-07-06: qty 2

## 2014-07-06 MED ORDER — FENTANYL CITRATE 0.05 MG/ML IJ SOLN
INTRAMUSCULAR | Status: AC
  Filled 2014-07-06: qty 5

## 2014-07-06 MED ORDER — HYDROMORPHONE HCL 1 MG/ML IJ SOLN
0.2500 mg | INTRAMUSCULAR | Status: DC | PRN
  Administered 2014-07-06 (×4): 0.5 mg via INTRAVENOUS

## 2014-07-06 MED ORDER — ACETAMINOPHEN 10 MG/ML IV SOLN
INTRAVENOUS | Status: AC
  Filled 2014-07-06: qty 100

## 2014-07-06 MED ORDER — ONDANSETRON HCL 4 MG/2ML IJ SOLN
INTRAMUSCULAR | Status: AC
  Filled 2014-07-06: qty 2

## 2014-07-06 MED ORDER — LIDOCAINE HCL (CARDIAC) 20 MG/ML IV SOLN
INTRAVENOUS | Status: AC
  Filled 2014-07-06: qty 5

## 2014-07-06 MED ORDER — METHOCARBAMOL 500 MG PO TABS
ORAL_TABLET | ORAL | Status: AC
  Administered 2014-07-06: 500 mg via ORAL
  Filled 2014-07-06: qty 1

## 2014-07-06 MED ORDER — ALPRAZOLAM 0.5 MG PO TABS: 1.0000 mg | ORAL_TABLET | Freq: Four times a day (QID) | ORAL | Status: DC | PRN

## 2014-07-06 MED ORDER — NEOSTIGMINE METHYLSULFATE 10 MG/10ML IV SOLN
INTRAVENOUS | Status: DC | PRN
  Administered 2014-07-06: 3 mg via INTRAVENOUS

## 2014-07-06 MED ORDER — NALOXONE HCL 0.4 MG/ML IJ SOLN
INTRAMUSCULAR | Status: DC | PRN
  Administered 2014-07-06: 0.1 mg via INTRAVENOUS

## 2014-07-06 MED ORDER — ROCURONIUM BROMIDE 50 MG/5ML IV SOLN
INTRAVENOUS | Status: AC
  Filled 2014-07-06: qty 1

## 2014-07-06 MED ORDER — MIDAZOLAM HCL 5 MG/5ML IJ SOLN
INTRAMUSCULAR | Status: DC | PRN
  Administered 2014-07-06: 2 mg via INTRAVENOUS

## 2014-07-06 MED ORDER — HEMOSTATIC AGENTS (NO CHARGE) OPTIME
TOPICAL | Status: DC | PRN
  Administered 2014-07-06: 1 via TOPICAL

## 2014-07-06 MED ORDER — ALBUTEROL SULFATE HFA 108 (90 BASE) MCG/ACT IN AERS: 2.0000 | INHALATION_SPRAY | Freq: Four times a day (QID) | RESPIRATORY_TRACT | Status: DC | PRN

## 2014-07-06 MED ORDER — ACETAMINOPHEN 325 MG PO TABS
ORAL_TABLET | ORAL | Status: AC
  Filled 2014-07-06: qty 2

## 2014-07-06 MED ORDER — LIDOCAINE HCL (CARDIAC) 20 MG/ML IV SOLN
INTRAVENOUS | Status: DC | PRN
  Administered 2014-07-06: 70 mg via INTRAVENOUS

## 2014-07-06 MED ORDER — OXYCODONE HCL 5 MG PO TABS
10.0000 mg | ORAL_TABLET | ORAL | Status: DC | PRN
  Administered 2014-07-06 – 2014-07-07 (×5): 10 mg via ORAL
  Filled 2014-07-06 (×4): qty 2

## 2014-07-06 MED ORDER — PROPOFOL 10 MG/ML IV BOLUS
INTRAVENOUS | Status: AC
  Filled 2014-07-06: qty 20

## 2014-07-06 MED ORDER — MENTHOL 3 MG MT LOZG: 1.0000 | LOZENGE | OROMUCOSAL | Status: DC | PRN

## 2014-07-06 MED ORDER — GABAPENTIN 600 MG PO TABS
600.0000 mg | ORAL_TABLET | Freq: Every day | ORAL | Status: DC
  Administered 2014-07-06: 600 mg via ORAL
  Filled 2014-07-06 (×2): qty 1

## 2014-07-06 MED ORDER — PHENYLEPHRINE HCL 10 MG/ML IJ SOLN
INTRAMUSCULAR | Status: DC | PRN
  Administered 2014-07-06 (×3): 80 ug via INTRAVENOUS

## 2014-07-06 MED ORDER — VANCOMYCIN HCL IN DEXTROSE 1-5 GM/200ML-% IV SOLN
1000.0000 mg | Freq: Once | INTRAVENOUS | Status: AC
  Administered 2014-07-06: 1000 mg via INTRAVENOUS
  Filled 2014-07-06: qty 200

## 2014-07-06 MED ORDER — ALBUTEROL SULFATE (2.5 MG/3ML) 0.083% IN NEBU: 2.5000 mg | INHALATION_SOLUTION | Freq: Four times a day (QID) | RESPIRATORY_TRACT | Status: DC | PRN

## 2014-07-06 SURGICAL SUPPLY — 63 items
BLADE SURG ROTATE 9660 (MISCELLANEOUS) IMPLANT
BUR EGG ELITE 4.0 (BURR) IMPLANT
BUR EGG ELITE 4.0MM (BURR)
BUR MATCHSTICK NEURO 3.0 LAGG (BURR) IMPLANT
CANISTER SUCTION 2500CC (MISCELLANEOUS) ×3 IMPLANT
CLOSURE STERI-STRIP 1/2X4 (GAUZE/BANDAGES/DRESSINGS) ×1
CLOSURE WOUND 1/2 X4 (GAUZE/BANDAGES/DRESSINGS) ×1
CLSR STERI-STRIP ANTIMIC 1/2X4 (GAUZE/BANDAGES/DRESSINGS) ×2 IMPLANT
CORDS BIPOLAR (ELECTRODE) ×3 IMPLANT
COVER SURGICAL LIGHT HANDLE (MISCELLANEOUS) ×6 IMPLANT
CRADLE DONUT ADULT HEAD (MISCELLANEOUS) ×3 IMPLANT
DEVICE ENDSKLTN TC MED 8MM (Orthopedic Implant) IMPLANT
DRAPE C-ARM 42X72 X-RAY (DRAPES) ×3 IMPLANT
DRAPE POUCH INSTRU U-SHP 10X18 (DRAPES) ×3 IMPLANT
DRAPE SURG 17X23 STRL (DRAPES) ×3 IMPLANT
DRAPE U-SHAPE 47X51 STRL (DRAPES) ×3 IMPLANT
DRSG MEPILEX BORDER 4X4 (GAUZE/BANDAGES/DRESSINGS) ×3 IMPLANT
DURAPREP 26ML APPLICATOR (WOUND CARE) ×3 IMPLANT
ELECT COATED BLADE 2.86 ST (ELECTRODE) ×3 IMPLANT
ELECT PENCIL ROCKER SW 15FT (MISCELLANEOUS) ×3 IMPLANT
ELECT REM PT RETURN 9FT ADLT (ELECTROSURGICAL) ×3
ELECTRODE REM PT RTRN 9FT ADLT (ELECTROSURGICAL) ×1 IMPLANT
ENDOSKELTON TC IMPLANT 8MM MED (Orthopedic Implant) ×3 IMPLANT
GLOVE BIOGEL PI IND STRL 8 (GLOVE) ×1 IMPLANT
GLOVE BIOGEL PI IND STRL 8.5 (GLOVE) ×1 IMPLANT
GLOVE BIOGEL PI INDICATOR 8 (GLOVE) ×2
GLOVE BIOGEL PI INDICATOR 8.5 (GLOVE) ×2
GLOVE ECLIPSE 8.5 STRL (GLOVE) ×3 IMPLANT
GLOVE ORTHO TXT STRL SZ7.5 (GLOVE) ×3 IMPLANT
GOWN STRL REUS W/ TWL XL LVL3 (GOWN DISPOSABLE) ×1 IMPLANT
GOWN STRL REUS W/TWL 2XL LVL3 (GOWN DISPOSABLE) ×6 IMPLANT
GOWN STRL REUS W/TWL XL LVL3 (GOWN DISPOSABLE) ×3
KIT BASIN OR (CUSTOM PROCEDURE TRAY) ×3 IMPLANT
KIT ROOM TURNOVER OR (KITS) ×3 IMPLANT
NDL SPNL 18GX3.5 QUINCKE PK (NEEDLE) ×1 IMPLANT
NEEDLE SPNL 18GX3.5 QUINCKE PK (NEEDLE) ×3 IMPLANT
NS IRRIG 1000ML POUR BTL (IV SOLUTION) ×3 IMPLANT
PACK ORTHO CERVICAL (CUSTOM PROCEDURE TRAY) ×3 IMPLANT
PACK UNIVERSAL I (CUSTOM PROCEDURE TRAY) ×3 IMPLANT
PAD ARMBOARD 7.5X6 YLW CONV (MISCELLANEOUS) ×6 IMPLANT
PATTIES SURGICAL .25X.25 (GAUZE/BANDAGES/DRESSINGS) IMPLANT
PIN RETAINER PRODISC 14 MM (PIN) ×2 IMPLANT
PLATE ONE LEVEL SKYLINE 14MM (Plate) ×2 IMPLANT
PUTTY BONE DBX 2.5 MIS (Bone Implant) ×2 IMPLANT
RESTRAINT LIMB HOLDER UNIV (RESTRAINTS) ×3 IMPLANT
SCREW SKYLINE 14MM SD-VA (Screw) ×2 IMPLANT
SPONGE INTESTINAL PEANUT (DISPOSABLE) ×3 IMPLANT
SPONGE SURGIFOAM ABS GEL 100 (HEMOSTASIS) ×3 IMPLANT
STRIP CLOSURE SKIN 1/2X4 (GAUZE/BANDAGES/DRESSINGS) ×1 IMPLANT
SURGIFLO TRUKIT (HEMOSTASIS) ×2 IMPLANT
SUT BONE WAX W31G (SUTURE) ×3 IMPLANT
SUT MON AB 3-0 SH 27 (SUTURE) ×3
SUT MON AB 3-0 SH27 (SUTURE) ×1 IMPLANT
SUT SILK 2 0 (SUTURE)
SUT SILK 2-0 18XBRD TIE 12 (SUTURE) IMPLANT
SUT VIC AB 2-0 CT1 18 (SUTURE) ×3 IMPLANT
SYR BULB IRRIGATION 50ML (SYRINGE) ×3 IMPLANT
SYR CONTROL 10ML LL (SYRINGE) ×3 IMPLANT
TAPE CLOTH 4X10 WHT NS (GAUZE/BANDAGES/DRESSINGS) ×3 IMPLANT
TAPE UMBILICAL COTTON 1/8X30 (MISCELLANEOUS) ×3 IMPLANT
TOWEL OR 17X24 6PK STRL BLUE (TOWEL DISPOSABLE) ×3 IMPLANT
TOWEL OR 17X26 10 PK STRL BLUE (TOWEL DISPOSABLE) ×3 IMPLANT
WATER STERILE IRR 1000ML POUR (IV SOLUTION) ×3 IMPLANT

## 2014-07-06 NOTE — Brief Op Note (Signed)
07/06/2014  11:59 AM  PATIENT:  Elizabeth Fowler  63 y.o. female  PRE-OPERATIVE DIAGNOSIS:  C5-6 DD WITH LEFT RADICULAR ARM PAIN  POST-OPERATIVE DIAGNOSIS:  C5-6 DD WITH LEFT RADICULAR ARM PAIN  PROCEDURE:  Procedure(s): ANTERIOR CERVICAL DECOMPRESSION/DISCECTOMY FUSION 1 LEVEL (N/A)  SURGEON:  Surgeon(s) and Role:    * Venita Lickahari Freddrick Gladson, MD - Primary  PHYSICIAN ASSISTANT:   ASSISTANTS: none   ANESTHESIA:   general  EBL:  Total I/O In: 1000 [I.V.:1000] Out: -   BLOOD ADMINISTERED:none  DRAINS: none   LOCAL MEDICATIONS USED:  MARCAINE     SPECIMEN:  No Specimen  DISPOSITION OF SPECIMEN:  N/A  COUNTS:  YES  TOURNIQUET:  * No tourniquets in log *  DICTATION: .Other Dictation: Dictation Number [id number]  PLAN OF CARE: Admit for overnight observation  PATIENT DISPOSITION:  PACU - hemodynamically stable.

## 2014-07-06 NOTE — Transfer of Care (Signed)
Immediate Anesthesia Transfer of Care Note  Patient: Elizabeth JarvisWanda H Quiles  Procedure(s) Performed: Procedure(s): ANTERIOR CERVICAL DECOMPRESSION/DISCECTOMY FUSION 1 LEVEL (N/A)  Patient Location: PACU  Anesthesia Type:General  Level of Consciousness: awake and alert   Airway & Oxygen Therapy: Patient Spontanous Breathing and Patient connected to nasal cannula oxygen  Post-op Assessment: Report given to PACU RN and Post -op Vital signs reviewed and stable  Post vital signs: Reviewed and stable  Complications: No apparent anesthesia complications

## 2014-07-06 NOTE — Progress Notes (Signed)
ANTIBIOTIC CONSULT NOTE - INITIAL  Pharmacy Consult for Vancomycin Indication: Surgical Prophylaxis  Allergies  Allergen Reactions  . Penicillins Swelling    Facial swelling   Patient Measurements: Height: 5' 1.5" (156.2 cm) Weight: 120 lb (54.432 kg) IBW/kg (Calculated) : 48.95 Vital Signs: Temp: 97.5 F (36.4 C) (12/17 1500) Temp Source: Oral (12/17 0840) BP: 144/65 mmHg (12/17 1500) Pulse Rate: 82 (12/17 1500) Intake/Output from previous day:   Intake/Output from this shift: Total I/O In: 1400 [I.V.:1400] Out: 600 [Urine:600]  Labs:  Recent Labs  07/04/14 1027  WBC 7.1  HGB 14.3  PLT 191  CREATININE 0.79   Estimated Creatinine Clearance: 55.7 mL/min (by C-G formula based on Cr of 0.79). No results for input(s): VANCOTROUGH, VANCOPEAK, VANCORANDOM, GENTTROUGH, GENTPEAK, GENTRANDOM, TOBRATROUGH, TOBRAPEAK, TOBRARND, AMIKACINPEAK, AMIKACINTROU, AMIKACIN in the last 72 hours.   Microbiology: Recent Results (from the past 720 hour(s))  Surgical pcr screen     Status: None   Collection Time: 07/04/14 10:26 AM  Result Value Ref Range Status   MRSA, PCR NEGATIVE NEGATIVE Final   Staphylococcus aureus NEGATIVE NEGATIVE Final    Comment:        The Xpert SA Assay (FDA approved for NASAL specimens in patients over 63 years of age), is one component of a comprehensive surveillance program.  Test performance has been validated by [facility] for patients greater than or equal to 63 year old. It is not intended to diagnose infection nor to guide or monitor treatment.     Medical History: Past Medical History  Diagnosis Date  . Anxiety   . Depression   . Heart murmur   . MVP (mitral valve prolapse)     mild anterior leafleat MVP with mild MR 02/26/04 echo  . Pneumonia     "I've had pneumonia probably 30 times" (08/31/2013)  . Chronic bronchitis     "got it q yr til pneumonia shot given" (08/31/2013)  . Migraines     "goes to bed dark room; last migraine  maybe 5 yrs ago" (08/31/2013)  . Arthritis     "right hip" (08/31/2013)  . Chronic lower back pain   . COPD (chronic obstructive pulmonary disease)   . Shingles     3-4 MTHS AGO   Medications:  Anti-infectives    Start     Dose/Rate Route Frequency Ordered Stop   07/05/14 1340  vancomycin (VANCOCIN) IVPB 1000 mg/200 mL premix     1,000 mg200 mL/hr over 60 Minutes Intravenous 60 min pre-op 07/05/14 1340 07/06/14 1115     Assessment: 63 year old female status post anterior cervical decompression/discetomy fusion without a drain in place to receive vancomycin x1 dose post-operatively.   ID: WBC within normal limits. Afebrile. Pre-op vancomycin 1g IV charted at 10AM. SCr 0.79/estimated CrCl ~55 mL/min.   Goal of Therapy:  Vancomycin trough level 15-20 mcg/ml  Plan:  Vancomycin 1g IV x1 dose 12 hours post-op.   Link SnufferJessica Alva Broxson, PharmD, BCPS Clinical Pharmacist [phone] 07/06/2014,3:49 PM

## 2014-07-06 NOTE — Progress Notes (Signed)
Occupational Therapy Evaluation Patient Details Name: Elizabeth Fowler MRN: [id number] DOB: 1950-11-19 Today's Date: 07/06/2014    History of Present Illness Elizabeth Fowler is a 63 y.o. Female s/p ACDF 1 Level on 07/06/14. Pt reports back surgery in Feb 2015.    Clinical Impression   PTA pt lived at home with her husband and was independent with ADLs. Pt currently limited by pain and decreased ROM from cervical precautions. Pt will benefit from one additional acute OT session to address LB ADLs and safety with ADLs at home, including functional mobility.      Follow Up Recommendations  No OT follow up;Supervision/Assistance - 24 hour;Other (comment) (pt may consider OP OT as she progresses to rehab LUE)    Equipment Recommendations  None recommended by OT    Recommendations for Other Services       Precautions / Restrictions Precautions Precautions: Cervical Precaution Comments: Provided pt with cervical handout and educated on precautions and incorporating into ADLs.  Required Braces or Orthoses: Cervical Brace Cervical Brace: Hard collar Restrictions Weight Bearing Restrictions: No      Mobility Bed Mobility               General bed mobility comments: Educated pt on leg roll technique.   Transfers                 General transfer comment: Not completed at this time. Pt declined, however was willing to work with PT following OT session and deferred to PT. Observed pt ambulating in hallway with min guard assist.           ADL Overall ADL's : Needs assistance/impaired Eating/Feeding: Independent;Sitting   Grooming: Set up;Sitting   Upper Body Bathing: Set up;Sitting       Upper Body Dressing : Minimal assistance;Sitting Upper Body Dressing Details (indicate cue type and reason): due to hard collar                   General ADL Comments: Educated pt on incorporating cervical precautions into ADLs. Educated pt on fall prevention and safety  with ADLs including compensatory techniques. Following OT session, pt willing to get OOB and work with PT and observed ambulating in hallway with no AD and Min guard.     Vision  Pt reports no change from baseline.                    Perception Perception Perception Tested?: No   Praxis Praxis Praxis tested?: Within functional limits    Pertinent Vitals/Pain Pain Assessment: 0-10 Pain Score:  (not rated) Pain Location: "sore throat" Pain Descriptors / Indicators: Sore Pain Intervention(s): Monitored during session;Repositioned     Hand Dominance Right   Extremity/Trunk Assessment Upper Extremity Assessment Upper Extremity Assessment: Overall WFL for tasks assessed;LUE deficits/detail LUE Deficits / Details: LUE shoulder FF limited to ~85 degrees due to pain and UE weakness from radiculopathy.  LUE Coordination: decreased gross motor   Lower Extremity Assessment Lower Extremity Assessment: Defer to PT evaluation   Cervical / Trunk Assessment Cervical / Trunk Assessment: Normal (cervical surgery)   Communication Communication Communication: No difficulties   Cognition Arousal/Alertness: Awake/alert Behavior During Therapy: WFL for tasks assessed/performed Overall Cognitive Status: Within Functional Limits for tasks assessed                                Home Living Family/patient expects to be discharged  to:: Private residence Living Arrangements: Spouse/significant other Available Help at Discharge: Family;Available 24 hours/day Type of Home: House (modular) Home Access: Stairs to enter Entergy CorporationEntrance Stairs-Number of Steps: 4   Home Layout: One level     Bathroom Shower/Tub: Tub/shower unit;Curtain Shower/tub characteristics: Engineer, building servicesCurtain Bathroom Toilet: Standard     Home Equipment: Environmental consultantWalker - 4 wheels;Bedside commode;Shower seat;Wheelchair - manual;Grab bars - tub/shower;Grab bars - toilet          Prior Functioning/Environment Level of  Independence: Independent        Comments: pt has hx of back surgery in Feb.     OT Diagnosis: Acute pain   OT Problem List: Decreased range of motion;Decreased strength;Decreased knowledge of use of DME or AE;Decreased knowledge of precautions;Impaired UE functional use;Pain   OT Treatment/Interventions: Self-care/ADL training;Therapeutic exercise;Energy conservation;Therapeutic activities;Patient/family education    OT Goals(Current goals can be found in the care plan section) Acute Rehab OT Goals Patient Stated Goal: to get back to living OT Goal Formulation: With patient Time For Goal Achievement: 07/20/14 Potential to Achieve Goals: Good ADL Goals Pt Will Perform Grooming: with modified independence;standing Pt Will Perform Lower Body Bathing: with modified independence;sit to/from stand Pt Will Perform Upper Body Dressing: with modified independence;sitting Pt Will Perform Lower Body Dressing: with modified independence;sit to/from stand Pt Will Transfer to Toilet: with modified independence;ambulating Pt Will Perform Toileting - Clothing Manipulation and hygiene: with modified independence;sit to/from stand Pt Will Perform Tub/Shower Transfer: with modified independence;ambulating Pt/caregiver will Perform Home Exercise Program: Left upper extremity;Increased strength;Independently  OT Frequency: Min 2X/week    End of Session Equipment Utilized During Treatment: Cervical collar  Activity Tolerance: Patient tolerated treatment well Patient left: in bed;with call bell/phone within reach   Time: [id number] OT Time Calculation (min): 11 min Charges:  OT General Charges $OT Visit: 1 Procedure OT Evaluation $Initial OT Evaluation Tier I: 1 Procedure G-Codes: OT G-codes **NOT FOR INPATIENT CLASS** Functional Assessment Tool Used: clinical judgement Functional Limitation: Self care Self Care Current Status (V4098(G8987): At least 1 percent but less than 20 percent impaired,  limited or restricted Self Care Goal Status (J1914(G8988): At least 1 percent but less than 20 percent impaired, limited or restricted  Rae LipsMiller, Craigory Toste M 07/06/2014, 5:04 PM   Carney LivingLeeAnn Marie Jule Whitsel, OTR/L Occupational Therapist [phone] (pager)

## 2014-07-06 NOTE — Progress Notes (Signed)
Report given to Jamie RN

## 2014-07-06 NOTE — H&P (Signed)
History of Present Illness  The patient is a 63 year old female who presents today for follow up of their neck. The patient is being followed for their neck pain (and she returns 2 weeks after her left C6 SNRB w/Lidocaine only which did help for several hours)  Symptoms reported today include: pain ("pulling pain with the left arm hanging "), pain at night, aching, throbbing and numbness (right arm to the hand). The patient feels that they are doing poorly and report their pain level to be 7.5 / 10 ("that is with medication"). Current treatment includes: pain medications. The following medication has been used for pain control: Hydrocodone (Hydromorphone from Dr Regino SchultzeMcGough). Note for "Follow-up Neck": The patient has been evaluated by Dr Thomasena Edisollins and told her pain is mostly neck related. The patient wants to discuss surgery.  Subjective Transcription She returns today for a followup. The patient continues to have significant neck and left arm pain.  Allergies Codeine/Codeine Derivatives Penicillins Darvon *ANALGESICS - OPIOID*  Family History Congestive Heart Failure grandfather mothers side Drug / Alcohol Addiction mother Heart Disease mother, grandmother mothers side and grandmother fathers side Heart disease in female family member before age 63 Heart disease in female family member before age 455 Osteoarthritis grandmother mothers side Osteoporosis grandmother mothers side and grandfather mothers side Rheumatoid Arthritis mother, sister, grandmother mothers side and grandmother fathers side Depression First Degree Relatives. mother and sister  Social History  Exercise Exercises never Alcohol use former drinker Children 2 Tobacco use Current some day smoker. current some days smoker; smoke(d) less than 1/2 pack(s) per day Drug/Alcohol Rehab (Currently) no Drug/Alcohol Rehab (Previously) no Living situation live with spouse Marital status married Tobacco / smoke  exposure yes outdoors only Current work status disabled Pain Contract no Illicit drug use no  Medication History Neurontin (300MG  Capsule, 1 (one) Oral three times daily, Taken starting 09/02/2013) Active. ALPRAZolam (1MG  Tablet, Oral as needed) Active. Diazepam (10MG  Tablet, 1 Oral every six hours, as needed) Active. HYDROmorphone HCl (8MG  Tablet, 1 Oral every six hours, as needed) Active. Medications Reconciled  Objective Transcription She has numbness and dysesthesias in the C6 distribution. She has no focal motor deficits. Significant neck pain with palpation and range of motion. There is a positive Spurling's sign. Normal gait pattern. No shortness of breath or chest pain. Lungs are clear to auscultation. No soft tissue swelling in the cervical spine region.  At this point in time, the patient did have her C6 left selective nerve root block and had a positive result. She had absolutely no pain for several hours. Then she had mild pain for a few days afterwards. The pain has now since returned, but she did note that she truly felt better after the injection.  Assessment & Plan Anterior cervical fusion:Risks of surgery include, but are not limited to: Throat pain, swallowing difficulty, hoarseness or change in voice, death, stroke, paralysis, nerve root damage/injury, bleeding, blood clots, loss of bowel/bladder control, hardware failure, or mal-position, spinal fluid leak, adjacent segment disease, non-union, need for further surgery, ongoing or worse pain, infection. Post-operative bleeding or swelling that could require emergent surgery. Goal Of Surgery:Discussed that goal of surgery is to reduce pain and improve function and quality of life. Patient is aware that despite all appropriate treatment that there pain and function could be the same, worse, or different.  Plans Transcription Given the correlation between her symptoms, MRI findings and now pain relief with her  injection, I feel more confident proceeding with  the ACDF.  I did review the risk with him to include infection, bleeding, nerve damage, death , stroke, paralysis, failure to heal, ongoing or worse pain, need for further surgery, loss of bowel and/or bladder control, throat pain, swallowing difficulties, hoarseness in the voice, non-union meaning it does not fuse. Need for further surgery from the front or from the back.

## 2014-07-06 NOTE — Anesthesia Preprocedure Evaluation (Signed)
Anesthesia Evaluation  Patient identified by MRN, date of birth, ID band Patient awake    Reviewed: Allergy & Precautions, NPO status , Patient's Chart, lab work & pertinent test results  Airway Mallampati: II   Neck ROM: Full    Dental  (+) Poor Dentition, Missing, Dental Advisory Given   Pulmonary COPDCurrent Smoker,  breath sounds clear to auscultation        Cardiovascular negative cardio ROS  Rhythm:Regular Rate:Normal     Neuro/Psych  Headaches,    GI/Hepatic negative GI ROS, Neg liver ROS,   Endo/Other  negative endocrine ROS  Renal/GU negative Renal ROS     Musculoskeletal  (+) Arthritis -,   Abdominal   Peds  Hematology negative hematology ROS (+)   Anesthesia Other Findings   Reproductive/Obstetrics                             Anesthesia Physical Anesthesia Plan  ASA: III  Anesthesia Plan: General   Post-op Pain Management:    Induction: Intravenous  Airway Management Planned: Oral ETT  Additional Equipment:   Intra-op Plan:   Post-operative Plan: Extubation in OR  Informed Consent: I have reviewed the patients History and Physical, chart, labs and discussed the procedure including the risks, benefits and alternatives for the proposed anesthesia with the patient or authorized representative who has indicated his/her understanding and acceptance.   Dental advisory given  Plan Discussed with: CRNA and Surgeon  Anesthesia Plan Comments:         Anesthesia Quick Evaluation

## 2014-07-06 NOTE — Evaluation (Signed)
Physical Therapy Evaluation Patient Details Name: Elizabeth Fowler MRN: [id number] DOB: 10-03-1950 Today's Date: 07/06/2014   History of Present Illness  Elizabeth JarvisWanda H. Fowler is a 63 y.o. Female s/p ACDF 1 Level on 07/06/14. Pt reports back surgery in Feb 2015.   Clinical Impression  Patient s/p above surgery and presents with mild balance deficits impacting safe mobility. Discussed using RW initially to improve balance at home as pt using one PTA. Tolerated ambulation and transfers with Min guard assist for safety. Reviewed cervical precautions. Need to assess ability to negotiate steps next session. Pt would benefit from skilled PT to improve gait, balance and mobility so pt can maximize independence and return to PLOF.    Follow Up Recommendations No PT follow up;Supervision/Assistance - 24 hour    Equipment Recommendations  None recommended by PT    Recommendations for Other Services       Precautions / Restrictions Precautions Precautions: Cervical Precaution Comments: Reviewed cervical precautions. Required Braces or Orthoses: Cervical Brace Cervical Brace: Hard collar Restrictions Weight Bearing Restrictions: No      Mobility  Bed Mobility Overal bed mobility: Needs Assistance Bed Mobility: Sit to Sidelying;Rolling Rolling: Supervision       Sit to sidelying: Supervision;HOB elevated General bed mobility comments: Educated pt on log roll technique. Use of rails for support.  Transfers Overall transfer level: Needs assistance Equipment used: None Transfers: Sit to/from Stand Sit to Stand: Supervision         General transfer comment: Supervision to stand from EOB and from toilet x1.   Ambulation/Gait Ambulation/Gait assistance: Min guard Ambulation Distance (Feet): 400 Feet Assistive device: None Gait Pattern/deviations: Step-through pattern;Decreased stride length;Narrow base of support   Gait velocity interpretation: Below normal speed for age/gender General  Gait Details: Decreased arm swing bilaterally. Cues to adhere to cervical precautions during gait. Mildly unsteady.  Stairs            Wheelchair Mobility    Modified Rankin (Stroke Patients Only)       Balance Overall balance assessment: Needs assistance Sitting-balance support: Feet supported;No upper extremity supported Sitting balance-Leahy Scale: Good     Standing balance support: During functional activity Standing balance-Leahy Scale: Fair Standing balance comment: Able to perform dynamic standing without UE support- washing hands at sink and pericare                             Pertinent Vitals/Pain Pain Assessment: No/denies pain Pain Score:  (not rated) Pain Location: "sore throat" Pain Descriptors / Indicators: Sore Pain Intervention(s): Monitored during session;Repositioned    Home Living Family/patient expects to be discharged to:: Private residence Living Arrangements: Spouse/significant other Available Help at Discharge: Family;Available 24 hours/day Type of Home: House Home Access: Stairs to enter Entrance Stairs-Rails: Right Entrance Stairs-Number of Steps: 4 Home Layout: One level Home Equipment: Bedside commode;Shower seat;Wheelchair - manual;Grab bars - tub/shower;Grab bars - toilet;Walker - 2 wheels;Walker - 4 wheels      Prior Function Level of Independence: Independent with assistive device(s)         Comments: Pt using RW PTA per report due to fear of falling. Reports fall ~6 months ago. Hx back surgery in Feb     Hand Dominance   Dominant Hand: Right    Extremity/Trunk Assessment   Upper Extremity Assessment: Defer to OT evaluation       LUE Deficits / Details: LUE shoulder FF limited to ~85 degrees due to  pain and UE weakness from radiculopathy.    Lower Extremity Assessment: Generalized weakness      Cervical / Trunk Assessment: Normal  Communication   Communication: No difficulties  Cognition  Arousal/Alertness: Awake/alert Behavior During Therapy: WFL for tasks assessed/performed Overall Cognitive Status: Within Functional Limits for tasks assessed                      General Comments      Exercises        Assessment/Plan    PT Assessment Patient needs continued PT services  PT Diagnosis Difficulty walking;Generalized weakness   PT Problem List Decreased strength;Decreased balance;Decreased mobility  PT Treatment Interventions Balance training;Gait training;Neuromuscular re-education;Stair training;Patient/family education;Functional mobility training;Therapeutic activities;Therapeutic exercise   PT Goals (Current goals can be found in the Care Plan section) Acute Rehab PT Goals Patient Stated Goal: to return to independence PT Goal Formulation: With patient Time For Goal Achievement: 07/20/14 Potential to Achieve Goals: Good    Frequency Min 5X/week   Barriers to discharge        Co-evaluation               End of Session Equipment Utilized During Treatment: Gait belt Activity Tolerance: Patient tolerated treatment well Patient left: in bed;with call bell/phone within reach Nurse Communication: Mobility status    Functional Assessment Tool Used: Clinical judgment Functional Limitation: Mobility: Walking and moving around Mobility: Walking and Moving Around Current Status ([id number](G8978): At least 1 percent but less than 20 percent impaired, limited or restricted Mobility: Walking and Moving Around Goal Status [phone]): At least 1 percent but less than 20 percent impaired, limited or restricted    Time: [id number] PT Time Calculation (min) (ACUTE ONLY): 16 min   Charges:   PT Evaluation $Initial PT Evaluation Tier I: 1 Procedure PT Treatments $Gait Training: 8-22 mins   PT G Codes:   Functional Assessment Tool Used: Clinical judgment Functional Limitation: Mobility: Walking and moving around    [place]olan, [place] A 07/06/2014, 5:10 PM  Alvie HeidelbergShauna  Folan, PT, DPT [phone]

## 2014-07-06 NOTE — Progress Notes (Signed)
PT Cancellation Note  Patient Details Name: Elizabeth Fowler MRN: [id number] DOB: 09-26-1950   Cancelled Treatment:    Reason Eval/Treat Not Completed: Patient declined, no reason specified OT just finished walking out of room and pt refusing any mobility at this time with OT/PT. Will follow up next available time.   Alvie HeidelbergFolan, [name] 07/06/2014, 4:47 PM  Alvie HeidelbergShauna Folan, PT, DPT [phone]

## 2014-07-07 ENCOUNTER — Encounter (HOSPITAL_COMMUNITY): Payer: Self-pay | Admitting: Orthopedic Surgery

## 2014-07-07 DIAGNOSIS — M47892 Other spondylosis, cervical region: Secondary | ICD-10-CM | POA: Diagnosis not present

## 2014-07-07 MED ORDER — ONDANSETRON HCL 4 MG PO TABS: 4.0000 mg | ORAL_TABLET | Freq: Three times a day (TID) | ORAL | Status: DC | PRN

## 2014-07-07 MED ORDER — METHOCARBAMOL 500 MG PO TABS: 500.0000 mg | ORAL_TABLET | Freq: Three times a day (TID) | ORAL | Status: DC

## 2014-07-07 NOTE — Progress Notes (Signed)
UR completed 

## 2014-07-07 NOTE — Care Management Note (Signed)
CARE MANAGEMENT NOTE 07/07/2014  Patient:  Elizabeth Fowler,Elizabeth Fowler   Account Number:  [id number]  Date Initiated:  07/07/2014  Documentation initiated by:  Vance PeperBRADY,Cimone Fahey  Subjective/Objective Assessment:   63 yr old female admitted with C5-6 DD with leftradicular arm pain. Patient had  an ACDF.     Action/Plan:   No home health needs identified by therapists or Case manager.   Anticipated DC Date:  07/07/2014   Anticipated DC Plan:  HOME/SELF CARE      DC Planning Services  CM consult      PAC Choice  NA   Choice offered to / List presented to:     DME arranged  NA        HH arranged  NA      Status of service:  Completed, signed off Medicare Important Message given?   (If response is "NO", the following Medicare IM given date fields will be blank) Date Medicare IM given:   Medicare IM given by:   Date Additional Medicare IM given:   Additional Medicare IM given by:    Discharge Disposition:  HOME/SELF CARE  Per UR Regulation:  Reviewed for med. necessity/level of care/duration of stay  If discussed at Long Length of Stay Meetings, dates discussed:    Comments:

## 2014-07-07 NOTE — Discharge Instructions (Signed)

## 2014-07-07 NOTE — Discharge Summary (Signed)
Patient ID: Elizabeth Fowler MRN: [id number] DOB/AGE: 10/19/1950 63 y.o.  Admit date: 07/06/2014 Discharge date: 07/07/2014  Admission Diagnoses:  Active Problems:   Neck pain   Discharge Diagnoses:  Active Problems:   Neck pain  status post Procedure(s): ANTERIOR CERVICAL DECOMPRESSION/DISCECTOMY FUSION 1 LEVEL  Past Medical History  Diagnosis Date  . Anxiety   . Depression   . Heart murmur   . MVP (mitral valve prolapse)     mild anterior leafleat MVP with mild MR 02/26/04 echo  . Pneumonia     "I've had pneumonia probably 30 times" (08/31/2013)  . Chronic bronchitis     "got it q yr til pneumonia shot given" (08/31/2013)  . Migraines     "goes to bed dark room; last migraine maybe 5 yrs ago" (08/31/2013)  . Arthritis     "right hip" (08/31/2013)  . Chronic lower back pain   . COPD (chronic obstructive pulmonary disease)   . Shingles     3-4 MTH[facility] AGO    Surgeries: Procedure(s): ANTERIOR CERVICAL DECOMPRESSION/DISCECTOMY FUSION 1 LEVEL on 07/06/2014   Consultants:    Discharged Condition: Improved  Hospital Course: Elizabeth Fowler is an 63 y.o. female who was admitted 07/06/2014 for operative treatment of <principal problem not specified>. Patient failed conservative treatments (please see the history and physical for the specifics) and had severe unremitting pain that affects sleep, daily activities and work/hobbies. After pre-op clearance, the patient was taken to the operating room on 07/06/2014 and underwent  Procedure(s): ANTERIOR CERVICAL DECOMPRESSION/DISCECTOMY FUSION 1 LEVEL.    Patient was given perioperative antibiotics: Anti-infectives    Start     Dose/Rate Route Frequency Ordered Stop   07/06/14 2200  vancomycin (VANCOCIN) IVPB 1000 mg/200 mL premix     1,000 mg200 mL/hr over 60 Minutes Intravenous  Once 07/06/14 1554 07/06/14 2249   07/05/14 1340  vancomycin (VANCOCIN) IVPB 1000 mg/200 mL premix     1,000 mg200 mL/hr over 60 Minutes Intravenous 60  min pre-op 07/05/14 1340 07/06/14 1115       Patient was given sequential compression devices and early ambulation to prevent DVT.   Patient benefited maximally from hospital stay and there were no complications. At the time of discharge, the patient was urinating/moving their bowels without difficulty, tolerating a regular diet, pain is controlled with oral pain medications and they have been cleared by PT/OT.   Recent vital signs: Patient Vitals for the past 24 hrs:  BP Temp Temp src Pulse Resp SpO2 Height Weight  07/07/14 0531 (!) 137/59 mmHg 97.7 F (36.5 C) - 91 16 92 % - -  07/07/14 0200 129/73 mmHg 97.6 F (36.4 C) - (!) 101 16 94 % - -  07/06/14 2158 126/65 mmHg 97.7 F (36.5 C) - 87 14 91 % - -  07/06/14 1500 (!) 144/65 mmHg 97.5 F (36.4 C) - 82 16 97 % - -  07/06/14 1430 (!) 152/65 mmHg - - 74 10 97 % - -  07/06/14 1415 139/61 mmHg - - 71 10 97 % - -  07/06/14 1400 (!) 156/69 mmHg - - 73 14 97 % - -  07/06/14 1345 (!) 156/63 mmHg - - 80 18 99 % - -  07/06/14 1330 (!) 156/67 mmHg - - 76 14 98 % - -  07/06/14 1315 (!) 156/70 mmHg - - 73 17 98 % - -  07/06/14 1300 (!) 155/68 mmHg - - 79 (!) 22 100 % - -  07/06/14 1245 (!) 145/72 mmHg - - 82 19 98 % - -  07/06/14 1230 (!) 157/74 mmHg - - 97 12 97 % - -  07/06/14 1225 (!) 157/74 mmHg 97.3 F (36.3 C) - 94 15 98 % - -  07/06/14 0840 (!) 113/50 mmHg 98.1 F (36.7 C) Oral 76 20 96 % 5' 1.5" (1.562 m) 54.432 kg (120 lb)     Recent laboratory studies:  Recent Labs  07/04/14 1027  WBC 7.1  HGB 14.3  HCT 43.7  PLT 191  NA 139  K 4.2  CL 100  CO2 27  BUN 10  CREATININE 0.79  GLUCOSE 104*  CALCIUM 9.1     Discharge Medications:     Medication List    STOP taking these medications        diazepam 10 MG tablet  Commonly known as:  VALIUM      TAKE these medications        albuterol 108 (90 BASE) MCG/ACT inhaler  Commonly known as:  PROVENTIL HFA;VENTOLIN HFA  Inhale 2 puffs into the lungs every 6 (six)  hours as needed for wheezing or shortness of breath.     ALPRAZolam 1 MG tablet  Commonly known as:  XANAX  Take 1 mg by mouth 4 (four) times daily as needed for anxiety.     buPROPion 150 MG 12 hr tablet  Commonly known as:  WELLBUTRIN SR  Take 150 mg by mouth 2 (two) times daily. Once daily for the first 7 days then twice daily     gabapentin 300 MG capsule  Commonly known as:  NEURONTIN  Take 300-600 mg by mouth 2 (two) times daily. 300mg  in the AM and 600mg  in the PM     HYDROmorphone 8 MG tablet  Commonly known as:  DILAUDID  Take 8 mg by mouth every 6 (six) hours as needed for severe pain.     methocarbamol 500 MG tablet  Commonly known as:  ROBAXIN  Take 1 tablet (500 mg total) by mouth 3 (three) times daily.     ondansetron 4 MG tablet  Commonly known as:  ZOFRAN  Take 1 tablet (4 mg total) by mouth every 8 (eight) hours as needed for nausea or vomiting.        Diagnostic Studies: Dg Cervical Spine 2 Or 3 Views  07/06/2014   CLINICAL DATA:  Postop fusion.  EXAM: CERVICAL SPINE - 2-3 VIEW  COMPARISON:  Intraoperative fluoroscopy from the same day  FINDINGS: C5-6 anterior cervical discectomy and fusion with ventral plate and screw fixation. The intervertebral metallic graft is well-positioned. No evidence of hardware or osseous fracture. Expected retropharyngeal soft tissue gas. No significant retropharyngeal swelling.  IMPRESSION: New C5-6 ACDF with plate.  No acute findings.   Electronically Signed   By: Tiburcio PeaJonathan  Watts [name].   On: 07/06/2014 21:12   Dg Cervical Spine 2-3 Views  07/06/2014   CLINICAL DATA:  Post ACDF C5-C6 level  EXAM: DG C-ARM 61-120 MIN; CERVICAL SPINE - 2-3 VIEW  TECHNIQUE: Three-view cervical spine submitted  CONTRAST:  None  FLUOROSCOPY TIME:  24 seconds  COMPARISON:  Three-view cervical spine submitted. Endotracheal tube in place. The patient is status post anterior metallic fusion C5-C6 level. Postsurgical this material noted at C5-C6 level. There is  anatomic alignment.  FINDINGS: Anterior metallic fusion cervical spine C5-C6 level with anatomic alignment.   Electronically Signed   By: Natasha MeadLiviu  Pop [name].   On: 07/06/2014 12:21  Dg C-arm 1-60 Min  07/06/2014   CLINICAL DATA:  Post ACDF C5-C6 level  EXAM: DG C-ARM 61-120 MIN; CERVICAL SPINE - 2-3 VIEW  TECHNIQUE: Three-view cervical spine submitted  CONTRAST:  None  FLUOROSCOPY TIME:  24 seconds  COMPARISON:  Three-view cervical spine submitted. Endotracheal tube in place. The patient is status post anterior metallic fusion C5-C6 level. Postsurgical this material noted at C5-C6 level. There is anatomic alignment.  FINDINGS: Anterior metallic fusion cervical spine C5-C6 level with anatomic alignment.   Electronically Signed   By: Natasha MeadLiviu  Pop [name].   On: 07/06/2014 12:21        Discharge Plan:  discharge to home   Disposition: stable.  Continue current pain regimen.  Will ween off dilaudid as an outpatient.      Signed: Venita LickBROOKS,Genoveva Singleton D for Dr. Venita Lickahari Ritamarie Arkin [facility] [phone] 07/07/2014, 7:58 AM

## 2014-07-07 NOTE — Progress Notes (Signed)
    Subjective: Procedure(s) (LRB): ANTERIOR CERVICAL DECOMPRESSION/DISCECTOMY FUSION 1 LEVEL (N/A) 1 Day Post-Op  Patient reports pain as 1 on 0-10 scale.  Reports decreased arm pain reports incisional neck pain   Positive void Negative bowel movement Positive flatus Negative chest pain or shortness of breath  Objective: Vital signs in last 24 hours: Temp:  [97.3 F (36.3 C)-98.1 F (36.7 C)] 97.7 F (36.5 C) (12/18 0531) Pulse Rate:  [71-101] 91 (12/18 0531) Resp:  [10-22] 16 (12/18 0531) BP: (113-157)/(50-74) 137/59 mmHg (12/18 0531) SpO2:  [91 %-100 %] 92 % (12/18 0531) Weight:  [54.432 kg (120 lb)] 54.432 kg (120 lb) (12/17 0840)  Intake/Output from previous day: 12/17 0701 - 12/18 0700 In: 1520 [P.O.:120; I.V.:1400] Out: 600 [Urine:600]  Labs:  Recent Labs  07/04/14 1027  WBC 7.1  RBC 4.86  HCT 43.7  PLT 191    Recent Labs  07/04/14 1027  NA 139  K 4.2  CL 100  CO2 27  BUN 10  CREATININE 0.79  GLUCOSE 104*  CALCIUM 9.1   No results for input(s): LABPT, INR in the last 72 hours.  Physical Exam: Neurologically intact ABD soft Intact pulses distally Incision: dressing C/D/I Compartment soft  Assessment/Plan: Patient stable  xrays satisfactory Mobilization with physical therapy Encourage incentive spirometry Continue care  Advance diet Up with therapy  Doing well ok for d/c to home today  Venita Lickahari Emera Bussie, MD [facility] [phone]

## 2014-07-07 NOTE — Progress Notes (Signed)
Physical Therapy Treatment Patient Details Name: Elizabeth Fowler Kina MRN: [id number] DOB: 29-Sep-1950 Today's Date: 07/07/2014    History of Present Illness Elizabeth Fowler. Myricks is a 63 y.o. Female s/p ACDF 1 Level on 07/06/14. Pt reports back surgery in Feb 2015.     PT Comments    Pt was able to show safety and proficiency with gait and ascending and descending the stairs.  She needed reinforcement of neck precautions and functional examples of when she might need to adjust how she moves.  Pt is ready for d/c.    Follow Up Recommendations  No PT follow up;Supervision/Assistance - 24 hour     Equipment Recommendations  None recommended by PT    Recommendations for Other Services   NA     Precautions / Restrictions Precautions Precautions: Cervical Precaution Comments: Pt unable to report cervical precautions back to me.  Reviewed all precautions with her including log roll, no BAT, no lifting >5#, and functional examples of each Required Braces or Orthoses: Cervical Brace Cervical Brace: Soft collar    Mobility     Transfers Overall transfer level: Needs assistance Equipment used: None Transfers: Sit to/from Stand Sit to Stand: Modified independent (Device/Increase time)            Ambulation/Gait Ambulation/Gait assistance: Modified independent (Device/Increase time) Ambulation Distance (Feet): 510 Feet Assistive device: None Gait Pattern/deviations: Staggering left;Staggering right Gait velocity: WNL Gait velocity interpretation: at or above normal speed for age/gender General Gait Details: Pt started with mildly staggering gait pattern that did not require external assist for recovery and the further she walked the more steady she got.     Stairs Stairs: Yes Stairs assistance: Min guard Stair Management: Two rails;One rail Right (two rails first, then one rail and hand held assist) Number of Stairs: 5 (x2 ) General stair comments: pt able to ascend and descend  stairs simulating home entry (one rail and husband's hand held assist) without difficulty.          Balance Overall balance assessment: Needs assistance Sitting-balance support: Feet supported;No upper extremity supported Sitting balance-Leahy Scale: Good     Standing balance support: No upper extremity supported Standing balance-Leahy Scale: Good                      Cognition Arousal/Alertness: Awake/alert Behavior During Therapy: WFL for tasks assessed/performed Overall Cognitive Status: Within Functional Limits for tasks assessed                             Pertinent Vitals/Pain Pain Assessment: No/denies pain           PT Goals (current goals can now be found in the care plan section) Acute Rehab PT Goals Patient Stated Goal: to return to independence Progress towards PT goals: Progressing toward goals    Frequency  Min 5X/week    PT Plan Current plan remains appropriate       End of Session   Activity Tolerance: Patient tolerated treatment well Patient left: Other (comment) (walking out to car with husband)     Time: [id number] PT Time Calculation (min) (ACUTE ONLY): 6 min  Charges:  $Gait Training:  (no charge due to session was < 8 mins)                    G Codes:  Functional Assessment Tool Used: Clinical judgment Functional Limitation: Mobility: Walking and moving around Mobility:  Walking and Moving Around Goal Status [phone](G8979): At least 1 percent but less than 20 percent impaired, limited or restricted Mobility: Walking and Moving Around Discharge Status [phone](G8980): 0 percent impaired, limited or restricted   Ranbir Chew B. Tensley Wery, PT, DPT [phone]   07/07/2014, 12:04 PM

## 2014-07-07 NOTE — Progress Notes (Deleted)
Patient ready for discharge. Spouse will provide transportation.

## 2014-07-07 NOTE — Progress Notes (Signed)
Patient ready for discharge. Spouse will provide transportation. 

## 2014-07-07 NOTE — Op Note (Signed)
NAMReece Fowler:  Elizabeth Fowler, Elizabeth Fowler                 ACCOUNT NO.:  [id number]  MEDICAL RECORD NO.:  [id number]  LOCATION:  [id number]                        FACILITY:  [facility]  PHYSICIAN:  Alvy Bealahari D Nimesh Riolo, MD    DATE OF BIRTH:  12-23-1950  DATE OF PROCEDURE:  07/06/2014 DATE OF DISCHARGE:                              OPERATIVE REPORT   PREOPERATIVE DIAGNOSIS:  Cervical spondylotic radicular arm pain, left side.  POSTOPERATIVE DIAGNOSIS:  Cervical spondylotic radicular arm pain, left side.  OPERATIVE PROCEDURE:  Anterior cervical diskectomy and fusion C5-6.  COMPLICATIONS:  None.  CONDITION:  Stable.  HISTORY:  This is a very pleasant 63 year old woman, who has been under my care for sometime.  The patient has been having significant neck and left radicular arm pain.  All appropriate conservative management was undertaken including shoulder therapy, shoulder PT, neck PT and neck injections and observation, despite all of this she continued to deteriorate.  The patient had some relief with a C6 selective nerve root block, and so with the degenerative changes and the success with the block, we decided to proceed with surgery.  All appropriate risks, benefits, and alternatives were discussed with the patient and consent was obtained.  OPERATIVE NOTE:  The patient was brought to the operating room, placed supine on the operating table.  After successful induction of general anesthesia and endotracheal intubation, TEDs, SCDs were applied.  She was placed on the operating room table.  The arms were secured at the side.  Bump was placed underneath the shoulder blade and the anterior cervical spine was prepped and draped in a standard fashion.  Time-out was taken confirming patient, procedure, and all other pertinent important data.  Once this was completed, a transverse incision that was centered over the C5-6 disk space was made and sharp dissection was carried out down to the platysma.  The platysma was  sharply incised and I continued my standard Scallan-Robinson approach to the anterior cervical spine.  Sharply dissecting along the medial border into the deep cervical fascia, I eventually palpated and protected the carotid sheath with a finger.  I continued dissecting through the prevertebral fascia until I could palpate the anterior cervical spine.  Hand held retractors was placed to retract the esophagus and I continued removing the prevertebral fascia to completely expose the anterior cervical spine.  A needle was placed into the C5-6 disk space and a x-ray was taken.  Once I confirmed that I was at the appropriate level, I then mobilized the longus colli muscles using bipolar electrocautery from the midbody of C5 to the midbody of C6.  Self-retaining retractor blades were placed underneath the longus colli muscle.  The endotracheal cuff was deflated and the retracting blades were expanded to the appropriate width.  The endotracheal cuff was then reexpanded.  Annulotomy was performed using a 15 blade scalpel.  Then using a combination of pituitary rongeurs, I removed the bulk of the disk material.  I then removed the overhanging osteophytes from the inferior aspect of the C5 body using a 2 and 3 mm Kerrison punch.  I placed distraction pins into the body of C5 and C6, distracted the intervertebral space and maintained  the distraction with the retractor.  I then continued to work posteriorly, removing the remainder of the posterior annular.  I then used a fine nerve hook to develop a plane underneath the annulus and posterior longitudinal ligament.  I then used my 1 mm Kerrison to resect the PLL as well as trim down the posterior osteophyte from the vertebral bodies of C5-6. At this point, the thecal sac was visualized.  I then went underneath the uncovertebral joints bilaterally and undercut this for an added decompression.  Once this was all completed, I irrigated the wound copiously  with normal saline and rasped the endplates to ensure I had bleeding subchondral bone.  I then broached the endplates with a curette and then measured the intervertebral space with trials.  I then took the 8 medium Titan titanium cage, packed with DBX mix and malleted it to the appropriate depth.  I then removed the distraction pins, sealed the hole sites and then placed a 14 mm anterior DePuy Skyline plate and affixed it with 14 mm self-drilling screws.  All screws were 2 finger tight and had good purchase.  I then locked the CAM locking system per manufacture's standards.  The retracting blades were removed and hemostasis was obtained using bipolar electrocautery.  The wound was copiously irrigated and then the esophagus and trachea returned to midline and the platysma was closed with interrupted 2-0 Vicryl sutures.  A 3-0 Monocryl was used to reapproximate the skin edges.  Steri-Strips and dry dressing were applied.  A hard collar was applied and the patient was extubated, transferred to the PACU without incident.  At the end of the case, all needle and sponge counts were correct.  There was no adverse intraoperative events.     Alvy Bealahari D Stiven Kaspar, MD     DDB/MEDQ  D:  07/06/2014  T:  07/07/2014  Job:  [id number]

## 2014-07-21 DIAGNOSIS — B029 Zoster without complications: Secondary | ICD-10-CM

## 2014-07-21 HISTORY — DX: Zoster without complications: B02.9

## 2014-07-24 NOTE — Anesthesia Postprocedure Evaluation (Signed)
  Anesthesia Post-op Note  Patient: Elizabeth Fowler  Procedure(s) Performed: Procedure(s): ANTERIOR CERVICAL DECOMPRESSION/DISCECTOMY FUSION 1 LEVEL (N/A)  Patient Location: PACU  Anesthesia Type:General  Level of Consciousness: awake and alert   Airway and Oxygen Therapy: Patient Spontanous Breathing  Post-op Pain: mild  Post-op Assessment: Post-op Vital signs reviewed  Post-op Vital Signs: stable  Last Vitals:  Filed Vitals:   07/07/14 0531  BP: 137/59  Pulse: 91  Temp: 36.5 C  Resp: 16    Complications: No apparent anesthesia complications

## 2014-08-26 ENCOUNTER — Encounter (HOSPITAL_COMMUNITY): Payer: Self-pay | Admitting: Emergency Medicine

## 2014-08-26 ENCOUNTER — Emergency Department (HOSPITAL_COMMUNITY): Payer: Medicaid Other

## 2014-08-26 ENCOUNTER — Emergency Department (HOSPITAL_COMMUNITY)
Admission: EM | Admit: 2014-08-26 | Discharge: 2014-08-26 | Disposition: A | Discharge status: Home / Self Care | Payer: Medicaid Other | Attending: Emergency Medicine | Admitting: Emergency Medicine

## 2014-08-26 DIAGNOSIS — R011 Cardiac murmur, unspecified: Secondary | ICD-10-CM | POA: Diagnosis not present

## 2014-08-26 DIAGNOSIS — Z8701 Personal history of pneumonia (recurrent): Secondary | ICD-10-CM | POA: Diagnosis not present

## 2014-08-26 DIAGNOSIS — Z880 Allergy status to penicillin: Secondary | ICD-10-CM | POA: Diagnosis not present

## 2014-08-26 DIAGNOSIS — S93412A Sprain of calcaneofibular ligament of left ankle, initial encounter: Secondary | ICD-10-CM | POA: Insufficient documentation

## 2014-08-26 DIAGNOSIS — S99912A Unspecified injury of left ankle, initial encounter: Secondary | ICD-10-CM | POA: Diagnosis present

## 2014-08-26 DIAGNOSIS — S93402A Sprain of unspecified ligament of left ankle, initial encounter: Secondary | ICD-10-CM

## 2014-08-26 DIAGNOSIS — Z79899 Other long term (current) drug therapy: Secondary | ICD-10-CM | POA: Diagnosis not present

## 2014-08-26 DIAGNOSIS — Z8619 Personal history of other infectious and parasitic diseases: Secondary | ICD-10-CM | POA: Diagnosis not present

## 2014-08-26 DIAGNOSIS — F329 Major depressive disorder, single episode, unspecified: Secondary | ICD-10-CM | POA: Insufficient documentation

## 2014-08-26 DIAGNOSIS — Y9289 Other specified places as the place of occurrence of the external cause: Secondary | ICD-10-CM | POA: Diagnosis not present

## 2014-08-26 DIAGNOSIS — G43909 Migraine, unspecified, not intractable, without status migrainosus: Secondary | ICD-10-CM | POA: Diagnosis not present

## 2014-08-26 DIAGNOSIS — Y9301 Activity, walking, marching and hiking: Secondary | ICD-10-CM | POA: Diagnosis not present

## 2014-08-26 DIAGNOSIS — F419 Anxiety disorder, unspecified: Secondary | ICD-10-CM | POA: Diagnosis not present

## 2014-08-26 DIAGNOSIS — G8929 Other chronic pain: Secondary | ICD-10-CM | POA: Insufficient documentation

## 2014-08-26 DIAGNOSIS — J449 Chronic obstructive pulmonary disease, unspecified: Secondary | ICD-10-CM | POA: Insufficient documentation

## 2014-08-26 DIAGNOSIS — Y998 Other external cause status: Secondary | ICD-10-CM | POA: Diagnosis not present

## 2014-08-26 DIAGNOSIS — Z720 Tobacco use: Secondary | ICD-10-CM | POA: Insufficient documentation

## 2014-08-26 DIAGNOSIS — X58XXXA Exposure to other specified factors, initial encounter: Secondary | ICD-10-CM | POA: Insufficient documentation

## 2014-08-26 NOTE — ED Notes (Signed)
PT stated she was walking and her left ankle rolled and having pain and swelling to left lower extremity.

## 2014-08-26 NOTE — Discharge Instructions (Signed)
Follow up with Dr. Shon BatonBrooks, return here as needed.   Ankle Sprain An ankle sprain is an injury to the strong, fibrous tissues (ligaments) that hold the bones of your ankle joint together.  CAUSES An ankle sprain is usually caused by a fall or by twisting your ankle. Ankle sprains most commonly occur when you step on the outer edge of your foot, and your ankle turns inward. People who participate in sports are more prone to these types of injuries.  SYMPTOMS   Pain in your ankle. The pain may be present at rest or only when you are trying to stand or walk.  Swelling.  Bruising. Bruising may develop immediately or within 1 to 2 days after your injury.  Difficulty standing or walking, particularly when turning corners or changing directions. DIAGNOSIS  Your caregiver will ask you details about your injury and perform a physical exam of your ankle to determine if you have an ankle sprain. During the physical exam, your caregiver will press on and apply pressure to specific areas of your foot and ankle. Your caregiver will try to move your ankle in certain ways. An X-ray exam may be done to be sure a bone was not broken or a ligament did not separate from one of the bones in your ankle (avulsion fracture).  TREATMENT  Certain types of braces can help stabilize your ankle. Your caregiver can make a recommendation for this. Your caregiver may recommend the use of medicine for pain. If your sprain is severe, your caregiver may refer you to a surgeon who helps to restore function to parts of your skeletal system (orthopedist) or a physical therapist. HOME CARE INSTRUCTIONS   Apply ice to your injury for 1-2 days or as directed by your caregiver. Applying ice helps to reduce inflammation and pain.  Put ice in a plastic bag.  Place a towel between your skin and the bag.  Leave the ice on for 15-20 minutes at a time, every 2 hours while you are awake.  Only take over-the-counter or prescription  medicines for pain, discomfort, or fever as directed by your caregiver.  Elevate your injured ankle above the level of your heart as much as possible for 2-3 days.  If your caregiver recommends crutches, use them as instructed. Gradually put weight on the affected ankle. Continue to use crutches or a cane until you can walk without feeling pain in your ankle.  If you have a plaster splint, wear the splint as directed by your caregiver. Do not rest it on anything harder than a pillow for the first 24 hours. Do not put weight on it. Do not get it wet. You may take it off to take a shower or bath.  You may have been given an elastic bandage to wear around your ankle to provide support. If the elastic bandage is too tight (you have numbness or tingling in your foot or your foot becomes cold and blue), adjust the bandage to make it comfortable.  If you have an air splint, you may blow more air into it or let air out to make it more comfortable. You may take your splint off at night and before taking a shower or bath. Wiggle your toes in the splint several times per day to decrease swelling. SEEK MEDICAL CARE IF:   You have rapidly increasing bruising or swelling.  Your toes feel extremely cold or you lose feeling in your foot.  Your pain is not relieved with medicine. SEEK  IMMEDIATE MEDICAL CARE IF:  Your toes are numb or blue.  You have severe pain that is increasing. MAKE SURE YOU:   Understand these instructions.  Will watch your condition.  Will get help right away if you are not doing well or get worse. Document Released: 07/07/2005 Document Revised: 03/31/2012 Document Reviewed: 07/19/2011 [facility] [facility] Patient Information 2015 [place], [place]. This information is not intended to replace advice given to you by your health care provider. Make sure you discuss any questions you have with your health care provider.

## 2014-08-26 NOTE — ED Provider Notes (Signed)
CSN: [id number]     Arrival date & time 08/26/14  1653 History   First MD Initiated Contact with Patient 08/26/14 1717     Chief Complaint  Patient presents with  . Ankle Injury     (Consider location/radiation/quality/duration/timing/severity/associated sxs/prior Treatment) Patient is a 64 y.o. female presenting with lower extremity injury. The history is provided by the patient.  Ankle Injury This is a new problem. The current episode started today. The problem occurs constantly. The problem has been gradually worsening. The symptoms are aggravated by standing and walking.   Elizabeth Fowler is a 64 y.o. female who presents to the ED with left ankle pain and swelling. She states that she was walking and rolled her left ankle. The injury occurred approximately 2 hours prior to arrival to the ED. She took dilaudid she had at home for pain and it has helped some.  Past Medical History  Diagnosis Date  . Anxiety   . Depression   . Heart murmur   . MVP (mitral valve prolapse)     mild anterior leafleat MVP with mild MR 02/26/04 echo  . Pneumonia     "I've had pneumonia probably 30 times" (08/31/2013)  . Chronic bronchitis     "got it q yr til pneumonia shot given" (08/31/2013)  . Migraines     "goes to bed dark room; last migraine maybe 5 yrs ago" (08/31/2013)  . Arthritis     "right hip" (08/31/2013)  . Chronic lower back pain   . COPD (chronic obstructive pulmonary disease)   . Shingles     3-4 MTHS AGO   Past Surgical History  Procedure Laterality Date  . Kidney surgery  05/1976    S/P hysterectomy; cut ureter; had to repair kidney/bladder; thought they removed my kidney for years; records were destroyed; developed problems; told insides looked like mush; cleaned out the infection and repaired leaking bladder I think" (08/31/2013)  . Anterior lumbar fusion  08/31/2013  . Cholecystectomy  1970's  . Appendectomy  1970's  . Abdominal hysterectomy  05/1976  . Dilation and curettage of  uterus  12/1969  . Anterior lumbar fusion N/A 08/31/2013    Procedure: ALIF L5 - S1 1 LEVEL;  Surgeon: Venita Lick, MD;  Location: [facility] OR;  Service: Orthopedics;  Laterality: N/A;  . Abdominal exposure N/A 08/31/2013    Procedure: ABDOMINAL EXPOSURE;  Surgeon: Larina Earthly, MD;  Location: [facility] OR;  Service: Vascular;  Laterality: N/A;  . Anterior cervical decomp/discectomy fusion N/A 07/06/2014    Procedure: ANTERIOR CERVICAL DECOMPRESSION/DISCECTOMY FUSION 1 LEVEL;  Surgeon: Venita Lick, MD;  Location: [facility] OR;  Service: Orthopedics;  Laterality: N/A;   Family History  Problem Relation Age of Onset  . Heart disease Mother   . Hypertension Mother    History  Substance Use Topics  . Smoking status: Current Every Day Smoker -- 0.25 packs/day for 50 years    Types: Cigarettes  . Smokeless tobacco: Never Used  . Alcohol Use: No     Comment: 08/31/2013 "drank a little years ago; last drink was probably in the 1990's"   OB History    No data available     Review of Systems Negative except as stated in HPI   Allergies  Codeine and Penicillins  Home Medications   Prior to Admission medications   Medication Sig Start Date End Date Taking? Authorizing Provider  albuterol (PROVENTIL HFA;VENTOLIN HFA) 108 (90 BASE) MCG/ACT inhaler Inhale 2 puffs into the lungs every  6 (six) hours as needed for wheezing or shortness of breath.   Yes Historical Provider, MD  ALPRAZolam Prudy Feeler(XANAX) 1 MG tablet Take 1 mg by mouth 4 (four) times daily as needed for anxiety.    Yes Historical Provider, MD  buPROPion (WELLBUTRIN SR) 150 MG 12 hr tablet Take 150 mg by mouth 2 (two) times daily. Once daily for the first 7 days then twice daily   Yes Historical Provider, MD  diazepam (VALIUM) 10 MG tablet Take 10 mg by mouth every 6 (six) hours as needed for anxiety.   Yes Historical Provider, MD  gabapentin (NEURONTIN) 300 MG capsule Take 300 mg by mouth 3 (three) times daily. 300mg  in the AM and 600mg  in the PM   Yes  Historical Provider, MD  HYDROmorphone (DILAUDID) 4 MG tablet Take 6 mg by mouth every 4 (four) hours as needed for severe pain.   Yes Historical Provider, MD  methocarbamol (ROBAXIN) 500 MG tablet Take 1 tablet (500 mg total) by mouth 3 (three) times daily. 07/07/14  Yes Venita Lickahari [name], MD  ondansetron (ZOFRAN) 4 MG tablet Take 1 tablet (4 mg total) by mouth every 8 (eight) hours as needed for nausea or vomiting. Patient not taking: Reported on 08/26/2014 07/07/14   Venita Lickahari [name], MD   BP 124/65 mmHg  Pulse 76  Temp(Src) 97.7 F (36.5 C) (Oral)  Resp 18  Ht 5' 1.5" (1.562 m)  Wt 120 lb (54.432 kg)  BMI 22.31 kg/m2  SpO2 98% Physical Exam  Constitutional: She is oriented to person, place, and time. She appears well-developed and well-nourished. No distress.  HENT:  Head: Normocephalic and atraumatic.  Eyes: EOM are normal.  Neck: Neck supple.  Cardiovascular: Normal rate.   Pulmonary/Chest: Effort normal.  Musculoskeletal:       Left ankle: She exhibits decreased range of motion (due to pain) and swelling. She exhibits no laceration and normal pulse. Tenderness. Lateral malleolus tenderness found. Achilles tendon normal.       Feet:  Pedal pulses 2+, adequate circulation, good touch sensation.   Neurological: She is alert and oriented to person, place, and time. No cranial nerve deficit.  Skin: Skin is warm and dry.  Psychiatric: She has a normal mood and affect. Her behavior is normal.  Nursing note and vitals reviewed.   ED Course  Procedures ( Dg Ankle Complete Left  08/26/2014   CLINICAL DATA:  Rolled left ankle, swelling  EXAM: LEFT ANKLE COMPLETE - 3+ VIEW  COMPARISON:  None.  FINDINGS: No fracture or dislocation is seen.  The ankle mortise is intact.  The base of the fifth metatarsal is unremarkable.  Mild diffuse soft tissue swelling, most prominent laterally.  IMPRESSION: No fracture or dislocation is seen.  Mild soft tissue swelling.   Electronically Signed   By: Charline BillsSriyesh   Krishnan [name].   On: 08/26/2014 18:01    Dr. Effie ShyWentz in to examine the patient.  MDM  64 y.o. female with left ankle pain and swelling s/p fall tonight. Splint applied, ice, elevation and pain management. Stable for d/c and remains neurovascularly intact.  Final diagnoses:  Ankle sprain, left, initial encounter      [facility] Adelis Docter, NP 08/27/14 0023  Flint MelterElliott L Wentz, MD 08/27/14 [phone]

## 2014-08-26 NOTE — ED Notes (Signed)
Patient with no complaints at this time. Respirations even and unlabored. Skin warm/dry. Discharge instructions reviewed with patient at this time. Patient given opportunity to voice concerns/ask questions. Patient discharged at this time and left Emergency Department with steady gait.   

## 2014-09-20 ENCOUNTER — Ambulatory Visit (HOSPITAL_COMMUNITY): Payer: Medicaid Other | Attending: Orthopedic Surgery | Admitting: Physical Therapy

## 2014-09-20 DIAGNOSIS — M545 Low back pain: Secondary | ICD-10-CM | POA: Diagnosis not present

## 2014-09-20 DIAGNOSIS — M4326 Fusion of spine, lumbar region: Secondary | ICD-10-CM

## 2014-09-20 DIAGNOSIS — M25659 Stiffness of unspecified hip, not elsewhere classified: Secondary | ICD-10-CM

## 2014-09-20 DIAGNOSIS — R293 Abnormal posture: Secondary | ICD-10-CM | POA: Insufficient documentation

## 2014-09-20 DIAGNOSIS — M25652 Stiffness of left hip, not elsewhere classified: Secondary | ICD-10-CM | POA: Diagnosis not present

## 2014-09-20 DIAGNOSIS — M25651 Stiffness of right hip, not elsewhere classified: Secondary | ICD-10-CM | POA: Diagnosis not present

## 2014-09-20 DIAGNOSIS — M5382 Other specified dorsopathies, cervical region: Secondary | ICD-10-CM | POA: Insufficient documentation

## 2014-09-20 DIAGNOSIS — M436 Torticollis: Secondary | ICD-10-CM

## 2014-09-20 DIAGNOSIS — R262 Difficulty in walking, not elsewhere classified: Secondary | ICD-10-CM | POA: Insufficient documentation

## 2014-09-20 DIAGNOSIS — Z981 Arthrodesis status: Secondary | ICD-10-CM | POA: Diagnosis not present

## 2014-09-20 DIAGNOSIS — R6889 Other general symptoms and signs: Secondary | ICD-10-CM

## 2014-09-20 DIAGNOSIS — R52 Pain, unspecified: Secondary | ICD-10-CM

## 2014-09-20 NOTE — Patient Instructions (Signed)
Piriformis (Supine)   Cross legs, right on top. Gently pull other knee toward chest until stretch is felt in buttock/hip of top leg. Hold 20seconds. Repeat 2 times per set. Do 2 sessions per day.  http://orth.exer.us/677   Copyright  VHI. All rights reserved.

## 2014-09-20 NOTE — Therapy (Addendum)
[place], [place], [address] Phone: [phone]   Fax:  [phone]  Physical Therapy Evaluation  Patient Details  Name: Elizabeth Fowler MRN: [id number] Date of Birth: 05-21-1951 Referring Provider:  Melina Schools, MD  Encounter Date: 09/20/2014      PT End of Session - 09/20/14 1400    Visit Number 1   Number of Visits 16   Date for PT Re-Evaluation 10/20/14   Authorization Type Medicaid   Authorization Time Period 1, authorization pending   Authorization - Visit Number 1   PT Start Time 1300   PT Stop Time 1400   PT Time Calculation (min) 60 min   Activity Tolerance Patient tolerated treatment well   Behavior During Therapy [facility] for tasks assessed/performed      Past Medical History  Diagnosis Date  . Anxiety   . Depression   . Heart murmur   . MVP (mitral valve prolapse)     mild anterior leafleat MVP with mild MR 02/26/04 echo  . Pneumonia     "I've had pneumonia probably 30 times" (08/31/2013)  . Chronic bronchitis     "got it q yr til pneumonia shot given" (08/31/2013)  . Migraines     "goes to bed dark room; last migraine maybe 5 yrs ago" (08/31/2013)  . Arthritis     "right hip" (08/31/2013)  . Chronic lower back pain   . COPD (chronic obstructive pulmonary disease)   . Shingles     3-4 MTHS AGO    Past Surgical History  Procedure Laterality Date  . Kidney surgery  05/1976    S/P hysterectomy; cut ureter; had to repair kidney/bladder; thought they removed my kidney for years; records were destroyed; developed problems; told insides looked like mush; cleaned out the infection and repaired leaking bladder I think" (08/31/2013)  . Anterior lumbar fusion  08/31/2013  . Cholecystectomy  1970's  . Appendectomy  1970's  . Abdominal hysterectomy  05/1976  . Dilation and curettage of uterus  12/1969  . Anterior lumbar fusion N/A 08/31/2013    Procedure: ALIF L5 - S1 1 LEVEL;  Surgeon: Melina Schools,  MD;  Location: [place];  Service: Orthopedics;  Laterality: N/A;  . Abdominal exposure N/A 08/31/2013    Procedure: ABDOMINAL EXPOSURE;  Surgeon: Rosetta Posner, MD;  Location: [place];  Service: Vascular;  Laterality: N/A;  . Anterior cervical decomp/discectomy fusion N/A 07/06/2014    Procedure: ANTERIOR CERVICAL DECOMPRESSION/DISCECTOMY FUSION 1 LEVEL;  Surgeon: Melina Schools, MD;  Location: [place];  Service: Orthopedics;  Laterality: N/A;    There were no vitals taken for this visit.  Visit Diagnosis:  Fusion of lumbar spine - Plan: PT plan of care cert/re-cert  History of fusion of cervical spine - Plan: PT plan of care cert/re-cert  Stiffness of hip joint, unspecified laterality - Plan: PT plan of care cert/re-cert  Stiffness of cervical spine - Plan: PT plan of care cert/re-cert  Difficulty walking - Plan: PT plan of care cert/re-cert  Pain aggravated by sitting - Plan: PT plan of care cert/re-cert  Postural instability of trunk - Plan: PT plan of care cert/re-cert      Subjective Assessment - 09/20/14 1308    Symptoms primary complaint of Neck stiffness, low back pain from midline to laterally in hips.    Pertinent History lumbar spine fusion 08/31/14, and Cervical spine fusion in 09/06/14 with no prior therapy. Patient states that MD instructed her that  bending, lifting, stooping and squatting precautions are for life and that she can not perform them.   How long can you walk comfortably? <36mnutes start feeling weak and increased back pain.    Patient Stated Goals to be able to stand, sit and walk > 1 hoour with out having to change position due to pain and fatigue.    Currently in Pain? Yes   Pain Score 5    Pain Location Neck   Pain Orientation Mid;Posterior;Right   Pain Descriptors / Indicators Aching   Pain Type Surgical pain   Aggravating Factors  Turning head to Lt pain on Rt.  Pain in midline low back that radiates tohips and spasms and pain Rt calf   Pain Relieving  Factors Rest, changing position.           [facility] - 09/20/14 0001    Assessment   Medical Diagnosis L5-S1 Fusion and C5-6 fusion/discectomy   Onset Date 07/06/14   Next MD Visit DBritt Bottom  Prior Therapy no   Balance Screen   Has the patient fallen in the past 6 months No   Has the patient had a decrease in activity level because of a fear of falling?  Yes   Is the patient reluctant to leave their home because of a fear of falling?  Yes   Prior Function   Level of Independence Independent with basic ADLs   Functional Tests   Functional tests Other;Other2   Other:   Other/ Comments Gait: forward trunk lean, limited thoracic spine rotation, limited stride length, limited pelvic rotation, Limited Lt LE external rotation comparted to Rt, Limited ability to toe in bilaterally.    Other:   Other/Comments 3D hip excursions: limited Frontal plane to Lt and lomited transverse plane blaterally.    AROM   Overall AROM Comments Lumbar ROM not measured due to precautions.    AROM Assessment Site Shoulder   Right Shoulder Flexion 95 Degrees   Right Shoulder ABduction 95 Degrees   Right Shoulder Horizontal ABduction 10 Degrees   Left Shoulder Flexion 95 Degrees   Left Shoulder ABduction 80 Degrees   Left Shoulder Horizontal ABduction 10 Degrees   Right Hip Extension 0   Right Hip External Rotation  46   Right Hip Internal Rotation  15   Left Hip Extension 0   Left Hip External Rotation  45   Left Hip Internal Rotation  16   Strength   Strength Assessment Site Lumbar   Lumbar Flexion 2+/5   Lumbar Extension 2+/5                            PT Short Term Goals - 09/20/14 1546    PT SHORT TERM GOAL #1   Title Patient will dmoenstrate increased shoulder flexion and abduction bilaterally to >110 degrees to more easily scratch head.    Time 4   Period Weeks   Status New   PT SHORT TERM GOAL #2   Title Patient will dmeonstrate increased shoulder  horizontal abduction bilaterally to 15 degrees to more easuily reach to behind car seat.    Time 4   Period Weeks   Status New   PT SHORT TERM GOAL #3   Title Patient will dmeonstrate increased hip internal rotaon bialterally to 25 degrees to improve deceleration mechanics during gait.    Time 4   Period Weeks   Status New   PT SHORT  TERM GOAL #4   Title Patient will demonstrate increased hip extension to 10 degrees to increase stride length during gait.    Time 4   Period Weeks   Status New   PT SHORT TERM GOAL #5   Title Patient will demonstrate increased abdominal strength for increased trunk stability of MMT of 3/5 MMT   Time 4   Period Weeks   Status New           PT Long Term Goals - 09/20/14 1549    PT LONG TERM GOAL #1   Title Patient will be indpeendent with HEP.    Time 8   Period Weeks   Status New   PT LONG TERM GOAL #2   Title Patient will dmeonstrate increased hip internal rotation bialterally to 35 degrees to improve deceleration mechanics during gait.    Time 8   Period Weeks   Status New   PT LONG TERM GOAL #3   Title Patient will demonstrate increased abdominal strength for increased trunk stability of MMT of 4/5 MMT   Time 8   Period Weeks   Status New   PT LONG TERM GOAL #4   Title Patient will be able to ambulate for 2 hours with out stopping due to fatigue   Time 8   Period Weeks   Status New               Plan - 09/20/14 1401    Clinical Impression Statement Patient dispalsy low back and neck pain s/p lumbar spine fusion L5-S1 and Cervical spine fusion C5-C6 resulting in significant mobility limitations in lumbar and cervical spine pain. Patient displays multiple contributing factors resulting in limited gait efficiency includign limited hip and thoracic spine mobility as well as pain with sitting attributed to trunk weakness resultign in decreased trunk stability. Patient will benefit from skilled physical therapy to iincrease  hip and  thoracic spine mobility to ioncrease gait efficency and increase LE and trunk strength to increase trunk stability and improve ability to perform prolonged sitting and standing with  decreased pain.    Pt will benefit from skilled therapeutic intervention in order to improve on the following deficits Abnormal gait;Decreased endurance;Improper body mechanics;Impaired flexibility;Decreased strength;Difficulty walking;Pain;Decreased balance;Decreased range of motion;Increased fascial restricitons   Rehab Potential Fair   PT Frequency 2x / week   PT Duration 8 weeks   PT Treatment/Interventions Gait training;Stair training;Patient/family education;Functional mobility training;Passive range of motion;Therapeutic activities;Manual techniques;Therapeutic exercise   PT Next Visit Plan introduce abdominal and trunk stabilization exercises and hip strengthening exercises.    PT Home Exercise Plan LE stretches: hip flexor, hamstring piriformis, calf stretches   Consulted and Agree with Plan of Care Patient         Problem List Patient Active Problem List   Diagnosis Date Noted  . Neck pain 07/06/2014  . Back pain 08/31/2013  . Degeneration of intervertebral disc, site unspecified 08/16/2013  . Stiffness of joints, not elsewhere classified, multiple sites 03/16/2013  . Weakness of both legs 03/16/2013  . Difficulty in walking(719.7) 03/16/2013  . COPD 04/10/2009  . CONSTIPATION, CHRONIC 04/10/2009  . ARTHRITIS 04/10/2009  . BACK PAIN, LUMBOSACRAL, CHRONIC 04/10/2009  . WEIGHT LOSS, ABNORMAL 04/10/2009  . OTHER DYSPHAGIA 04/10/2009    Angeliki Mates R 09/20/2014, 3:52 PM  [place] [address] [place], [place], [address] Phone: [phone]   Fax:  [phone]   PHYSICAL THERAPY DISCHARGE SUMMARY  [facility] from [facility]  of Care: 1  Current functional level related to goals / functional outcomes: As above Remaining deficits: As above    Education / Equipment: HEP  Plan: Patient agrees to discharge.  Patient goals were not met. Patient is being discharged due to not returning since the last visit.  ?????       Rayetta Humphrey, [place] CLT [phone]

## 2014-09-29 ENCOUNTER — Ambulatory Visit (HOSPITAL_COMMUNITY): Payer: Medicaid Other | Admitting: Physical Therapy

## 2014-10-26 DIAGNOSIS — Z0279 Encounter for issue of other medical certificate: Secondary | ICD-10-CM

## 2015-03-22 ENCOUNTER — Emergency Department (HOSPITAL_COMMUNITY): Payer: Medicaid Other

## 2015-03-22 ENCOUNTER — Encounter (HOSPITAL_COMMUNITY): Payer: Self-pay

## 2015-03-22 ENCOUNTER — Emergency Department (HOSPITAL_COMMUNITY)
Admission: EM | Admit: 2015-03-22 | Discharge: 2015-03-22 | Disposition: A | Discharge status: Home / Self Care | Payer: Medicaid Other | Attending: Emergency Medicine | Admitting: Emergency Medicine

## 2015-03-22 DIAGNOSIS — Z8619 Personal history of other infectious and parasitic diseases: Secondary | ICD-10-CM | POA: Insufficient documentation

## 2015-03-22 DIAGNOSIS — Z8701 Personal history of pneumonia (recurrent): Secondary | ICD-10-CM | POA: Diagnosis not present

## 2015-03-22 DIAGNOSIS — Z720 Tobacco use: Secondary | ICD-10-CM | POA: Insufficient documentation

## 2015-03-22 DIAGNOSIS — F329 Major depressive disorder, single episode, unspecified: Secondary | ICD-10-CM | POA: Diagnosis not present

## 2015-03-22 DIAGNOSIS — Z880 Allergy status to penicillin: Secondary | ICD-10-CM | POA: Insufficient documentation

## 2015-03-22 DIAGNOSIS — G8929 Other chronic pain: Secondary | ICD-10-CM | POA: Insufficient documentation

## 2015-03-22 DIAGNOSIS — R079 Chest pain, unspecified: Secondary | ICD-10-CM | POA: Diagnosis present

## 2015-03-22 DIAGNOSIS — F419 Anxiety disorder, unspecified: Secondary | ICD-10-CM | POA: Insufficient documentation

## 2015-03-22 DIAGNOSIS — J449 Chronic obstructive pulmonary disease, unspecified: Secondary | ICD-10-CM | POA: Insufficient documentation

## 2015-03-22 DIAGNOSIS — Z79899 Other long term (current) drug therapy: Secondary | ICD-10-CM | POA: Diagnosis not present

## 2015-03-22 DIAGNOSIS — Z8679 Personal history of other diseases of the circulatory system: Secondary | ICD-10-CM | POA: Insufficient documentation

## 2015-03-22 DIAGNOSIS — R0789 Other chest pain: Secondary | ICD-10-CM | POA: Insufficient documentation

## 2015-03-22 DIAGNOSIS — G43909 Migraine, unspecified, not intractable, without status migrainosus: Secondary | ICD-10-CM | POA: Insufficient documentation

## 2015-03-22 DIAGNOSIS — Z8709 Personal history of other diseases of the respiratory system: Secondary | ICD-10-CM | POA: Diagnosis not present

## 2015-03-22 DIAGNOSIS — Z8739 Personal history of other diseases of the musculoskeletal system and connective tissue: Secondary | ICD-10-CM | POA: Diagnosis not present

## 2015-03-22 DIAGNOSIS — R011 Cardiac murmur, unspecified: Secondary | ICD-10-CM | POA: Diagnosis not present

## 2015-03-22 LAB — BASIC METABOLIC PANEL
Anion gap: 9 (ref 5–15)
BUN: 12 mg/dL (ref 6–20)
CO2: 24 mmol/L (ref 22–32)
Calcium: 8.7 mg/dL — ABNORMAL LOW (ref 8.9–10.3)
Chloride: 103 mmol/L (ref 101–111)
Creatinine, Ser: 0.57 mg/dL (ref 0.44–1.00)
GFR calc Af Amer: >60 mL/min (ref >60)
GFR calc non Af Amer: >60 mL/min (ref >60)
Glucose, Bld: 97 mg/dL (ref 65–99)
Potassium: 3.9 mmol/L (ref 3.5–5.1)
Sodium: 136 mmol/L (ref 135–145)

## 2015-03-22 LAB — TROPONIN I: Troponin I: <0.03 ng/mL (ref <0.031)

## 2015-03-22 LAB — CBC WITH DIFFERENTIAL/PLATELET
Basophils Absolute: 0 10*3/uL (ref 0.0–0.1)
Basophils Relative: 0 % (ref 0–1)
Eosinophils Absolute: 0.1 10*3/uL (ref 0.0–0.7)
Eosinophils Relative: 2 % (ref 0–5)
HCT: 44.8 % (ref 36.0–46.0)
Hemoglobin: 15.2 g/dL — ABNORMAL HIGH (ref 12.0–15.0)
Lymphocytes Relative: 32 % (ref 12–46)
Lymphs Abs: 2.8 10*3/uL (ref 0.7–4.0)
MCH: 31.1 pg (ref 26.0–34.0)
MCHC: 33.9 g/dL (ref 30.0–36.0)
MCV: 91.6 fL (ref 78.0–100.0)
Monocytes Absolute: 0.5 10*3/uL (ref 0.1–1.0)
Monocytes Relative: 6 % (ref 3–12)
Neutro Abs: 5.3 10*3/uL (ref 1.7–7.7)
Neutrophils Relative %: 60 % (ref 43–77)
Platelets: 199 10*3/uL (ref 150–400)
RBC: 4.89 MIL/uL (ref 3.87–5.11)
RDW: 13.8 % (ref 11.5–15.5)
WBC: 8.8 10*3/uL (ref 4.0–10.5)

## 2015-03-22 MED ORDER — GI COCKTAIL ~~LOC~~
30.0000 mL | Freq: Once | ORAL | Status: AC
  Administered 2015-03-22: 30 mL via ORAL
  Filled 2015-03-22: qty 30

## 2015-03-22 NOTE — ED Notes (Signed)
Pt complain of pain in center of chest that radiates to the right shoulder and neck. Pt took 3 of her husbands NTG with relief. Pt also took ASA 324 mg. Pt denies pain at present

## 2015-03-22 NOTE — ED Provider Notes (Signed)
CSN: [id number]     Arrival date & time 03/22/15  1318 History   First MD Initiated Contact with Patient 03/22/15 1337     Chief Complaint  Patient presents with  . Chest Pain     (Consider location/radiation/quality/duration/timing/severity/associated sxs/prior Treatment) HPI   ARIYANAH Fowler is a 64 y.o. female who presents for evaluation of chest pain. Chest pain started spontaneously about 2 hours ago. She was sitting at rest at the time. The pain started abruptly, and was 8/10, felt as both burning and sharp and radiating to her right neck. The pain began to resolve within 15 minutes. She took 3 sublingual nitroglycerin. Over the following 15 minutes, and the pain seemed to improve each time, but returned. Her husband called her EMS, who were at the house within a few minutes. During EMS transport, her pain resolved within a few minutes. She also took aspirin at home. She has never had this previously and has no prior history of cardiac disease. She is currently in process for having her teeth removed in preparation for dentures. She smokes cigarettes but is trying to quit. She has chronic pain, and uses daily narcotics. She does not know her cholesterol level. There are no other known modifying factors.  Past Medical History  Diagnosis Date  . Anxiety   . Depression   . Heart murmur   . MVP (mitral valve prolapse)     mild anterior leafleat MVP with mild MR 02/26/04 echo  . Pneumonia     "I've had pneumonia probably 30 times" (08/31/2013)  . Chronic bronchitis     "got it q yr til pneumonia shot given" (08/31/2013)  . Migraines     "goes to bed dark room; last migraine maybe 5 yrs ago" (08/31/2013)  . Arthritis     "right hip" (08/31/2013)  . Chronic lower back pain   . COPD (chronic obstructive pulmonary disease)   . Shingles     3-4 MTHS AGO   Past Surgical History  Procedure Laterality Date  . Kidney surgery  05/1976    S/P hysterectomy; cut ureter; had to repair  kidney/bladder; thought they removed my kidney for years; records were destroyed; developed problems; told insides looked like mush; cleaned out the infection and repaired leaking bladder I think" (08/31/2013)  . Anterior lumbar fusion  08/31/2013  . Cholecystectomy  1970's  . Appendectomy  1970's  . Abdominal hysterectomy  05/1976  . Dilation and curettage of uterus  12/1969  . Anterior lumbar fusion N/A 08/31/2013    Procedure: ALIF L5 - S1 1 LEVEL;  Surgeon: Venita Lick, MD;  Location: [facility] OR;  Service: Orthopedics;  Laterality: N/A;  . Abdominal exposure N/A 08/31/2013    Procedure: ABDOMINAL EXPOSURE;  Surgeon: Larina Earthly, MD;  Location[facility] OR;  Service: Vascular;  Laterality: N/A;  . Anterior cervical decomp/discectomy fusion N/A 07/06/2014    Procedure: ANTERIOR CERVICAL DECOMPRESSION/DISCECTOMY FUSION 1 LEVEL;  Surgeon: Venita Lick, MD;  Location: [facility] OR;  Service: Orthopedics;  Laterality: N/A;   Family History  Problem Relation Age of Onset  . Heart disease Mother   . Hypertension Mother    Social History  Substance Use Topics  . Smoking status: Current Every Day Smoker -- 0.25 packs/day for 50 years    Types: Cigarettes  . Smokeless tobacco: Never Used  . Alcohol Use: No     Comment: 08/31/2013 "drank a little years ago; last drink was probably in the 1990's"   OB History  No data available     Review of Systems  All other systems reviewed and are negative.     Allergies  Codeine and Penicillins  Home Medications   Prior to Admission medications   Medication Sig Start Date End Date Taking? Authorizing Provider  albuterol (PROVENTIL HFA;VENTOLIN HFA) 108 (90 BASE) MCG/ACT inhaler Inhale 2 puffs into the lungs every 6 (six) hours as needed for wheezing or shortness of breath.    Historical Provider, MD  ALPRAZolam Prudy Feeler) 1 MG tablet Take 1 mg by mouth 4 (four) times daily as needed for anxiety.     Historical Provider, MD  buPROPion (WELLBUTRIN SR) 150 MG 12 hr  tablet Take 150 mg by mouth 2 (two) times daily. Once daily for the first 7 days then twice daily    Historical Provider, MD  diazepam (VALIUM) 10 MG tablet Take 10 mg by mouth every 6 (six) hours as needed for anxiety.    Historical Provider, MD  gabapentin (NEURONTIN) 300 MG capsule Take 300 mg by mouth 3 (three) times daily. 300mg  in the AM and 600mg  in the PM    Historical Provider, MD  HYDROmorphone (DILAUDID) 4 MG tablet Take 6 mg by mouth every 4 (four) hours as needed for severe pain.    Historical Provider, MD  methocarbamol (ROBAXIN) 500 MG tablet Take 1 tablet (500 mg total) by mouth 3 (three) times daily. 07/07/14   Venita Lick, MD  ondansetron (ZOFRAN) 4 MG tablet Take 1 tablet (4 mg total) by mouth every 8 (eight) hours as needed for nausea or vomiting. 07/07/14   Venita Lick, MD   BP 109/60 mmHg  Pulse 66  Temp(Src) 98 F (36.7 C) (Oral)  Resp 18  Ht 5\' 1"  (1.549 m)  Wt 122 lb (55.339 kg)  BMI 23.06 kg/m2  SpO2 94% Physical Exam  Constitutional: She is oriented to person, place, and time. She appears well-developed and well-nourished.  HENT:  Head: Normocephalic and atraumatic.  Right Ear: External ear normal.  Left Ear: External ear normal.  Eyes: Conjunctivae and EOM are normal. Pupils are equal, round, and reactive to light.  Neck: Normal range of motion and phonation normal. Neck supple.  Cardiovascular: Normal rate, regular rhythm and normal heart sounds.   Pulmonary/Chest: Effort normal and breath sounds normal. She exhibits no bony tenderness.  Abdominal: Soft. There is no tenderness.  Musculoskeletal: Normal range of motion.  Neurological: She is alert and oriented to person, place, and time. No cranial nerve deficit or sensory deficit. She exhibits normal muscle tone. Coordination normal.  Skin: Skin is warm, dry and intact.  Psychiatric: She has a normal mood and affect. Her behavior is normal. Judgment and thought content normal.  Nursing note and vitals  reviewed.   ED Course  Procedures (including critical care time)  Medications  gi cocktail (Maalox,Lidocaine,Donnatal) (30 mLs Oral Given 03/22/15 1355)    Patient Vitals for the past 24 hrs:  BP Temp Temp src Pulse Resp SpO2 Height Weight  03/22/15 1415 - - - - - 91 % - -  03/22/15 1400 - - - 66 18 94 % - -  03/22/15 1330 109/60 mmHg 98 F (36.7 C) Oral 71 12 93 % - -  03/22/15 1325 - - - - - - 5\' 1"  (1.549 m) 122 lb (55.339 kg)    3:36 PM Reevaluation with update and discussion. After initial assessment and treatment, an updated evaluation reveals patient is unchanged at this time. No recurrence of her  chest pain. Findings discussed with patient and husband, all questions were answered. Lisseth Brazeau L   Heart score, by the Heart Pathway- 3- < 1 % risk of MACE within first 30 days.    Labs Review Labs Reviewed  CBC WITH DIFFERENTIAL/PLATELET - Abnormal; Notable for the following:    Hemoglobin 15.2 (*)    All other components within normal limits  BASIC METABOLIC PANEL  TROPONIN I    Imaging Review Dg Chest Portable 1 View  03/22/2015   CLINICAL DATA:  Chest pain extending into right upper extremity  EXAM: PORTABLE CHEST - 1 VIEW  COMPARISON:  August 23, 2013  FINDINGS: There is no edema or consolidation. There is mild right apical pleural thickening. Heart size and pulmonary vascularity are normal. No adenopathy. No pneumothorax. No bone lesions. There is postoperative change in the lower cervical spine.  IMPRESSION: No edema or consolidation.   Electronically Signed   By: Bretta Bang III [name].   On: 03/22/2015 13:40   I have personally reviewed and evaluated these images and lab results as part of my medical decision-making.   EKG Interpretation   Date/Time:  Thursday March 22 2015 13:27:14 EDT Ventricular Rate:  71 PR Interval:  165 QRS Duration: 84 QT Interval:  402 QTC Calculation: 437 R Axis:   76 Text Interpretation:  Sinus rhythm Left atrial  enlargement Minimal ST  depression Minimal ST elevation, inferior leads since last tracing no  significant change Confirmed by Truitt Cruey  MD, Virginia Curl ([id number]) on 03/22/2015  1:49:08 PM      MDM   Final diagnoses:  Nonspecific chest pain    Chest pain, nonspecific, with low risk cardiac profile. Possible gastritis with reflux. Doubt ACS, PE or pneumonia.  Nursing Notes Reviewed/ Care Coordinated Applicable Imaging Reviewed Interpretation of Laboratory Data incorporated into ED treatment  The patient appears reasonably screened and/or stabilized for discharge and I doubt any other medical condition or other [facility] requiring further screening, evaluation, or treatment in the ED at this time prior to discharge.  Plan: Home Medications- usual; Home Treatments- rest; return here if the recommended treatment, does not improve the symptoms; Recommended follow up- Cardiology for f/u eval. And possible CP. Referral sent to Dr. Felecia Jan, MD 03/22/15 [phone]

## 2015-04-10 ENCOUNTER — Telehealth (HOSPITAL_COMMUNITY): Payer: Self-pay | Admitting: Emergency Medicine

## 2015-04-10 NOTE — Telephone Encounter (Signed)
New Message  This message is to inform you that we have heard from the patient and found that she has declined the referral to consult with a cardiologist.  We will also mail a letter to the patient to inform them to call in and schedule as well.  We wanted you to be aware of our efforts. We will remove the patient from our work queue at this time.   Thank you!  Shanti [facility]

## 2015-04-24 ENCOUNTER — Encounter (HOSPITAL_COMMUNITY): Payer: Self-pay

## 2015-04-24 ENCOUNTER — Encounter (HOSPITAL_COMMUNITY)
Admission: RE | Admit: 2015-04-24 | Discharge: 2015-04-24 | Disposition: A | Discharge status: Home / Self Care | Payer: Medicaid Other | Source: Ambulatory Visit | Attending: Oral Surgery | Admitting: Oral Surgery

## 2015-04-24 DIAGNOSIS — Z01812 Encounter for preprocedural laboratory examination: Secondary | ICD-10-CM | POA: Insufficient documentation

## 2015-04-24 DIAGNOSIS — K089 Disorder of teeth and supporting structures, unspecified: Secondary | ICD-10-CM | POA: Insufficient documentation

## 2015-04-24 HISTORY — DX: Gastro-esophageal reflux disease without esophagitis: K21.9

## 2015-04-24 HISTORY — DX: Obsessive-compulsive disorder, unspecified: F42.9

## 2015-04-24 LAB — CBC
HCT: 45.2 % (ref 36.0–46.0)
Hemoglobin: 15 g/dL (ref 12.0–15.0)
MCH: 30.9 pg (ref 26.0–34.0)
MCHC: 33.2 g/dL (ref 30.0–36.0)
MCV: 93.2 fL (ref 78.0–100.0)
Platelets: 178 10*3/uL (ref 150–400)
RBC: 4.85 MIL/uL (ref 3.87–5.11)
RDW: 13.2 % (ref 11.5–15.5)
WBC: 9.1 10*3/uL (ref 4.0–10.5)

## 2015-04-24 LAB — BASIC METABOLIC PANEL
Anion gap: 10 (ref 5–15)
BUN: 8 mg/dL (ref 6–20)
CO2: 28 mmol/L (ref 22–32)
Calcium: 9.2 mg/dL (ref 8.9–10.3)
Chloride: 103 mmol/L (ref 101–111)
Creatinine, Ser: 0.85 mg/dL (ref 0.44–1.00)
GFR calc Af Amer: >60 mL/min (ref >60)
GFR calc non Af Amer: >60 mL/min (ref >60)
Glucose, Bld: 195 mg/dL — ABNORMAL HIGH (ref 65–99)
Potassium: 4.3 mmol/L (ref 3.5–5.1)
Sodium: 141 mmol/L (ref 135–145)

## 2015-04-24 NOTE — H&P (Signed)
HISTORY AND PHYSICAL  Elizabeth Fowler is a 64 y.o. female patient with CC: referred by general dentist for full mouth extractions in preparation for denture fabrication.  No diagnosis found.  Past Medical History  Diagnosis Date  . Anxiety   . Depression   . Heart murmur   . MVP (mitral valve prolapse)     mild anterior leafleat MVP with mild MR 02/26/04 echo  . Pneumonia     "I've had pneumonia probably 30 times" (08/31/2013)  . Chronic bronchitis     "got it q yr til pneumonia shot given" (08/31/2013)  . Migraines     "goes to bed dark room; last migraine maybe 5 yrs ago" (08/31/2013)  . Arthritis     "right hip" (08/31/2013)  . Chronic lower back pain   . COPD (chronic obstructive pulmonary disease)   . Shingles     3-4 MTHS AGO    No current facility-administered medications for this encounter.   Current Outpatient Prescriptions  Medication Sig Dispense Refill  . albuterol (PROVENTIL HFA;VENTOLIN HFA) 108 (90 BASE) MCG/ACT inhaler Inhale 2 puffs into the lungs every 6 (six) hours as needed for wheezing or shortness of breath.    . ALPRAZolam (XANAX) 1 MG tablet Take 1 mg by mouth 4 (four) times daily as needed for anxiety.     . diazepam (VALIUM) 10 MG tablet Take 10 mg by mouth every 6 (six) hours as needed for anxiety.    Elizabeth Fowler EPINEPHrine (EPIPEN 2-PAK) 0.3 mg/0.3 mL IJ SOAJ injection Inject 0.3 mg into the muscle once.    . gabapentin (NEURONTIN) 300 MG capsule Take 300-600 mg by mouth 2 (two) times daily.  in the AM and  in the PM    . HYDROmorphone (DILAUDID) 2 MG tablet Take 2 mg by mouth 3 (three) times daily.    . Menthol, Topical Analgesic, (ICY HOT) 5 % PADS Apply 1 patch topically daily.    . methocarbamol (ROBAXIN) 500 MG tablet Take 1 tablet (500 mg total) by mouth 3 (three) times daily. 60 tablet 0  . nicotine polacrilex (COMMIT) 2 MG lozenge Take 2 mg by mouth as needed for smoking cessation.    . NONFORMULARY OR COMPOUNDED ITEM Apply 1 application topically  daily. Topical pain cream containing Diclofenac, Baclofen, Gabapentin, Lidocaine/Prilocaine & Menthol.     Allergies  Allergen Reactions  . Bee Venom Anaphylaxis  . Codeine Anaphylaxis and Swelling  . Penicillins Anaphylaxis and Swelling    Facial swelling   Active Problems:   * No active hospital problems. *  Vitals: There were no vitals taken for this visit. Lab results:No results found for this or any previous visit (from the past 24 hour(s)). Radiology Results: No results found. General appearance: alert, cooperative and no distress Head: Normocephalic, without obvious abnormality, atraumatic Eyes: negative Nose: Nares normal. Septum midline. Mucosa normal. No drainage or sinus tenderness. Throat: multiple dental caries, periodontitis. No lesions, purulence or trismus. Pharynx clear. Right maxillary buccal exostosis, bilateral mandibular lingual tori Neck: no adenopathy, supple, symmetrical, trachea midline and thyroid not enlarged, symmetric, no tenderness/mass/nodules Resp: clear to auscultation bilaterally Cardio: regular rate and rhythm, S1, S2 normal, no murmur, click, rub or gallop  Assessment: Dental caries and periodontitis. All teeth  Nonrestorable. Maxillary exostosis, bilateral mandibular tori.  Plan: Fulll mouth extraction with alveoloplasty. Removal maxillary exostosis and mandibular tori. General anesthesia. Day surgery.   Elizabeth Fowler 04/24/2015

## 2015-04-24 NOTE — Progress Notes (Signed)
PCP - Dr. Theodis Shove Cardiologist - denies  EKG - 03/23/15 - Epic CXR - 1 view - 03/22/15 - Epic  Echo/Stress Test/Cardiac Cath - denies  Patient denies shortness of breath and chest pain at PAT appointment.

## 2015-04-24 NOTE — Pre-Procedure Instructions (Signed)
    Elizabeth Fowler  04/24/2015      [facility] - [place], [place] - [address] [place] [address] Phone: [phone] Fax: [phone]    Your procedure is scheduled on Friday, October 7th, 2016.  Report to [facility] Admitting at 7:00 A.M.  Call this number if you have problems the morning of surgery:  [phone]   Remember:  Do not eat food or drink liquids after midnight.   Take these medicines the morning of surgery with A SIP OF WATER: Albuterol inhaler if needed (please bring with you), Alprazolam (Xanax) if needed, Diazepam (Valium) if needed, Epinephrine if needed, Gabapentin (neurontin), Hydromorphone (Dilaudid), Methocarbamol (Robaxin).  Stop taking: Aspirin, NSAIDS, Aleve, Naproxen, Ibuprofen, Motrin, Advil, Fish Oil, all herbal medications, and all vitamins.    Do not wear jewelry, make-up or nail polish.  Do not wear lotions, powders, or perfumes.  You may wear deodorant.  Do not shave 48 hours prior to surgery.    Do not bring valuables to the hospital.  [facility] is not responsible for any belongings or valuables.  Contacts, dentures or bridgework may not be worn into surgery.  Leave your suitcase in the car.  After surgery it may be brought to your room.  For patients admitted to the hospital, discharge time will be determined by your treatment team.  Patients discharged the day of surgery will not be allowed to drive home.   Special instructions:  See attached.   Please read over the following fact sheets that you were given. Pain Booklet, Coughing and Deep Breathing and Surgical Site Infection Prevention
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
## 2015-04-26 MED ORDER — CLINDAMYCIN PHOSPHATE 600 MG/50ML IV SOLN
600.0000 mg | INTRAVENOUS | Status: AC
  Administered 2015-04-27: 600 mg via INTRAVENOUS
  Filled 2015-04-26: qty 50

## 2015-04-27 ENCOUNTER — Ambulatory Visit (HOSPITAL_COMMUNITY): Payer: Medicare Other | Admitting: Certified Registered Nurse Anesthetist

## 2015-04-27 ENCOUNTER — Ambulatory Visit (HOSPITAL_COMMUNITY)
Admission: RE | Admit: 2015-04-27 | Discharge: 2015-04-27 | Disposition: A | Discharge status: Home / Self Care | Payer: Medicare Other | Source: Ambulatory Visit | Attending: Oral Surgery | Admitting: Oral Surgery

## 2015-04-27 ENCOUNTER — Encounter (HOSPITAL_COMMUNITY): Payer: Self-pay | Admitting: *Deleted

## 2015-04-27 ENCOUNTER — Encounter (HOSPITAL_COMMUNITY)
Admission: RE | Disposition: A | Discharge status: Home / Self Care | Payer: Self-pay | Source: Ambulatory Visit | Attending: Oral Surgery

## 2015-04-27 DIAGNOSIS — M13851 Other specified arthritis, right hip: Secondary | ICD-10-CM | POA: Diagnosis not present

## 2015-04-27 DIAGNOSIS — Z885 Allergy status to narcotic agent status: Secondary | ICD-10-CM | POA: Diagnosis not present

## 2015-04-27 DIAGNOSIS — M270 Developmental disorders of jaws: Secondary | ICD-10-CM | POA: Insufficient documentation

## 2015-04-27 DIAGNOSIS — F329 Major depressive disorder, single episode, unspecified: Secondary | ICD-10-CM | POA: Diagnosis not present

## 2015-04-27 DIAGNOSIS — K029 Dental caries, unspecified: Secondary | ICD-10-CM | POA: Insufficient documentation

## 2015-04-27 DIAGNOSIS — K0889 Other specified disorders of teeth and supporting structures: Secondary | ICD-10-CM | POA: Diagnosis not present

## 2015-04-27 DIAGNOSIS — F419 Anxiety disorder, unspecified: Secondary | ICD-10-CM | POA: Insufficient documentation

## 2015-04-27 DIAGNOSIS — I341 Nonrheumatic mitral (valve) prolapse: Secondary | ICD-10-CM | POA: Diagnosis not present

## 2015-04-27 DIAGNOSIS — G8929 Other chronic pain: Secondary | ICD-10-CM | POA: Insufficient documentation

## 2015-04-27 DIAGNOSIS — M2607 Excessive tuberosity of jaw: Secondary | ICD-10-CM | POA: Insufficient documentation

## 2015-04-27 DIAGNOSIS — M545 Low back pain: Secondary | ICD-10-CM | POA: Insufficient documentation

## 2015-04-27 DIAGNOSIS — K0530 Chronic periodontitis, unspecified: Secondary | ICD-10-CM | POA: Diagnosis not present

## 2015-04-27 DIAGNOSIS — Z880 Allergy status to penicillin: Secondary | ICD-10-CM | POA: Diagnosis not present

## 2015-04-27 DIAGNOSIS — J449 Chronic obstructive pulmonary disease, unspecified: Secondary | ICD-10-CM | POA: Insufficient documentation

## 2015-04-27 DIAGNOSIS — R011 Cardiac murmur, unspecified: Secondary | ICD-10-CM | POA: Insufficient documentation

## 2015-04-27 DIAGNOSIS — Z91030 Bee allergy status: Secondary | ICD-10-CM | POA: Insufficient documentation

## 2015-04-27 HISTORY — PX: MULTIPLE EXTRACTIONS WITH ALVEOLOPLASTY: SHX5342

## 2015-04-27 SURGERY — MULTIPLE EXTRACTION WITH ALVEOLOPLASTY
Anesthesia: General | Site: Mouth | Laterality: Bilateral

## 2015-04-27 MED ORDER — MIDAZOLAM HCL 2 MG/2ML IJ SOLN
INTRAMUSCULAR | Status: AC
  Filled 2015-04-27: qty 4

## 2015-04-27 MED ORDER — LACTATED RINGERS IV SOLN
INTRAVENOUS | Status: DC
  Administered 2015-04-27: 07:00:00 via INTRAVENOUS

## 2015-04-27 MED ORDER — EPHEDRINE SULFATE 50 MG/ML IJ SOLN
INTRAMUSCULAR | Status: AC
  Filled 2015-04-27: qty 1

## 2015-04-27 MED ORDER — OXYMETAZOLINE HCL 0.05 % NA SOLN
NASAL | Status: AC
  Filled 2015-04-27: qty 15

## 2015-04-27 MED ORDER — ONDANSETRON HCL 4 MG/2ML IJ SOLN
INTRAMUSCULAR | Status: AC
  Filled 2015-04-27: qty 2

## 2015-04-27 MED ORDER — STERILE WATER FOR INJECTION IJ SOLN
INTRAMUSCULAR | Status: AC
  Filled 2015-04-27: qty 10

## 2015-04-27 MED ORDER — HYDROMORPHONE HCL 1 MG/ML IJ SOLN
0.2500 mg | INTRAMUSCULAR | Status: DC | PRN
  Administered 2015-04-27 (×2): 0.5 mg via INTRAVENOUS

## 2015-04-27 MED ORDER — EPHEDRINE SULFATE 50 MG/ML IJ SOLN
INTRAMUSCULAR | Status: DC | PRN
  Administered 2015-04-27 (×2): 5 mg via INTRAVENOUS

## 2015-04-27 MED ORDER — SUCCINYLCHOLINE CHLORIDE 20 MG/ML IJ SOLN
INTRAMUSCULAR | Status: DC | PRN
  Administered 2015-04-27: 140 mg via INTRAVENOUS

## 2015-04-27 MED ORDER — ONDANSETRON HCL 4 MG/2ML IJ SOLN: 4.0000 mg | Freq: Once | INTRAMUSCULAR | Status: DC | PRN

## 2015-04-27 MED ORDER — PROPOFOL 10 MG/ML IV BOLUS
INTRAVENOUS | Status: AC
  Filled 2015-04-27: qty 20

## 2015-04-27 MED ORDER — MIDAZOLAM HCL 5 MG/5ML IJ SOLN
INTRAMUSCULAR | Status: DC | PRN
  Administered 2015-04-27 (×2): 1 mg via INTRAVENOUS

## 2015-04-27 MED ORDER — LIDOCAINE HCL (CARDIAC) 20 MG/ML IV SOLN
INTRAVENOUS | Status: DC | PRN
  Administered 2015-04-27: 100 mg via INTRAVENOUS

## 2015-04-27 MED ORDER — SODIUM CHLORIDE 0.9 % IR SOLN
Status: DC | PRN
  Administered 2015-04-27: 1

## 2015-04-27 MED ORDER — FENTANYL CITRATE (PF) 250 MCG/5ML IJ SOLN
INTRAMUSCULAR | Status: AC
  Filled 2015-04-27: qty 5

## 2015-04-27 MED ORDER — OXYMETAZOLINE HCL 0.05 % NA SOLN
NASAL | Status: DC | PRN
  Administered 2015-04-27: 1 via NASAL

## 2015-04-27 MED ORDER — HYDROMORPHONE HCL 1 MG/ML IJ SOLN
INTRAMUSCULAR | Status: AC
  Administered 2015-04-27: 0.5 mg via INTRAVENOUS
  Filled 2015-04-27: qty 1

## 2015-04-27 MED ORDER — HYDROMORPHONE HCL 4 MG PO TABS: 4.0000 mg | ORAL_TABLET | ORAL | Status: DC | PRN

## 2015-04-27 MED ORDER — SUCCINYLCHOLINE CHLORIDE 20 MG/ML IJ SOLN
INTRAMUSCULAR | Status: AC
  Filled 2015-04-27: qty 1

## 2015-04-27 MED ORDER — ONDANSETRON HCL 4 MG/2ML IJ SOLN
INTRAMUSCULAR | Status: DC | PRN
  Administered 2015-04-27: 4 mg via INTRAVENOUS

## 2015-04-27 MED ORDER — PHENYLEPHRINE HCL 10 MG/ML IJ SOLN
INTRAMUSCULAR | Status: DC | PRN
  Administered 2015-04-27: 80 ug via INTRAVENOUS
  Administered 2015-04-27 (×2): 120 ug via INTRAVENOUS
  Administered 2015-04-27: 80 ug via INTRAVENOUS

## 2015-04-27 MED ORDER — 0.9 % SODIUM CHLORIDE (POUR BTL) OPTIME
TOPICAL | Status: DC | PRN
  Administered 2015-04-27: 1000 mL

## 2015-04-27 MED ORDER — LIDOCAINE-EPINEPHRINE 2 %-1:100000 IJ SOLN
INTRAMUSCULAR | Status: AC
  Filled 2015-04-27: qty 1

## 2015-04-27 MED ORDER — LIDOCAINE HCL (CARDIAC) 20 MG/ML IV SOLN
INTRAVENOUS | Status: AC
  Filled 2015-04-27: qty 5

## 2015-04-27 MED ORDER — LIDOCAINE-EPINEPHRINE 2 %-1:100000 IJ SOLN
INTRAMUSCULAR | Status: DC | PRN
  Administered 2015-04-27: 20 mL via INTRADERMAL

## 2015-04-27 MED ORDER — PHENYLEPHRINE HCL 10 MG/ML IJ SOLN
10.0000 mg | INTRAVENOUS | Status: DC | PRN
  Administered 2015-04-27: 20 ug/min via INTRAVENOUS

## 2015-04-27 MED ORDER — PROPOFOL 10 MG/ML IV BOLUS
INTRAVENOUS | Status: DC | PRN
  Administered 2015-04-27: 130 mg via INTRAVENOUS

## 2015-04-27 MED ORDER — MEPERIDINE HCL 25 MG/ML IJ SOLN: 6.2500 mg | INTRAMUSCULAR | Status: DC | PRN

## 2015-04-27 MED ORDER — FENTANYL CITRATE (PF) 100 MCG/2ML IJ SOLN
INTRAMUSCULAR | Status: DC | PRN
  Administered 2015-04-27: 150 ug via INTRAVENOUS

## 2015-04-27 SURGICAL SUPPLY — 29 items
BUR CROSS CUT FISSURE 1.6 (BURR) ×2 IMPLANT
BUR CROSS CUT FISSURE 1.6MM (BURR) ×1
BUR EGG ELITE 4.0 (BURR) IMPLANT
BUR EGG ELITE 4.0MM (BURR)
CANISTER SUCTION 2500CC (MISCELLANEOUS) ×3 IMPLANT
COVER SURGICAL LIGHT HANDLE (MISCELLANEOUS) ×3 IMPLANT
CRADLE DONUT ADULT HEAD (MISCELLANEOUS) ×3 IMPLANT
FLUID NSS /IRRIG 1000 ML XXX (MISCELLANEOUS) ×3 IMPLANT
GAUZE PACKING FOLDED 2  STR (GAUZE/BANDAGES/DRESSINGS) ×2
GAUZE PACKING FOLDED 2 STR (GAUZE/BANDAGES/DRESSINGS) ×1 IMPLANT
GLOVE BIO SURGEON STRL SZ 6.5 (GLOVE) ×2 IMPLANT
GLOVE BIO SURGEON STRL SZ7.5 (GLOVE) ×3 IMPLANT
GLOVE BIO SURGEONS STRL SZ 6.5 (GLOVE) ×1
GLOVE BIOGEL PI IND STRL 7.0 (GLOVE) ×1 IMPLANT
GLOVE BIOGEL PI INDICATOR 7.0 (GLOVE) ×2
GOWN STRL REUS W/ TWL LRG LVL3 (GOWN DISPOSABLE) ×1 IMPLANT
GOWN STRL REUS W/ TWL XL LVL3 (GOWN DISPOSABLE) ×1 IMPLANT
GOWN STRL REUS W/TWL LRG LVL3 (GOWN DISPOSABLE) ×3
GOWN STRL REUS W/TWL XL LVL3 (GOWN DISPOSABLE) ×3
KIT BASIN OR (CUSTOM PROCEDURE TRAY) ×3 IMPLANT
KIT ROOM TURNOVER OR (KITS) ×3 IMPLANT
NEEDLE 22X1 1/2 (OR ONLY) (NEEDLE) ×6 IMPLANT
NS IRRIG 1000ML POUR BTL (IV SOLUTION) ×3 IMPLANT
PAD ARMBOARD 7.5X6 YLW CONV (MISCELLANEOUS) ×3 IMPLANT
SUT CHROMIC 3 0 PS 2 (SUTURE) ×6 IMPLANT
SYR CONTROL 10ML LL (SYRINGE) ×3 IMPLANT
TRAY ENT MC OR (CUSTOM PROCEDURE TRAY) ×3 IMPLANT
TUBING IRRIGATION (MISCELLANEOUS) ×3 IMPLANT
YANKAUER SUCT BULB TIP NO VENT (SUCTIONS) ×3 IMPLANT

## 2015-04-27 NOTE — Anesthesia Preprocedure Evaluation (Addendum)
Anesthesia Evaluation  Patient identified by MRN, date of birth, ID band Patient awake    Reviewed: Allergy & Precautions, NPO status , Patient's Chart, lab work & pertinent test results  History of Anesthesia Complications (+) PROLONGED EMERGENCE and history of anesthetic complications  Airway Mallampati: II  TM Distance: >3 FB Neck ROM: Full    Dental  (+) Dental Advisory Given, Loose   Pulmonary pneumonia, resolved, COPD,  COPD inhaler, Current Smoker,    Pulmonary exam normal breath sounds clear to auscultation       Cardiovascular (-) hypertensionNormal cardiovascular exam     Neuro/Psych  Headaches, PSYCHIATRIC DISORDERS Anxiety Depression    GI/Hepatic Neg liver ROS, GERD  Medicated and Controlled,  Endo/Other  negative endocrine ROS  Renal/GU negative Renal ROS     Musculoskeletal  (+) Arthritis ,   Abdominal   Peds  Hematology negative hematology ROS (+)   Anesthesia Other Findings   Reproductive/Obstetrics                          Anesthesia Physical Anesthesia Plan  ASA: III  Anesthesia Plan: General   Post-op Pain Management:    Induction: Intravenous  Airway Management Planned: Nasal ETT  Additional Equipment:   Intra-op Plan:   Post-operative Plan: Extubation in OR  Informed Consent: I have reviewed the patients History and Physical, chart, labs and discussed the procedure including the risks, benefits and alternatives for the proposed anesthesia with the patient or authorized representative who has indicated his/her understanding and acceptance.   Dental advisory given  Plan Discussed with: CRNA and Surgeon  Anesthesia Plan Comments:        Anesthesia Quick Evaluation

## 2015-04-27 NOTE — H&P (Signed)
H&P documentation  -History and Physical Reviewed  -Patient has been re-examined  -No change in the plan of care  Nasirah Sachs M  

## 2015-04-27 NOTE — Anesthesia Procedure Notes (Addendum)
Procedure Name: Intubation Date/Time: 04/27/2015 8:56 AM Performed by: Faustino Congress Braxley Balandran Pre-anesthesia Checklist: Patient identified, Emergency Drugs available, Suction available and Patient being monitored Patient Re-evaluated:Patient Re-evaluated prior to inductionOxygen Delivery Method: Circle system utilized Preoxygenation: Pre-oxygenation with 100% oxygen Intubation Type: IV induction Ventilation: Mask ventilation without difficulty Laryngoscope Size: Miller and 2 Grade View: Grade I Nasal Tubes: Nasal Rae, Magill forceps- large, utilized and Nasal prep performed Tube size: 7.0 mm Number of attempts: 1 Placement Confirmation: ETT inserted through vocal cords under direct vision,  positive ETCO2 and breath sounds checked- equal and bilateral Tube secured with: Tape Dental Injury: Teeth and Oropharynx as per pre-operative assessment

## 2015-04-27 NOTE — Anesthesia Postprocedure Evaluation (Signed)
Anesthesia Post Note  Patient: Elizabeth Fowler  Procedure(s) Performed: Procedure(s) (LRB): EXTRACTIONS WITH BILATERAL TORI, MAX (R) TORI (Bilateral)  Anesthesia type: general  Patient location: PACU  Post pain: Pain level controlled  Post assessment: Patient's Cardiovascular Status Stable  Last Vitals:  Filed Vitals:   04/27/15 1133  BP:   Pulse: 91  Temp:   Resp: 15    Post vital signs: Reviewed and stable  Level of consciousness: sedated  Complications: No apparent anesthesia complications

## 2015-04-27 NOTE — Transfer of Care (Signed)
Immediate Anesthesia Transfer of Care Note  Patient: Elizabeth Fowler  Procedure(s) Performed: Procedure(s): EXTRACTIONS WITH BILATERAL TORI, MAX (R) TORI (Bilateral)  Patient Location: PACU  Anesthesia Type:General  Level of Consciousness: sedated and responds to stimulation  Airway & Oxygen Therapy: Patient Spontanous Breathing and Patient connected to face mask oxygen  Post-op Assessment: Report given to RN and Post -op Vital signs reviewed and stable  Post vital signs: Reviewed and stable  Last Vitals:  Filed Vitals:   04/27/15 0946  BP:   Pulse:   Temp: 36.5 C  Resp:     Complications: No apparent anesthesia complications

## 2015-04-27 NOTE — Op Note (Signed)
04/27/2015  9:34 AM  PATIENT:  Elizabeth Fowler  64 y.o. female  PRE-OPERATIVE DIAGNOSIS:  non restorable teeth #'s 5, 6, 7, 8, 9, 10, 11, 12, `13, 14, 18, 19, 20, 22, 23, 26, 27, 28, 29, 30, 31, RIGHT  mandibular lingual torus, Hyperplastic Right maxillary fibrous tuberosity  POST-OPERATIVE DIAGNOSIS:  SAME+ lEFT MANDIBULAR LINGUAL EXOSTOSES  PROCEDURE:  Procedure(s): EXTRACTIONS teeth #'s 5, 6, 7, 8, 9, 10, 11, 12, `13, 14, 18, 19, 20, 22, 23, 26, 27, 28, 29, 30, 31WITH ALVEOLOPLASTY; REMOVAL RIGHT  MANDIBULAR TORUS, REMOVAL LEFT MANDIBULAR LINGUAL EXOSTOSES. REDUCTION HYPERPLASTIC RIGHT MAXILLARY FIBROUS TUBEROSITY   SURGEON:  Surgeon(s): Ocie Doyne, DDS  ANESTHESIA:   local and general  EBL:  minimal  DRAINS: none   SPECIMEN:  No Specimen  COUNTS:  YES  PLAN OF CARE: Discharge to home after PACU  PATIENT DISPOSITION:  PACU - hemodynamically stable.   PROCEDURE DETAILS: Dictation # [id number]  [name], DMD 04/27/2015 9:34 AM

## 2015-04-27 NOTE — Progress Notes (Signed)
Lunch relief by MA Darrio Bade RN 

## 2015-04-28 NOTE — Op Note (Signed)
NAME:  Elizabeth Fowler, Elizabeth Fowler                 ACCOUNT NO.:  [id number]  MEDICAL RECORD NO.:  [id number]  LOCATION:  [facility]                         FACILITY:  [facility]  PHYSICIAN:  [name], [name].  DATE OF BIRTH:  15-Sep-1950  DATE OF PROCEDURE:  04/27/2015 DATE OF DISCHARGE:  04/27/2015                              OPERATIVE REPORT   PREOPERATIVE DIAGNOSIS:  Nonrestorable teeth numbers 5, 6, 7, 8, 9, 10, 11, 12, 13, 14, 18, 19, 20, 22, 23, 26, 27, 28, 29, 30, 31, right mandibular lingual torus and hyperplastic right maxillary fibrous tuberosity.  POSTOPERATIVE DIAGNOSIS:  Nonrestorable teeth numbers 5, 6, 7, 8, 9, 10, 11, 12, 13, 14, 18, 19, 20, 22, 23, 26, 27, 28, 29, 30, 31, right mandibular lingual torus and hyperplastic right maxillary fibrous tuberosity plus left mandibular lingual exostosis.  PROCEDURE:  Extraction of teeth numbers 5, 6, 7, 8, 9, 10, 11, 12, 13, 14, 18, 19, 20, 22, 23, 26, 27, 28, 29, 30, 31, alveoplasty right and left maxilla and mandible.  Removal of right mandibular lingual torus, removal of left mandibular lingual exostoses, reduction hyperplastic right maxillary fibrous tuberosity.  SURGEON:  [name], [name].  ANESTHESIA:  General, nasal intubation, Dr. Michelle Piper attending.  DESCRIPTION OF PROCEDURE:  The patient was taken to the operating room, placed on the table in supine position.  General anesthesia was administered intravenously, and a nasal endotracheal tube was placed and secured.  The eyes were protected, and the patient was draped for the procedure.  Time-out was performed.  The posterior pharynx was suctioned.  A throat pack was placed.  A 2% lidocaine with 1:100,000 epinephrine was infiltrated in an inferior alveolar block on the right and left side and buccal and palatal infiltration of the maxilla. Additional buccal anesthesia was given in the anterior mandible, a total of 20 mL was utilized.  The sweetheart retractor and the bite block  were placed on the right side of the mouth, exposing the left side adequately.  Then, #15 blade was used to make an incision beginning at tooth #18, carrying anteriorly both buccally and lingually in the gingival sulcus until tooth #27 was encountered in the maxilla. Incision was created around teeth numbers 14 anteriorly to tooth numbers 6 both buccally and palatally to the gingival sulcus.  The periosteum was reflected in the maxilla and mandible using a periosteal elevator. The teeth were elevated with 301 elevator and then teeth numbers 7, 8, 9, 10, 11, 12, 13, 14, 18, 19, 20, 22, 23, and 26 were removed using dental forceps.  The gingival tissue was then trimmed with a 15 blade to allow for primary closure and then the sockets were curetted and the tissue was debrided with a rongeur.  Then, the periosteum was reflected in the maxilla and mandible on the left side to expose the alveolar crest and ridge.  In the mandible, there were several small 2-3 mm exostoses along the lingual border of the mandible.  These were reduced using the egg-shaped bur and bone file.  Then, alveoplasty was performed in the left maxilla and mandible using the egg-shaped bur and bone file as well.  Then, the  areas were irrigated and closed with 3-0 chromic. The bite block and sweetheart retractor were repositioned the other side of the mouth and a 15 blade was used to make an incision around teeth numbers 27, 28, 29, 30, and 31 in the mandible and buccal and lingual aspects and around tooth numbers 5 and 6 in the maxilla.  Upon opening and closing the mouth, it was noted that the tuberosity region in the right maxilla was impinging upon the lower tissues, so decision was made to reduce this tissue to allow room for a denture fabrication.  Then, a 15 blade was used to make a wedge incision in the right tuberosity region.  This incision was connected with the incision previously made in the area of teeth  numbers 5 and 6.  Then the periosteum was reflected in the maxilla and mandible on the right side using a periosteal elevator.  The teeth were elevated with a 301 elevator and dental forcep was used to extract teeth numbers 5, 6, 27, 28, 29, 30 and 31.  Then, the 15 blade was used to trim the tissue to achieve primary closure and residual tissue was removed with the rongeur and the top sockets were curetted.  Then, the periosteum was reflected with the periosteal elevator to expose the alveolar crest in the right maxilla and mandible. Alveoplasty was performed using the egg-shaped bur.  There was a large 1 cm x 1.5 cm mandibular lingual torus present on the right side.  This was reduced using the egg bur and bone file.  Then, the areas were irrigated and closed with 3-0 chromic.  The oral cavity was then inspected and found to have good contour, hemostasis, and closure.  The oral cavity was irrigated and suctioned.  Throat pack was removed.  The patient was awakened, taken to recovery room, breathing spontaneously in good condition.  ESTIMATED BLOOD LOSS:  Minimal.  COMPLICATIONS:  None.  SPECIMENS:  None.     [name], [name].     SMJ/MEDQ  D:  04/27/2015  T:  04/28/2015  Job:  [id number]

## 2015-04-30 ENCOUNTER — Encounter (HOSPITAL_COMMUNITY): Payer: Self-pay | Admitting: Oral Surgery

## 2015-05-20 IMAGING — CR DG CHEST 2V
2 series · 2 of 2 positions shown · non-contrast
Comparison: Chest x-ray dated 12/03/2009

CLINICAL DATA: Lumbar disc disease. Pre operative respiratory exam.

EXAM:
CHEST  2 VIEW

[w chest pa]
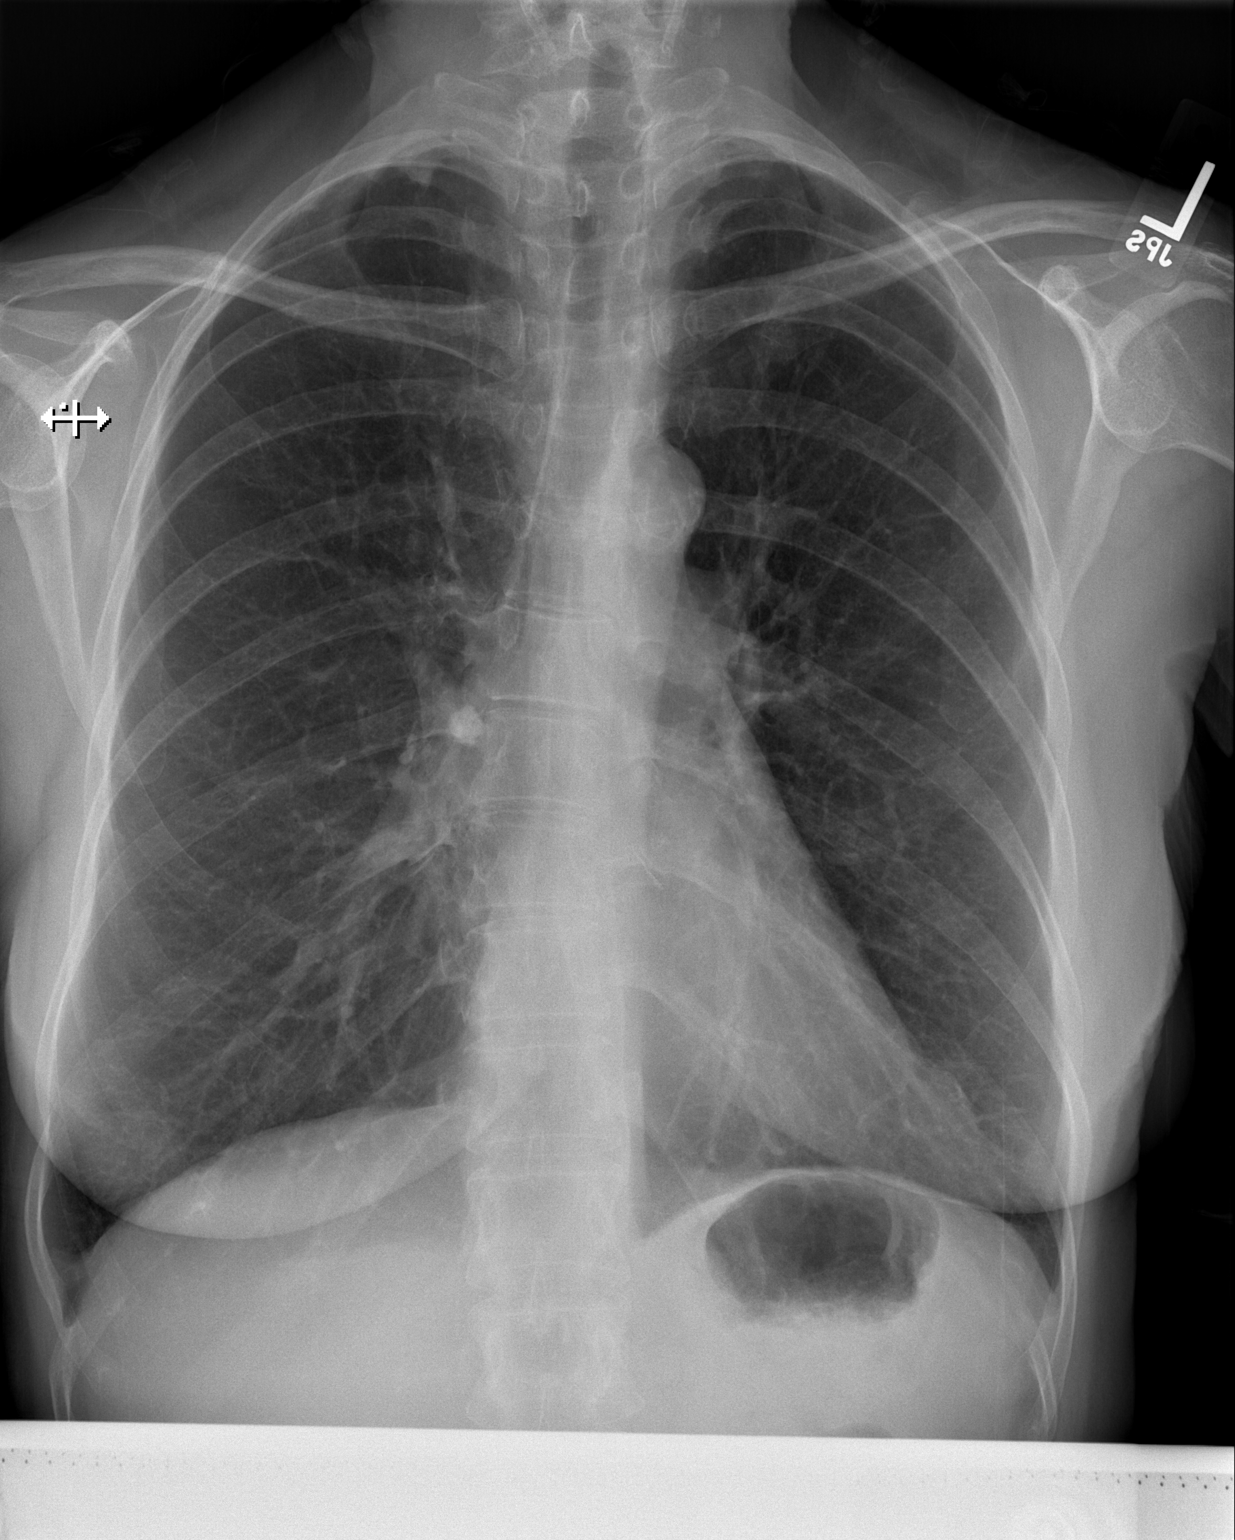

[w chest lat]
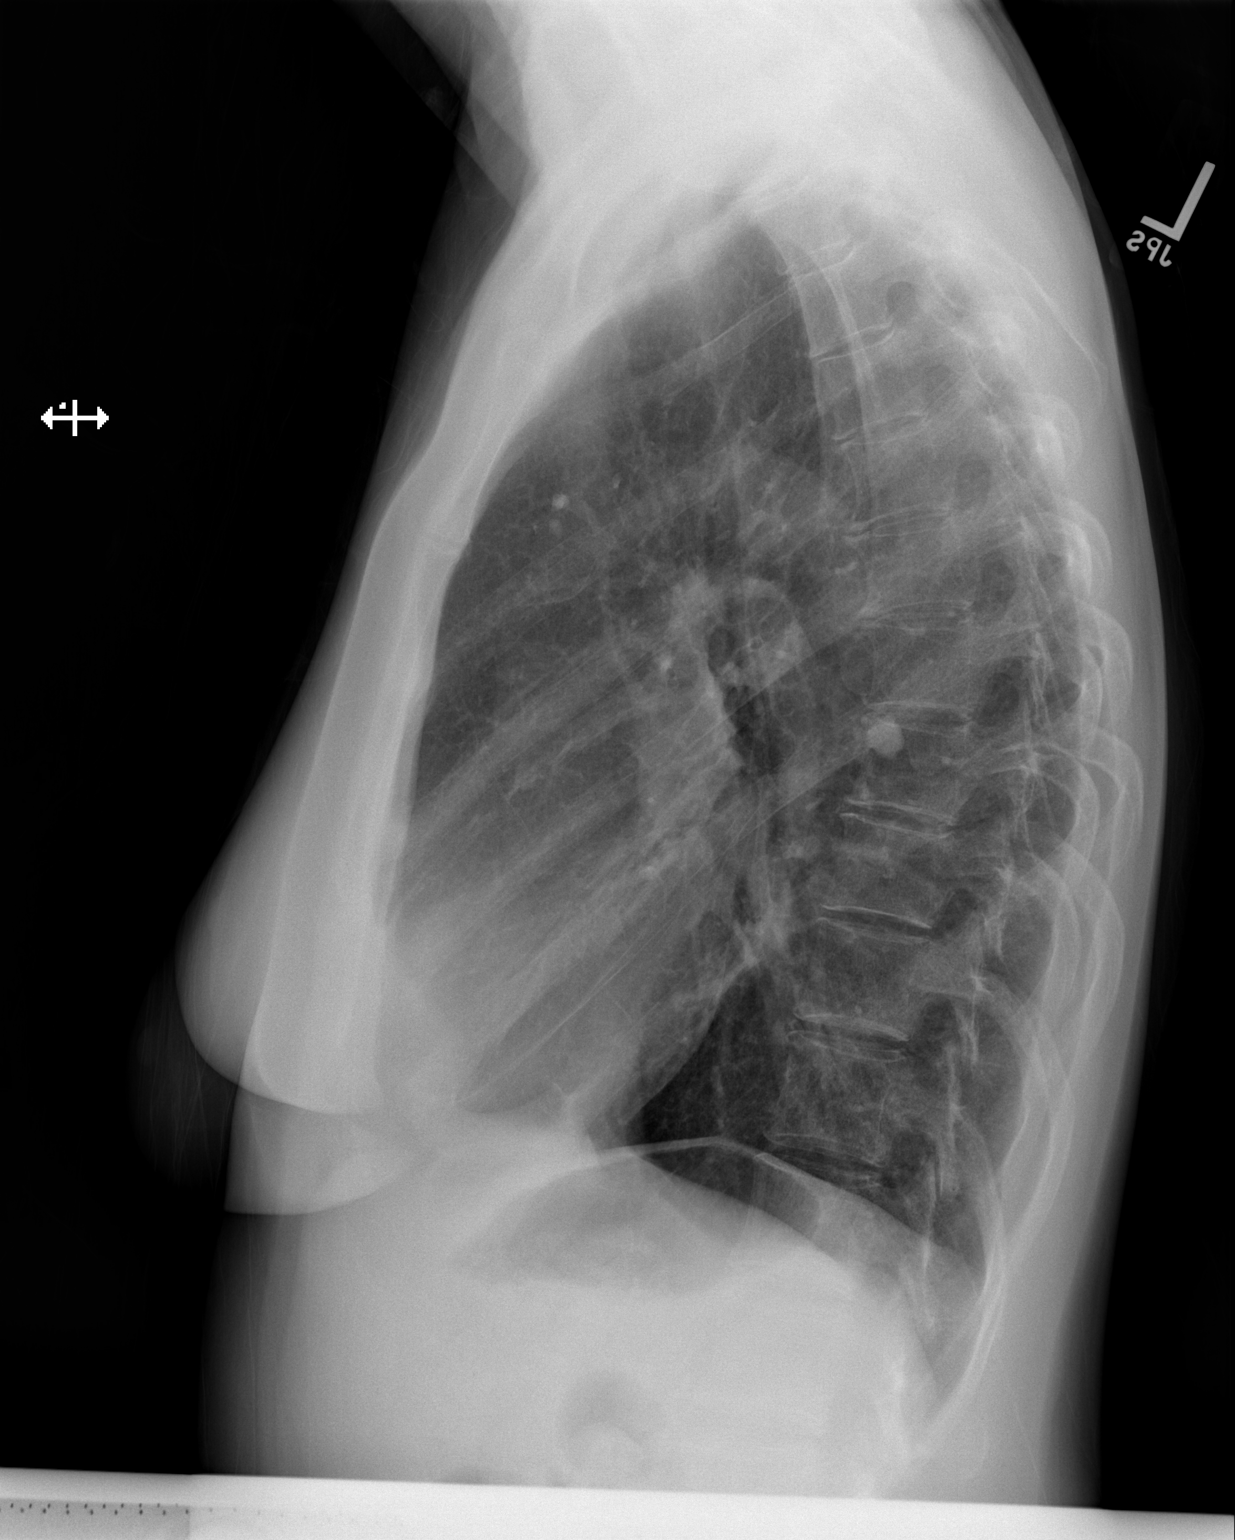

[2 of 2 positions shown; findings below may reference images not displayed]

FINDINGS: Heart size and pulmonary vascularity are normal. Calcified
granulomas in the right lung. No infiltrates or effusions. Slight
stable bilateral apical pleural thickening. Slight thoracic
scoliosis, stable.
IMPRESSION: No acute abnormalities.

## 2015-05-20 IMAGING — CR DG LUMBAR SPINE 2-3V
2 series · 2 of 2 positions shown · non-contrast
Comparison: DG LUMBAR SPINE 2-3 VIEWS dated 05/13/2012; MR-L-SPINE
dated 02/04/2008

CLINICAL DATA: Preop for lumbar spine fusion.

EXAM:
LUMBAR SPINE - 2-3 VIEW

[t lumbar spine ap]
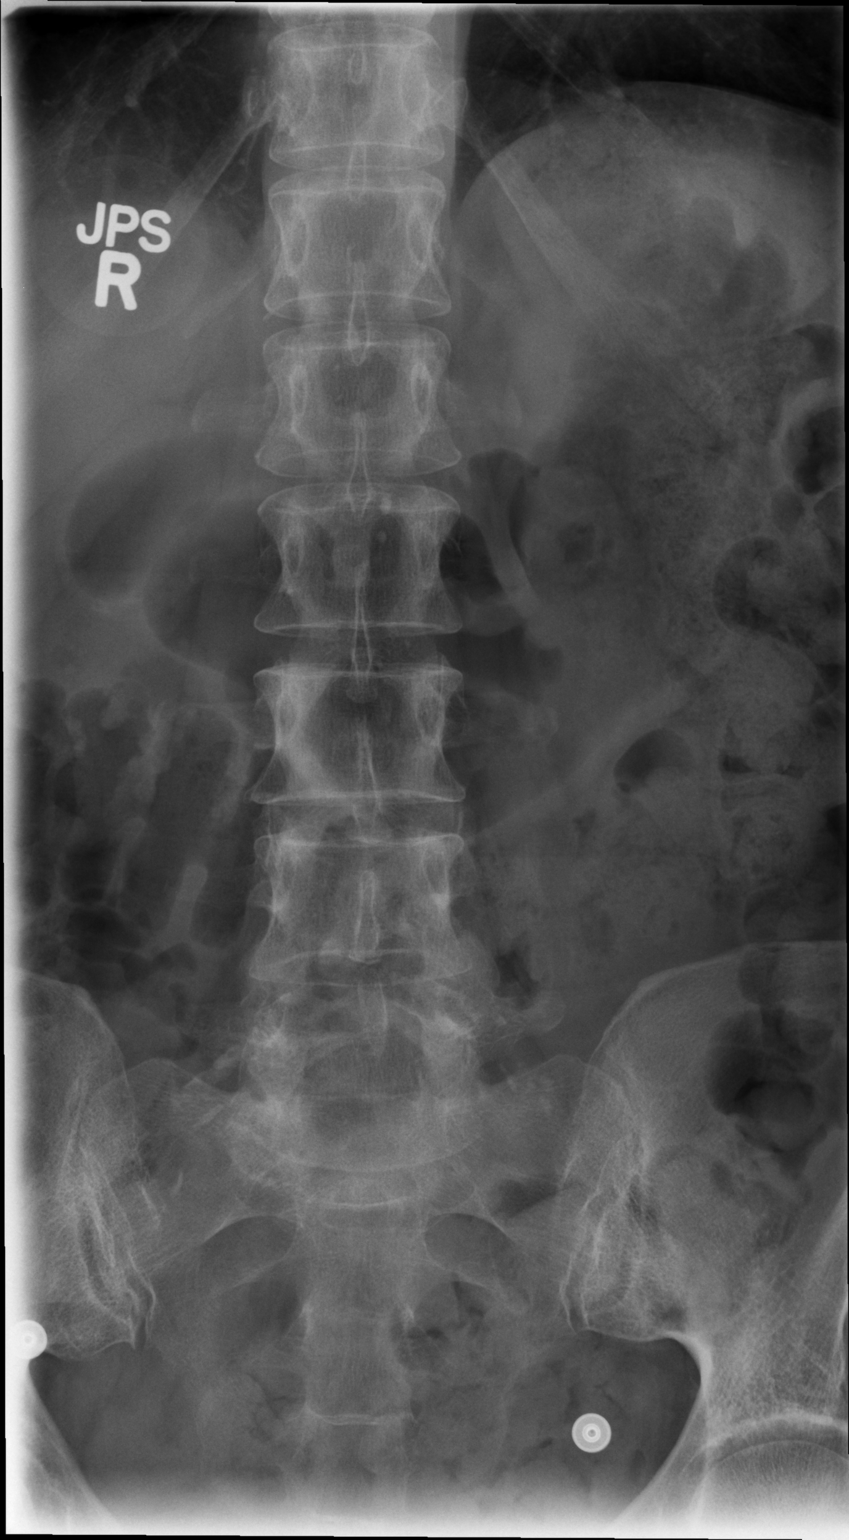

[t lumbar spine lat]
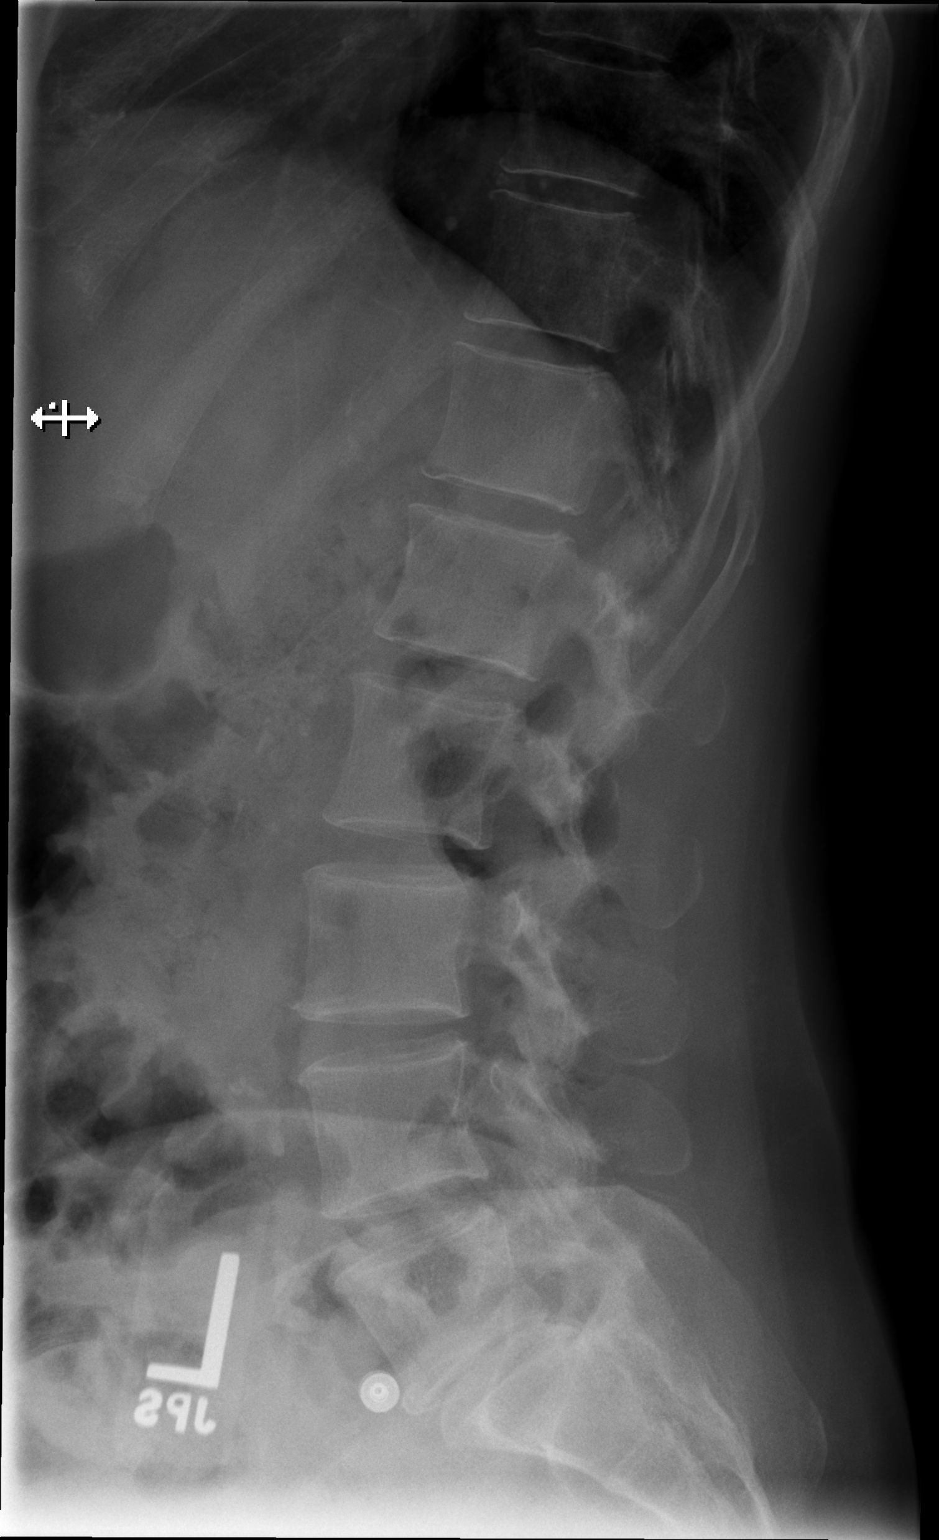

[2 of 2 positions shown; findings below may reference images not displayed]

FINDINGS: Diminutive twelfth ribs. Presuming this nomenclature (and that of
the 02/04/2008 MRI), 5 lumbar type vertebral bodies. Degenerative
disc disease at the lumbosacral junction. Maintenance of vertebral
body height and alignment.
IMPRESSION: Degenerative disc disease at the lumbosacral junction.

## 2015-08-07 DIAGNOSIS — Z4789 Encounter for other orthopedic aftercare: Secondary | ICD-10-CM | POA: Diagnosis not present

## 2015-08-08 ENCOUNTER — Other Ambulatory Visit: Payer: Self-pay | Admitting: Orthopedic Surgery

## 2015-08-10 DIAGNOSIS — G894 Chronic pain syndrome: Secondary | ICD-10-CM | POA: Diagnosis not present

## 2015-08-10 DIAGNOSIS — Z1389 Encounter for screening for other disorder: Secondary | ICD-10-CM | POA: Diagnosis not present

## 2015-08-10 DIAGNOSIS — F419 Anxiety disorder, unspecified: Secondary | ICD-10-CM | POA: Diagnosis not present

## 2015-08-10 DIAGNOSIS — Z6821 Body mass index (BMI) 21.0-21.9, adult: Secondary | ICD-10-CM | POA: Diagnosis not present

## 2015-09-10 DIAGNOSIS — Z0001 Encounter for general adult medical examination with abnormal findings: Secondary | ICD-10-CM | POA: Diagnosis not present

## 2015-09-10 DIAGNOSIS — Z6821 Body mass index (BMI) 21.0-21.9, adult: Secondary | ICD-10-CM | POA: Diagnosis not present

## 2015-09-10 DIAGNOSIS — G894 Chronic pain syndrome: Secondary | ICD-10-CM | POA: Diagnosis not present

## 2015-09-10 DIAGNOSIS — Z1389 Encounter for screening for other disorder: Secondary | ICD-10-CM | POA: Diagnosis not present

## 2015-10-08 DIAGNOSIS — G894 Chronic pain syndrome: Secondary | ICD-10-CM | POA: Diagnosis not present

## 2015-10-08 DIAGNOSIS — Z1389 Encounter for screening for other disorder: Secondary | ICD-10-CM | POA: Diagnosis not present

## 2015-10-08 DIAGNOSIS — F419 Anxiety disorder, unspecified: Secondary | ICD-10-CM | POA: Diagnosis not present

## 2015-10-08 DIAGNOSIS — Z6821 Body mass index (BMI) 21.0-21.9, adult: Secondary | ICD-10-CM | POA: Diagnosis not present

## 2015-12-11 DIAGNOSIS — Z6821 Body mass index (BMI) 21.0-21.9, adult: Secondary | ICD-10-CM | POA: Diagnosis not present

## 2015-12-11 DIAGNOSIS — G894 Chronic pain syndrome: Secondary | ICD-10-CM | POA: Diagnosis not present

## 2015-12-11 DIAGNOSIS — L309 Dermatitis, unspecified: Secondary | ICD-10-CM | POA: Diagnosis not present

## 2015-12-11 DIAGNOSIS — F419 Anxiety disorder, unspecified: Secondary | ICD-10-CM | POA: Diagnosis not present

## 2016-01-07 DIAGNOSIS — Z4789 Encounter for other orthopedic aftercare: Secondary | ICD-10-CM | POA: Diagnosis not present

## 2016-01-21 DIAGNOSIS — Z6821 Body mass index (BMI) 21.0-21.9, adult: Secondary | ICD-10-CM | POA: Diagnosis not present

## 2016-01-21 DIAGNOSIS — G894 Chronic pain syndrome: Secondary | ICD-10-CM | POA: Diagnosis not present

## 2016-01-21 DIAGNOSIS — E782 Mixed hyperlipidemia: Secondary | ICD-10-CM | POA: Diagnosis not present

## 2016-01-21 DIAGNOSIS — J449 Chronic obstructive pulmonary disease, unspecified: Secondary | ICD-10-CM | POA: Diagnosis not present

## 2016-01-24 DIAGNOSIS — M533 Sacrococcygeal disorders, not elsewhere classified: Secondary | ICD-10-CM | POA: Diagnosis not present

## 2016-02-19 DIAGNOSIS — M533 Sacrococcygeal disorders, not elsewhere classified: Secondary | ICD-10-CM | POA: Diagnosis not present

## 2016-02-25 ENCOUNTER — Ambulatory Visit: Payer: Self-pay | Admitting: Physician Assistant

## 2016-03-03 DIAGNOSIS — Z720 Tobacco use: Secondary | ICD-10-CM | POA: Diagnosis not present

## 2016-03-03 DIAGNOSIS — J449 Chronic obstructive pulmonary disease, unspecified: Secondary | ICD-10-CM | POA: Diagnosis not present

## 2016-03-03 DIAGNOSIS — Z6820 Body mass index (BMI) 20.0-20.9, adult: Secondary | ICD-10-CM | POA: Diagnosis not present

## 2016-03-03 DIAGNOSIS — G894 Chronic pain syndrome: Secondary | ICD-10-CM | POA: Diagnosis not present

## 2016-03-14 ENCOUNTER — Encounter (HOSPITAL_COMMUNITY): Payer: Self-pay

## 2016-03-14 ENCOUNTER — Encounter (HOSPITAL_COMMUNITY)
Admission: RE | Admit: 2016-03-14 | Discharge: 2016-03-14 | Disposition: A | Discharge status: Home / Self Care | Payer: PPO | Source: Ambulatory Visit | Attending: Orthopedic Surgery | Admitting: Orthopedic Surgery

## 2016-03-14 DIAGNOSIS — Z01812 Encounter for preprocedural laboratory examination: Secondary | ICD-10-CM | POA: Diagnosis not present

## 2016-03-14 DIAGNOSIS — N390 Urinary tract infection, site not specified: Secondary | ICD-10-CM

## 2016-03-14 DIAGNOSIS — M533 Sacrococcygeal disorders, not elsewhere classified: Secondary | ICD-10-CM | POA: Diagnosis not present

## 2016-03-14 DIAGNOSIS — Z0183 Encounter for blood typing: Secondary | ICD-10-CM | POA: Insufficient documentation

## 2016-03-14 HISTORY — DX: Urinary tract infection, site not specified: N39.0

## 2016-03-14 LAB — BASIC METABOLIC PANEL
Anion gap: 12 (ref 5–15)
BUN: 11 mg/dL (ref 6–20)
CO2: 24 mmol/L (ref 22–32)
Calcium: 10 mg/dL (ref 8.9–10.3)
Chloride: 106 mmol/L (ref 101–111)
Creatinine, Ser: 0.71 mg/dL (ref 0.44–1.00)
GFR calc Af Amer: >60 mL/min (ref >60)
GFR calc non Af Amer: >60 mL/min (ref >60)
Glucose, Bld: 94 mg/dL (ref 65–99)
Potassium: 4.1 mmol/L (ref 3.5–5.1)
Sodium: 142 mmol/L (ref 135–145)

## 2016-03-14 LAB — CBC
HCT: 47.3 % — ABNORMAL HIGH (ref 36.0–46.0)
Hemoglobin: 15.7 g/dL — ABNORMAL HIGH (ref 12.0–15.0)
MCH: 31.3 pg (ref 26.0–34.0)
MCHC: 33.2 g/dL (ref 30.0–36.0)
MCV: 94.2 fL (ref 78.0–100.0)
Platelets: 208 10*3/uL (ref 150–400)
RBC: 5.02 MIL/uL (ref 3.87–5.11)
RDW: 13.2 % (ref 11.5–15.5)
WBC: 11.2 10*3/uL — ABNORMAL HIGH (ref 4.0–10.5)

## 2016-03-14 LAB — TYPE AND SCREEN
ABO/RH(D): O POS
Antibody Screen: NEGATIVE

## 2016-03-14 LAB — SURGICAL PCR SCREEN
MRSA, PCR: NEGATIVE
Staphylococcus aureus: NEGATIVE

## 2016-03-14 NOTE — Pre-Procedure Instructions (Addendum)
Pilar JarvisWanda H Neely  03/14/2016      [facility] - [place], [place] - [id number] [place] [id number] Phone: [phone] Fax: [phone]    Your procedure is scheduled on 03/19/16  [facility] at 530A.M.  Call this number if you have problems the morning of surgery:  [phone]   Remember:  Do not eat food or drink liquids after midnight.  Take these medicines the morning of surgery with A SIP OF WATER albuterol inhaler(Bring with you), xanax or valium if needed,gabapentin,hydrocodone if needed,skalaxin if needed  STOP all herbel meds, nsaids (aleve,naproxen,advil,ibuprofen) 5 days prior to surgery including vitamins, aspirin (stop today )   Do not wear jewelry, make-up or nail polish.  Do not wear lotions, powders, or perfumes, or deoderant.  Do not shave 48 hours prior to surgery.  Men may shave face and neck.  Do not bring valuables to the hospital.  [facility] is not responsible for any belongings or valuables.  Contacts, dentures or bridgework may not be worn into surgery.  Leave your suitcase in the car.  After surgery it may be brought to your room.  For patients admitted to the hospital, discharge time will be determined by your treatment team.  Patients discharged the day of surgery will not be allowed to drive home.   Name and phone number of your driver:    Special instructions:   Special Instructions: [place] - Preparing for Surgery  Before surgery, you can play an important role.  Because skin is not sterile, your skin needs to be as free of germs as possible.  You can reduce the number of germs on you skin by washing with CHG (chlorahexidine gluconate) soap before surgery.  CHG is an antiseptic cleaner which kills germs and bonds with the skin to continue killing germs even after washing.  Please DO NOT use if you have an allergy to CHG or antibacterial soaps.  If your skin becomes  reddened/irritated stop using the CHG and inform your nurse when you arrive at Short Stay.  Do not shave (including legs and underarms) for at least 48 hours prior to the first CHG shower.  You may shave your face.  Please follow these instructions carefully:   1.  Shower with CHG Soap the night before surgery and the morning of Surgery.  2.  If you choose to wash your hair, wash your hair first as usual with your normal shampoo.  3.  After you shampoo, rinse your hair and body thoroughly to remove the Shampoo.  4.  Use CHG as you would any other liquid soap.  You can apply chg directly  to the skin and wash gently with scrungie or a clean washcloth.  5.  Apply the CHG Soap to your body ONLY FROM THE NECK DOWN.  Do not use on open wounds or open sores.  Avoid contact with your eyes ears, mouth and genitals (private parts).  Wash genitals (private parts)       with your normal soap.  6.  Wash thoroughly, paying special attention to the area where your surgery will be performed.  7.  Thoroughly rinse your body with warm water from the neck down.  8.  DO NOT shower/wash with your normal soap after using and rinsing off the CHG Soap.  9.  Pat yourself dry with a clean towel.            10.  Wear clean pajamas.            11.  Place clean sheets on your bed the night of your first shower and do not sleep with pets.  Day of Surgery  Do not apply any lotions/deodorants the morning of surgery.  Please wear clean clothes to the hospital/surgery center.  Please read over the fact sheets that you were given.

## 2016-03-18 NOTE — Anesthesia Preprocedure Evaluation (Addendum)
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) MR  Rhythm:Regular Rate:Normal     Neuro/Psych  Headaches, Anxiety Depression Chronic back pain    GI/Hepatic Neg liver ROS, GERD  Controlled,  Endo/Other  negative endocrine ROS  Renal/GU negative Renal ROS     Musculoskeletal  (+) Arthritis ,   Abdominal   Peds  Hematology negative hematology ROS (+)   Anesthesia Other Findings   Reproductive/Obstetrics                           Anesthesia Physical Anesthesia Plan  ASA: III  Anesthesia Plan: General   Post-op Pain Management:    Induction: Intravenous  Airway Management Planned: Oral ETT  Additional Equipment:   Intra-op Plan:   Post-operative Plan: Extubation in OR  Informed Consent: I have reviewed the patients History and Physical, chart, labs and discussed the procedure including the risks, benefits and alternatives for the proposed anesthesia with the patient or authorized representative who has indicated his/her understanding and acceptance.     Plan Discussed with: CRNA and Surgeon  Anesthesia Plan Comments: (Plan routine monitors, GETA)        Anesthesia Quick Evaluation

## 2016-03-19 ENCOUNTER — Ambulatory Visit (HOSPITAL_COMMUNITY): Payer: PPO | Admitting: Anesthesiology

## 2016-03-19 ENCOUNTER — Encounter (HOSPITAL_COMMUNITY): Payer: Self-pay | Admitting: *Deleted

## 2016-03-19 ENCOUNTER — Ambulatory Visit (HOSPITAL_COMMUNITY): Payer: PPO

## 2016-03-19 ENCOUNTER — Encounter (HOSPITAL_COMMUNITY)
Admission: RE | Disposition: A | Discharge status: Home / Self Care | Payer: Self-pay | Source: Ambulatory Visit | Attending: Orthopedic Surgery

## 2016-03-19 ENCOUNTER — Observation Stay (HOSPITAL_COMMUNITY)
Admission: RE | Admit: 2016-03-19 | Discharge: 2016-03-20 | Disposition: A | Discharge status: Home / Self Care | Payer: PPO | Source: Ambulatory Visit | Attending: Orthopedic Surgery | Admitting: Orthopedic Surgery

## 2016-03-19 DIAGNOSIS — F418 Other specified anxiety disorders: Secondary | ICD-10-CM | POA: Insufficient documentation

## 2016-03-19 DIAGNOSIS — Z880 Allergy status to penicillin: Secondary | ICD-10-CM | POA: Diagnosis not present

## 2016-03-19 DIAGNOSIS — F329 Major depressive disorder, single episode, unspecified: Secondary | ICD-10-CM | POA: Insufficient documentation

## 2016-03-19 DIAGNOSIS — F1920 Other psychoactive substance dependence, uncomplicated: Secondary | ICD-10-CM | POA: Diagnosis not present

## 2016-03-19 DIAGNOSIS — G894 Chronic pain syndrome: Secondary | ICD-10-CM | POA: Diagnosis not present

## 2016-03-19 DIAGNOSIS — F419 Anxiety disorder, unspecified: Secondary | ICD-10-CM | POA: Insufficient documentation

## 2016-03-19 DIAGNOSIS — M533 Sacrococcygeal disorders, not elsewhere classified: Principal | ICD-10-CM | POA: Diagnosis present

## 2016-03-19 DIAGNOSIS — M1990 Unspecified osteoarthritis, unspecified site: Secondary | ICD-10-CM | POA: Diagnosis not present

## 2016-03-19 DIAGNOSIS — R262 Difficulty in walking, not elsewhere classified: Secondary | ICD-10-CM | POA: Insufficient documentation

## 2016-03-19 DIAGNOSIS — F429 Obsessive-compulsive disorder, unspecified: Secondary | ICD-10-CM | POA: Insufficient documentation

## 2016-03-19 DIAGNOSIS — J449 Chronic obstructive pulmonary disease, unspecified: Secondary | ICD-10-CM | POA: Insufficient documentation

## 2016-03-19 DIAGNOSIS — K219 Gastro-esophageal reflux disease without esophagitis: Secondary | ICD-10-CM | POA: Insufficient documentation

## 2016-03-19 DIAGNOSIS — F17210 Nicotine dependence, cigarettes, uncomplicated: Secondary | ICD-10-CM | POA: Insufficient documentation

## 2016-03-19 DIAGNOSIS — M545 Low back pain: Secondary | ICD-10-CM | POA: Diagnosis not present

## 2016-03-19 DIAGNOSIS — M549 Dorsalgia, unspecified: Secondary | ICD-10-CM | POA: Diagnosis not present

## 2016-03-19 DIAGNOSIS — Z419 Encounter for procedure for purposes other than remedying health state, unspecified: Secondary | ICD-10-CM

## 2016-03-19 DIAGNOSIS — M4327 Fusion of spine, lumbosacral region: Secondary | ICD-10-CM | POA: Diagnosis not present

## 2016-03-19 HISTORY — PX: SACROILIAC JOINT FUSION: SHX6088

## 2016-03-19 SURGERY — SACROILIAC JOINT FUSION
Anesthesia: General | Laterality: Right

## 2016-03-19 MED ORDER — HYDROMORPHONE HCL 1 MG/ML IJ SOLN
INTRAMUSCULAR | Status: AC
  Administered 2016-03-19: 0.5 mg via INTRAVENOUS
  Filled 2016-03-19: qty 1

## 2016-03-19 MED ORDER — MENTHOL 3 MG MT LOZG: 1.0000 | LOZENGE | OROMUCOSAL | Status: DC | PRN

## 2016-03-19 MED ORDER — HYDROCODONE-ACETAMINOPHEN 10-325 MG PO TABS: 1.0000 | ORAL_TABLET | Freq: Four times a day (QID) | ORAL | 0 refills | Status: DC | PRN

## 2016-03-19 MED ORDER — THROMBIN 20000 UNITS EX SOLR
CUTANEOUS | Status: AC
  Filled 2016-03-19: qty 20000

## 2016-03-19 MED ORDER — ONDANSETRON HCL 4 MG PO TABS: 4.0000 mg | ORAL_TABLET | Freq: Three times a day (TID) | ORAL | 0 refills | Status: DC | PRN

## 2016-03-19 MED ORDER — METHOCARBAMOL 500 MG PO TABS
500.0000 mg | ORAL_TABLET | Freq: Four times a day (QID) | ORAL | Status: DC | PRN
  Administered 2016-03-19 – 2016-03-20 (×3): 500 mg via ORAL
  Filled 2016-03-19 (×2): qty 1

## 2016-03-19 MED ORDER — EPINEPHRINE 0.3 MG/0.3ML IJ SOAJ: 0.3000 mg | Freq: Once | INTRAMUSCULAR | Status: DC

## 2016-03-19 MED ORDER — MEPERIDINE HCL 25 MG/ML IJ SOLN: 6.2500 mg | INTRAMUSCULAR | Status: DC | PRN

## 2016-03-19 MED ORDER — BUPIVACAINE LIPOSOME 1.3 % IJ SUSP
20.0000 mL | INTRAMUSCULAR | Status: DC
  Filled 2016-03-19: qty 20

## 2016-03-19 MED ORDER — PROMETHAZINE HCL 25 MG/ML IJ SOLN: 6.2500 mg | INTRAMUSCULAR | Status: DC | PRN

## 2016-03-19 MED ORDER — ALPRAZOLAM 0.5 MG PO TABS: 1.0000 mg | ORAL_TABLET | Freq: Three times a day (TID) | ORAL | Status: DC | PRN

## 2016-03-19 MED ORDER — PHENYLEPHRINE HCL 10 MG/ML IJ SOLN
INTRAMUSCULAR | Status: DC | PRN
  Administered 2016-03-19 (×2): 120 ug via INTRAVENOUS
  Administered 2016-03-19: 160 ug via INTRAVENOUS

## 2016-03-19 MED ORDER — PROPOFOL 10 MG/ML IV BOLUS
INTRAVENOUS | Status: AC
  Filled 2016-03-19: qty 20

## 2016-03-19 MED ORDER — GLYCOPYRROLATE 0.2 MG/ML IJ SOLN
INTRAMUSCULAR | Status: DC | PRN
  Administered 2016-03-19: 0.2 mg via INTRAVENOUS

## 2016-03-19 MED ORDER — ONDANSETRON HCL 4 MG/2ML IJ SOLN
INTRAMUSCULAR | Status: AC
  Filled 2016-03-19: qty 2

## 2016-03-19 MED ORDER — ACETAMINOPHEN 10 MG/ML IV SOLN
INTRAVENOUS | Status: AC
  Filled 2016-03-19: qty 100

## 2016-03-19 MED ORDER — EPHEDRINE SULFATE 50 MG/ML IJ SOLN
INTRAMUSCULAR | Status: DC | PRN
  Administered 2016-03-19: 15 mg via INTRAVENOUS

## 2016-03-19 MED ORDER — METHOCARBAMOL 1000 MG/10ML IJ SOLN
500.0000 mg | Freq: Four times a day (QID) | INTRAVENOUS | Status: DC | PRN
  Filled 2016-03-19: qty 5

## 2016-03-19 MED ORDER — FENTANYL CITRATE (PF) 100 MCG/2ML IJ SOLN
INTRAMUSCULAR | Status: DC | PRN
  Administered 2016-03-19 (×4): 50 ug via INTRAVENOUS

## 2016-03-19 MED ORDER — ACETAMINOPHEN 10 MG/ML IV SOLN
INTRAVENOUS | Status: DC | PRN
  Administered 2016-03-19: 1000 mg via INTRAVENOUS

## 2016-03-19 MED ORDER — ALBUTEROL SULFATE (2.5 MG/3ML) 0.083% IN NEBU: 3.0000 mL | INHALATION_SOLUTION | Freq: Four times a day (QID) | RESPIRATORY_TRACT | Status: DC | PRN

## 2016-03-19 MED ORDER — LIDOCAINE 2% (20 MG/ML) 5 ML SYRINGE
INTRAMUSCULAR | Status: AC
  Filled 2016-03-19: qty 5

## 2016-03-19 MED ORDER — MIDAZOLAM HCL 2 MG/2ML IJ SOLN
INTRAMUSCULAR | Status: DC | PRN
  Administered 2016-03-19: 2 mg via INTRAVENOUS

## 2016-03-19 MED ORDER — PHENYLEPHRINE HCL 10 MG/ML IJ SOLN
INTRAVENOUS | Status: DC | PRN
  Administered 2016-03-19: 50 ug/min via INTRAVENOUS

## 2016-03-19 MED ORDER — LACTATED RINGERS IV SOLN: INTRAVENOUS | Status: DC

## 2016-03-19 MED ORDER — FENTANYL CITRATE (PF) 100 MCG/2ML IJ SOLN
INTRAMUSCULAR | Status: AC
  Filled 2016-03-19: qty 2

## 2016-03-19 MED ORDER — BUPIVACAINE-EPINEPHRINE 0.25% -1:200000 IJ SOLN
INTRAMUSCULAR | Status: DC | PRN
  Administered 2016-03-19: 20 mL
  Administered 2016-03-19: 10 mL

## 2016-03-19 MED ORDER — SUGAMMADEX SODIUM 200 MG/2ML IV SOLN
INTRAVENOUS | Status: AC
  Filled 2016-03-19: qty 2

## 2016-03-19 MED ORDER — ROCURONIUM BROMIDE 100 MG/10ML IV SOLN
INTRAVENOUS | Status: DC | PRN
  Administered 2016-03-19: 10 mg via INTRAVENOUS
  Administered 2016-03-19: 50 mg via INTRAVENOUS

## 2016-03-19 MED ORDER — MIDAZOLAM HCL 2 MG/2ML IJ SOLN
INTRAMUSCULAR | Status: AC
  Filled 2016-03-19: qty 2

## 2016-03-19 MED ORDER — SODIUM CHLORIDE 0.9 % IV SOLN: 250.0000 mL | INTRAVENOUS | Status: DC

## 2016-03-19 MED ORDER — SODIUM CHLORIDE 0.9% FLUSH: 3.0000 mL | INTRAVENOUS | Status: DC | PRN

## 2016-03-19 MED ORDER — VANCOMYCIN HCL IN DEXTROSE 1-5 GM/200ML-% IV SOLN
1000.0000 mg | Freq: Once | INTRAVENOUS | Status: AC
  Administered 2016-03-19: 1000 mg via INTRAVENOUS
  Filled 2016-03-19: qty 200

## 2016-03-19 MED ORDER — ONDANSETRON HCL 4 MG/2ML IJ SOLN: 4.0000 mg | INTRAMUSCULAR | Status: DC | PRN

## 2016-03-19 MED ORDER — PHENYLEPHRINE 40 MCG/ML (10ML) SYRINGE FOR IV PUSH (FOR BLOOD PRESSURE SUPPORT)
PREFILLED_SYRINGE | INTRAVENOUS | Status: AC
  Filled 2016-03-19: qty 10

## 2016-03-19 MED ORDER — BUPIVACAINE-EPINEPHRINE (PF) 0.25% -1:200000 IJ SOLN
INTRAMUSCULAR | Status: AC
  Filled 2016-03-19: qty 30

## 2016-03-19 MED ORDER — DEXAMETHASONE 4 MG PO TABS
4.0000 mg | ORAL_TABLET | Freq: Four times a day (QID) | ORAL | Status: DC
  Administered 2016-03-19 – 2016-03-20 (×4): 4 mg via ORAL
  Filled 2016-03-19 (×4): qty 1

## 2016-03-19 MED ORDER — ONDANSETRON HCL 4 MG/2ML IJ SOLN
INTRAMUSCULAR | Status: DC | PRN
  Administered 2016-03-19: 4 mg via INTRAVENOUS

## 2016-03-19 MED ORDER — HYDROMORPHONE HCL 1 MG/ML IJ SOLN
0.2500 mg | INTRAMUSCULAR | Status: DC | PRN
  Administered 2016-03-19 (×4): 0.5 mg via INTRAVENOUS

## 2016-03-19 MED ORDER — SUGAMMADEX SODIUM 200 MG/2ML IV SOLN
INTRAVENOUS | Status: DC | PRN
  Administered 2016-03-19: 100 mg via INTRAVENOUS

## 2016-03-19 MED ORDER — METHOCARBAMOL 500 MG PO TABS
ORAL_TABLET | ORAL | Status: AC
Start: 2016-03-19 — End: 2016-03-19
  Filled 2016-03-19: qty 1

## 2016-03-19 MED ORDER — LACTATED RINGERS IV SOLN
INTRAVENOUS | Status: DC | PRN
  Administered 2016-03-19: 08:00:00 via INTRAVENOUS

## 2016-03-19 MED ORDER — 0.9 % SODIUM CHLORIDE (POUR BTL) OPTIME
TOPICAL | Status: DC | PRN
  Administered 2016-03-19: 1000 mL

## 2016-03-19 MED ORDER — DEXAMETHASONE SODIUM PHOSPHATE 4 MG/ML IJ SOLN: 4.0000 mg | Freq: Four times a day (QID) | INTRAMUSCULAR | Status: DC

## 2016-03-19 MED ORDER — BUPIVACAINE LIPOSOME 1.3 % IJ SUSP
INTRAMUSCULAR | Status: DC | PRN
  Administered 2016-03-19 (×2): 20 mL

## 2016-03-19 MED ORDER — HYDROCODONE-ACETAMINOPHEN 10-325 MG PO TABS
1.0000 | ORAL_TABLET | Freq: Four times a day (QID) | ORAL | Status: DC | PRN
  Administered 2016-03-19 – 2016-03-20 (×4): 2 via ORAL
  Filled 2016-03-19 (×4): qty 2

## 2016-03-19 MED ORDER — DEXAMETHASONE SODIUM PHOSPHATE 10 MG/ML IJ SOLN
INTRAMUSCULAR | Status: AC
  Filled 2016-03-19: qty 1

## 2016-03-19 MED ORDER — DIAZEPAM 10 MG PO TABS: 10.0000 mg | ORAL_TABLET | Freq: Three times a day (TID) | ORAL | 0 refills | Status: DC | PRN

## 2016-03-19 MED ORDER — EPHEDRINE 5 MG/ML INJ
INTRAVENOUS | Status: AC
  Filled 2016-03-19: qty 10

## 2016-03-19 MED ORDER — GLYCOPYRROLATE 0.2 MG/ML IV SOSY
PREFILLED_SYRINGE | INTRAVENOUS | Status: AC
  Filled 2016-03-19: qty 3

## 2016-03-19 MED ORDER — DEXAMETHASONE SODIUM PHOSPHATE 10 MG/ML IJ SOLN
INTRAMUSCULAR | Status: DC | PRN
  Administered 2016-03-19: 10 mg via INTRAVENOUS

## 2016-03-19 MED ORDER — GABAPENTIN 300 MG PO CAPS
300.0000 mg | ORAL_CAPSULE | Freq: Three times a day (TID) | ORAL | Status: DC
  Administered 2016-03-19 – 2016-03-20 (×3): 300 mg via ORAL
  Filled 2016-03-19 (×3): qty 1

## 2016-03-19 MED ORDER — SODIUM CHLORIDE 0.9% FLUSH
3.0000 mL | Freq: Two times a day (BID) | INTRAVENOUS | Status: DC
  Administered 2016-03-19: 3 mL via INTRAVENOUS

## 2016-03-19 MED ORDER — SUGAMMADEX SODIUM 500 MG/5ML IV SOLN
INTRAVENOUS | Status: AC
  Filled 2016-03-19: qty 5

## 2016-03-19 MED ORDER — MIDAZOLAM HCL 2 MG/2ML IJ SOLN: 0.5000 mg | Freq: Once | INTRAMUSCULAR | Status: DC | PRN

## 2016-03-19 MED ORDER — PROPOFOL 10 MG/ML IV BOLUS
INTRAVENOUS | Status: DC | PRN
  Administered 2016-03-19: 150 mg via INTRAVENOUS

## 2016-03-19 MED ORDER — LIDOCAINE HCL (CARDIAC) 20 MG/ML IV SOLN
INTRAVENOUS | Status: DC | PRN
  Administered 2016-03-19: 20 mg via INTRAVENOUS

## 2016-03-19 MED ORDER — ROCURONIUM BROMIDE 10 MG/ML (PF) SYRINGE
PREFILLED_SYRINGE | INTRAVENOUS | Status: AC
  Filled 2016-03-19: qty 10

## 2016-03-19 MED ORDER — DIAZEPAM 5 MG PO TABS
10.0000 mg | ORAL_TABLET | Freq: Three times a day (TID) | ORAL | Status: DC | PRN
Start: 2016-03-19 — End: 2016-03-20
  Administered 2016-03-19: 10 mg via ORAL
  Filled 2016-03-19: qty 2

## 2016-03-19 MED ORDER — PHENOL 1.4 % MT LIQD: 1.0000 | OROMUCOSAL | Status: DC | PRN

## 2016-03-19 MED ORDER — VANCOMYCIN HCL IN DEXTROSE 1-5 GM/200ML-% IV SOLN
1000.0000 mg | INTRAVENOUS | Status: AC
  Administered 2016-03-19: 1000 mg via INTRAVENOUS
  Filled 2016-03-19: qty 200

## 2016-03-19 SURGICAL SUPPLY — 58 items
BLADE SURG 11 STRL SS (BLADE) ×3 IMPLANT
BLADE SURG ROTATE 9660 (MISCELLANEOUS) IMPLANT
BRUSH SCRUB DISP (MISCELLANEOUS) ×2 IMPLANT
CLOSURE STERI-STRIP 1/2X4 (GAUZE/BANDAGES/DRESSINGS) ×1
CLOSURE WOUND 1/2 X4 (GAUZE/BANDAGES/DRESSINGS) ×1
CLSR STERI-STRIP ANTIMIC 1/2X4 (GAUZE/BANDAGES/DRESSINGS) ×2 IMPLANT
COVER SURGICAL LIGHT HANDLE (MISCELLANEOUS) ×3 IMPLANT
DRAPE C-ARM 42X72 X-RAY (DRAPES) ×3 IMPLANT
DRAPE C-ARMOR (DRAPES) ×3 IMPLANT
DRAPE SURG 17X23 STRL (DRAPES) ×3 IMPLANT
DRAPE U-SHAPE 47X51 STRL (DRAPES) ×3 IMPLANT
DRSG AQUACEL AG ADV 3.5X 6 (GAUZE/BANDAGES/DRESSINGS) ×3 IMPLANT
DURAPREP 26ML APPLICATOR (WOUND CARE) ×3 IMPLANT
ELECT BLADE 4.0 EZ CLEAN MEGAD (MISCELLANEOUS) ×3
ELECT PENCIL ROCKER SW 15FT (MISCELLANEOUS) ×3 IMPLANT
ELECT REM PT RETURN 9FT ADLT (ELECTROSURGICAL) ×3
ELECTRODE BLDE 4.0 EZ CLN MEGD (MISCELLANEOUS) ×1 IMPLANT
ELECTRODE REM PT RTRN 9FT ADLT (ELECTROSURGICAL) ×1 IMPLANT
Exchange Pin 500mm ×1 IMPLANT
GLOVE BIO SURGEON STRL SZ 6.5 (GLOVE) ×1 IMPLANT
GLOVE BIO SURGEONS STRL SZ 6.5 (GLOVE) ×1
GLOVE BIOGEL PI IND STRL 6.5 (GLOVE) IMPLANT
GLOVE BIOGEL PI IND STRL 8.5 (GLOVE) ×1 IMPLANT
GLOVE BIOGEL PI INDICATOR 6.5 (GLOVE) ×4
GLOVE BIOGEL PI INDICATOR 8.5 (GLOVE) ×2
GLOVE SS BIOGEL STRL SZ 8.5 (GLOVE) ×1 IMPLANT
GLOVE SUPERSENSE BIOGEL SZ 8.5 (GLOVE) ×2
GLOVE SURG SS PI 6.5 STRL IVOR (GLOVE) ×2 IMPLANT
GOWN STRL REUS W/ TWL LRG LVL3 (GOWN DISPOSABLE) ×1 IMPLANT
GOWN STRL REUS W/TWL 2XL LVL3 (GOWN DISPOSABLE) ×3 IMPLANT
GOWN STRL REUS W/TWL LRG LVL3 (GOWN DISPOSABLE) ×6
KIT BASIN OR (CUSTOM PROCEDURE TRAY) ×3 IMPLANT
KIT ROOM TURNOVER OR (KITS) ×3 IMPLANT
NDL SPNL 18GX3.5 QUINCKE PK (NEEDLE) IMPLANT
NEEDLE 22X1 1/2 (OR ONLY) (NEEDLE) ×3 IMPLANT
NEEDLE SPNL 18GX3.5 QUINCKE PK (NEEDLE) ×3 IMPLANT
NS IRRIG 1000ML POUR BTL (IV SOLUTION) ×3 IMPLANT
PACK LAMINECTOMY ORTHO (CUSTOM PROCEDURE TRAY) ×3 IMPLANT
PACK UNIVERSAL I (CUSTOM PROCEDURE TRAY) ×3 IMPLANT
PAD ARMBOARD 7.5X6 YLW CONV (MISCELLANEOUS) ×8 IMPLANT
PIN FIXATION EXCH 500MM (PIN) ×2 IMPLANT
PIN FIXATION THRD STEIN 300MM (PIN) ×6 IMPLANT
POSITIONER HEAD PRONE TRACH (MISCELLANEOUS) ×3 IMPLANT
PUTTY BONE DBX 5CC MIX (Putty) ×2 IMPLANT
SCREW DUAL THREADED 12.5X35 (Screw) ×2 IMPLANT
SCREW DUAL THREADED 12.5X45 (Screw) ×1 IMPLANT
STAPLER VISISTAT 35W (STAPLE) ×3 IMPLANT
STRIP CLOSURE SKIN 1/2X4 (GAUZE/BANDAGES/DRESSINGS) ×1 IMPLANT
SUT MNCRL AB 3-0 PS2 18 (SUTURE) ×2 IMPLANT
SUT VIC AB 1 CT1 18XCR BRD 8 (SUTURE) ×1 IMPLANT
SUT VIC AB 1 CT1 8-18 (SUTURE)
SUT VIC AB 2-0 CT1 18 (SUTURE) ×1 IMPLANT
SYR BULB IRRIGATION 50ML (SYRINGE) ×3 IMPLANT
SYR CONTROL 10ML LL (SYRINGE) ×3 IMPLANT
TOWEL OR 17X24 6PK STRL BLUE (TOWEL DISPOSABLE) ×3 IMPLANT
TOWEL OR 17X26 10 PK STRL BLUE (TOWEL DISPOSABLE) ×3 IMPLANT
WATER STERILE IRR 1000ML POUR (IV SOLUTION) ×1 IMPLANT
YANKAUER SUCT BULB TIP NO VENT (SUCTIONS) ×3 IMPLANT

## 2016-03-19 NOTE — Transfer of Care (Signed)
Immediate Anesthesia Transfer of Care Note  Patient: Pilar JarvisWanda H Consolo  Procedure(s) Performed: Procedure(s): SACROILIAC JOINT FUSION (Right)  Patient Location: PACU  Anesthesia Type:General  Level of Consciousness: awake, alert  and oriented  Airway & Oxygen Therapy: Patient Spontanous Breathing and Patient connected to nasal cannula oxygen  Post-op Assessment: Report given to RN, Patient moving all extremities X 4 and Patient able to stick tongue midline  Post vital signs: Reviewed and stable  Last Vitals:  Vitals:   03/19/16 0631  BP: (!) 131/55  Pulse: 67  Resp: 20  Temp: 36.6 C    Last Pain:  Vitals:   03/19/16 0631  TempSrc: Oral  PainSc:       Patients Stated Pain Goal: 2 (03/19/16 [id number])  Complications: No apparent anesthesia complications

## 2016-03-19 NOTE — Discharge Instructions (Signed)
Keep dressing clean and dry Do not remove dressing Call for fever, chills, drainage, or dressing problems Start aspirin on Friday - 1 tablet per day Do not put weight on right leg

## 2016-03-19 NOTE — Anesthesia Procedure Notes (Signed)
Procedure Name: Intubation Date/Time: 03/19/2016 8:33 AM Performed by: De NurseENNIE, Keturah Yerby E Pre-anesthesia Checklist: Patient identified, Emergency Drugs available, Suction available, Patient being monitored and Timeout performed Patient Re-evaluated:Patient Re-evaluated prior to inductionOxygen Delivery Method: Circle system utilized Preoxygenation: Pre-oxygenation with 100% oxygen Intubation Type: IV induction Ventilation: Mask ventilation without difficulty Laryngoscope Size: Mac and 3 Grade View: Grade I Tube type: Oral Tube size: 7.0 mm Number of attempts: 1 Airway Equipment and Method: Stylet Placement Confirmation: ETT inserted through vocal cords under direct vision,  positive ETCO2 and breath sounds checked- equal and bilateral Secured at: 1 cm Tube secured with: Tape Dental Injury: Teeth and Oropharynx as per pre-operative assessment

## 2016-03-19 NOTE — Evaluation (Signed)
Occupational Therapy Evaluation and Discharge Patient Details Name: Elizabeth Fowler MRN: [id number] DOB: 06-14-1951 Today's Date: 03/19/2016    History of Present Illness patient is a 65 yo female s/p SI joint fusion.   Clinical Impression   Pt reports she was independent with ADL PTA. Currently pt overall supervision for safety with ADL and functional mobility with the exception of min assist for LB ADL. Pt able to maintain RLE NWB status throughout all functional activities. All back, safety, and ADL education completed with pt; she verbalized understanding as has no further questions or concerns for OT at this time. Pt planning to d/c home with near 24/7 supervision from family. No further acute OT needs identified; signing off at this time. Please re-consult if needs change. Thank you for this referral.    Follow Up Recommendations  No OT follow up;Supervision - Intermittent    Equipment Recommendations  None recommended by OT    Recommendations for Other Services       Precautions / Restrictions Precautions Precautions: Back Precaution Booklet Issued: No (PT already provided handout) Precaution Comments: Pt able to recall 2/3 back precautions. Reviewed all precautions with pt. Restrictions Weight Bearing Restrictions: Yes RLE Weight Bearing: Non weight bearing      Mobility Bed Mobility Overal bed mobility: Needs Assistance Bed Mobility: Rolling;Sit to Sidelying Rolling: Supervision       Sit to sidelying: Supervision General bed mobility comments: Cues for not reaching backward to twist during rolling but pt able to perfrom without physical assist.  Transfers Overall transfer level: Needs assistance Equipment used: Rolling walker (2 wheeled) Transfers: Sit to/from Stand Sit to Stand: Supervision         General transfer comment: Supervision for safety. Pt with good hand placement and technique.    Balance Overall balance assessment: Needs  assistance;History of Falls Sitting-balance support: Feet supported;No upper extremity supported Sitting balance-Leahy Scale: Good     Standing balance support: Bilateral upper extremity supported Standing balance-Leahy Scale: Poor Standing balance comment: RW to maintain standing balance                            ADL Overall ADL's : Needs assistance/impaired Eating/Feeding: Modified independent;Sitting   Grooming: Supervision/safety;Standing Grooming Details (indicate cue type and reason): Educated pt on use of 2 cups for oral care. Upper Body Bathing: Set up;Supervision/ safety;Sitting   Lower Body Bathing: Minimal assistance;Sit to/from stand   Upper Body Dressing : Set up;Supervision/safety;Sitting   Lower Body Dressing: Minimal assistance;Sit to/from stand Lower Body Dressing Details (indicate cue type and reason): Educated pt on compensatory strategies for LB ADL or use of AE; pt verbalized understanding. Pt reports husband can assist with LB ADL as needed. Toilet Transfer: Supervision/safety;Ambulation;BSC;RW Toilet Transfer Details (indicate cue type and reason): Simulated by sit <> stand from EOB and functional mobility in room.  Toileting- Clothing Manipulation and Hygiene: Supervision/safety;Sit to/from stand Toileting - Clothing Manipulation Details (indicate cue type and reason): Pt able to reach bottom with lateral lean and no twisting; no need for AE.   Tub/Shower Transfer Details (indicate cue type and reason): Pt reports she plans to sponge bathe initially until she can weight bear through LLE. Does not plan to manage tub transfer at this time. Functional mobility during ADLs: Supervision/safety;Rolling walker General ADL Comments: Pt able to demo maintaining NWB status during functional activities. Educated pt on maintaining back precautions during functional activities, log roll technique for bed mobility.  Vision Vision Assessment?: No apparent  visual deficits   Perception     Praxis      Pertinent Vitals/Pain Pain Assessment: Faces  Faces Pain Scale: Hurts little more Pain Location: back Pain Descriptors / Indicators: Sore Pain Intervention(s): Monitored during session;Repositioned;Ice applied     Hand Dominance Right   Extremity/Trunk Assessment Upper Extremity Assessment Upper Extremity Assessment: Overall WFL for tasks assessed   Lower Extremity Assessment Lower Extremity Assessment: Defer to PT evaluation RLE: Unable to fully assess due to pain   Cervical / Trunk Assessment Cervical / Trunk Assessment: Kyphotic   Communication Communication Communication: No difficulties   Cognition Arousal/Alertness: Awake/alert Behavior During Therapy: WFL for tasks assessed/performed Overall Cognitive Status: Within Functional Limits for tasks assessed                     General Comments       Exercises       Shoulder Instructions      Home Living Family/patient expects to be discharged to:: Private residence Living Arrangements: Spouse/significant other Available Help at Discharge: Family;Available 24 hours/day Type of Home: House Home Access: Stairs to enter Entergy CorporationEntrance Stairs-Number of Steps: 4 Entrance Stairs-Rails: Right Home Layout: One level     Bathroom Shower/Tub: Chief Strategy OfficerTub/shower unit   Bathroom Toilet: Standard     Home Equipment: Environmental consultantWalker - 2 wheels;Bedside commode;Shower seat;Wheelchair - Civil engineer, contractingmanual;Adaptive equipment Adaptive Equipment: Reacher        Prior Functioning/Environment Level of Independence: Independent             OT Diagnosis: Acute pain;Generalized weakness   OT Problem List:     OT Treatment/Interventions:      OT Goals(Current goals can be found in the care plan section) Acute Rehab OT Goals Patient Stated Goal: to go home OT Goal Formulation: All assessment and education complete, DC therapy  OT Frequency:     Barriers to D/C:            Co-evaluation               End of Session Equipment Utilized During Treatment: Gait belt;Rolling walker Nurse Communication: Mobility status  Activity Tolerance: Patient tolerated treatment well Patient left: in bed;with call bell/phone within reach;with SCD's reapplied   Time: 1520-1530 OT Time Calculation (min): 10 min Charges:  OT General Charges $OT Visit: 1 Procedure OT Evaluation $OT Eval Moderate Complexity: 1 Procedure G-Codes: OT G-codes **NOT FOR INPATIENT CLASS** Functional Assessment Tool Used: Clinical judgement Functional Limitation: Self care Self Care Current Status ([id number]): At least 1 percent but less than 20 percent impaired, limited or restricted Self Care Goal Status ([id number](G8988): At least 1 percent but less than 20 percent impaired, limited or restricted Self Care Discharge Status [phone]): At least 1 percent but less than 20 percent impaired, limited or restricted   Gaye AlkenBailey A Ivet Guerrieri M.S., OTR/L Pager: [phone]  03/19/2016, 3:57 PM

## 2016-03-19 NOTE — Anesthesia Postprocedure Evaluation (Signed)
Anesthesia Post Note  Patient: Pilar JarvisWanda H Tobey  Procedure(s) Performed: Procedure(s) (LRB): SACROILIAC JOINT FUSION (Right)  Patient location during evaluation: PACU Anesthesia Type: General Level of consciousness: awake and alert, patient cooperative and oriented Pain management: pain level controlled Vital Signs Assessment: post-procedure vital signs reviewed and stable Respiratory status: spontaneous breathing, nonlabored ventilation, respiratory function stable and patient connected to nasal cannula oxygen Cardiovascular status: blood pressure returned to baseline and stable Postop Assessment: no signs of nausea or vomiting Anesthetic complications: no    Last Vitals:  Vitals:   03/19/16 1100 03/19/16 1150  BP:  (!) 132/58  Pulse: 80 83  Resp: 12 18  Temp: 36.2 C 36.4 C    Last Pain:  Vitals:   03/19/16 1130  TempSrc:   PainSc: 7                  Rajah Tagliaferro,E. Lataysha Vohra

## 2016-03-19 NOTE — Progress Notes (Signed)
Pharmacy Antibiotic Note  Elizabeth Fowler is a 65 y.o. female admitted on 03/19/2016 with sacroiliac joint fusion.  Pharmacy has been consulted for IV Vancomycin dosing x1 dose post op for surgical prophylaxis. Vancomycin pre-op dose of 1gm IV given today @ 08:25.  Preop labs on 03/14/16: SCr=0.71   CrCl ~ 55 ml/min WBC 11.2k. Currently afebrile.  No drain noted  Plan: Vancomycin 1gm IV x1 tonight at 22:00 Pharmacy will sign off  Weight: 115 lb (52.2 kg)  Temp (24hrs), Avg:97.5 F (36.4 C), Min:97.2 F (36.2 C), Max:97.9 F (36.6 C)   Recent Labs Lab 03/14/16 1107  WBC 11.2*  CREATININE 0.71    Estimated Creatinine Clearance: 55 mL/min (by C-G formula based on SCr of 0.8 mg/dL).    Allergies  Allergen Reactions  . Bee Venom Anaphylaxis  . Codeine Anaphylaxis and Swelling  . Penicillins Anaphylaxis, Swelling and Other (See Comments)    Facial swelling    Antimicrobials this admission: 8/30 Vanc x1 post op   Microbiology results: No cultures ordered  Thank you for allowing pharmacy to be a part of this patient's care.   Noah Delaineuth Latifa Noble, RPh Clinical Pharmacist Pager: [phone]  03/19/2016 11:49 AM

## 2016-03-19 NOTE — H&P (Signed)
History of Present Illness The patient is a 65 year old female who comes in today for a preoperative History and Physical. The patient is scheduled for a right SI joint fusion to be performed by Dr. Debria Garret D. Shon Baton, MD at [facility] on 03-19-16 . Please see the hospital record for complete dictated history and physical. Pt has COPD. Pt says she is trying to quit but still smoking less than a pack a day.  Additional reasons for visit:  Transition into care is described as the following: The patient is transitioning into care and a summary of care was reviewed.   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 mother and sister Rheumatoid Arthritis  mother, sister, grandmother mothers side and grandmother fathers side Heart disease in female family member before age 78  Drug / Alcohol Addiction  mother Congestive Heart Failure  grandfather mothers side Heart disease in female family member before age 58  Heart Disease  mother, grandmother mothers side and grandmother fathers side  Social History  Exercise  Exercises never Alcohol use  former drinker Children   2 Tobacco use  Current some day smoker. current some days smoker; smoke(d) less than 1/2 pack(s) per day Drug/Alcohol Rehab (Currently)  no Drug/Alcohol Rehab (Previously)  no Living situation  live with spouse Marital status  married Tobacco / smoke exposure  yes outdoors only Current work status  disabled Pain Contract  no Illicit drug use  no  Medication History ProAir HFA (108 (90 Base)MCG/ACT Aerosol Soln, Inhalation) Active. (prn) ALPRAZolam (1MG  Tablet, Oral as needed) Active. Skelaxin (800MG  Tablet, Oral) Active. Hydrocodone-Acetaminophen (10-325MG  Tablet, Oral) Active. Gabapentin (300MG  Tablet, Oral) Active. EpiPen (0.3 MG/0.3ML(1:1000) Device, Intramuscular) Active. Medications Reconciled  Past Surgical History  Appendectomy  Carpal Tunnel Repair  left Colon Polyp Removal - Colonoscopy  Gallbladder Surgery  open Hysterectomy  complete (non-cancerous)  Other Problems Migraine Headache  Osteoarthritis  Osteoporosis  High blood pressure  Anxiety Disorder  Chronic Pain  Depression   Vitals  03/14/2016 7:48 AM Weight: 116 lb Height: 61.5in Body Surface Area: 1.51 m Body Mass Index: 21.56 kg/m  Temp.: 98.70F(Oral)  Pulse: 81 (Regular)  BP: 140/67 (Sitting, Left Arm, Standard)  General General Appearance-Not in acute distress. Orientation-Oriented X3. Build & Nutrition-Well nourished and Well developed.  Integumentary General Characteristics Surgical Scars - surgical scarring consistent with previous lumbar surgery and anterior neck surgical scarring consistent with previous cervical surgery. Lumbar Spine-Skin examination of the lumbar spine is without deformity, skin lesions, lacerations or abrasions.  Chest and Lung Exam Auscultation Breath sounds - Normal and Clear.  Cardiovascular Auscultation Rhythm - Regular rate and rhythm.  Abdomen Palpation/Percussion Palpation and Percussion of the abdomen  reveal - Soft, Non Tender and No Rebound tenderness.  Peripheral Vascular Lower Extremity Palpation - Posterior tibial pulse - Bilateral - 2+. Dorsalis pedis  pulse - Bilateral - 2+.  Neurologic Sensation Lower Extremity - Right - sensation is diminished in the lower extremity. Reflexes Patellar Reflex - Bilateral - 2+. Achilles Reflex - Bilateral - 2+. Clonus - Bilateral - clonus not present. Hoffman's Sign - Bilateral - Hoffman's sign not present. Testing Seated Straight Leg Raise - Right - Seated straight leg raise positive for gluteal pain.  Musculoskeletal Spine/Ribs/Pelvis  Lumbosacral Spine: Inspection and Palpation - Tenderness - right lumbar paraspinals tender to palpation and right sacroiliac jointl tender to palpation. Positive FABER test Strength and Tone: Strength - Hip Flexion - Left - 5/5. Right -5/5. Knee Extension - Left - 5/5. Right -5/5. Knee Flexion - Left - 5/5. Right - 5/5. Ankle Dorsiflexion - Left - 5/5. Right -5/5. Ankle Plantarflexion - Left - 5/5. Right - 5/5. Heel walk - Bilateral - unable to heel walk. Toe Walk - Bilateral - unable to walk on toes. Heel-Toe Walk - Bilateral - able to heel-toe walk with difficulty. ROM - Flexion - moderately decreased range of motion and painful. Extension - moderately decreased range of motion and painful. Left Lateral Bending - moderately decreased range of motion and painful. Right Lateral Bending - moderately decreased range of motion and painful. Right Rotation - moderately decreased range of motion and painful. Left Rotation - moderately decreased range of motion and painful. Pain - . Lumbosacral Spine - Waddell's Signs - no Waddell's signs present. Lower Extremity Range of Motion - No true hip, knee or ankle pain with range of motion. Gait and Station - [employer]ive Devices - no assistive devices.  Sacroiliac joint pain Current Plans Goal Of Surgery: Discussed that goal of surgery is to reduce pain and improve function and  quality of life. Patient is aware that despite all appropriate treatment that there pain and function could be the same, worse, or different.  Plans Transcription At this point in time, the patient has a successful diagnostic SI injection trial. Given the greater than 80% improvement, the localization of pain to the SI joint, and the positive clinical findings, I think it is reasonable to move forward with an SI fusion. We have discussed this in great detail. The risks include infection, bleeding, nerve damage, death, stroke, paralysis, hardware complications with the biggest thing I am worried about is if it does not fuse. I have again reiterated how the smoking will definitely lead to her not fusing and she states she will this time really stop smoking. She is getting clearance from Dr. Sherwood GamblerFusco, her PCP and I will make sure to forward a copy of this note to him. Once, we have the clearance and the insurance approval, we will move forward with surgery in a timely fashion. Because of her underlying COPD, I have concerns about moving forward with surgery at the outpatient surgical center for fear that perhaps she will have a pulmonary issue that would require longer intubation so to be on the safe side, I would prefer to do the procedure at the hospital.

## 2016-03-19 NOTE — Evaluation (Signed)
Physical Therapy Evaluation Patient Details Name: Elizabeth JarvisWanda H Fowler MRN: [id number] DOB: February 03, 1951 Today's Date: 03/19/2016   History of Present Illness  patient is a 65 yo female s/p SI joint fusion.  Clinical Impression  Patient seen for mobility assessment and education s/p spinal surgery. Patient demonstrates good overall mobility at this time with compliance on NWBing RLE. Patient receptive to education, simulated stair negotiation. Patient reports will have necessary level of assist. No futher acute PT needs, will sign off. -DW    Follow Up Recommendations Supervision/Assistance - 24 hour;Supervision for mobility/OOB    Equipment Recommendations  None recommended by PT    Recommendations for Other Services       Precautions / Restrictions Precautions Precautions: Back Precaution Booklet Issued: Yes (comment) Precaution Comments: verbally reviewed with patient Restrictions Weight Bearing Restrictions: Yes RLE Weight Bearing: Non weight bearing      Mobility  Bed Mobility Overal bed mobility: Modified Independent             General bed mobility comments: able to perform without assist or cues, states shes been doing it this way for years since previous surgeries  Transfers Overall transfer level: Needs assistance Equipment used: Rolling walker (2 wheeled) Transfers: Sit to/from Stand Sit to Stand: Supervision         General transfer comment: VCs for hand placement with use of RW, performed from bed and from [facility] over toilet  Ambulation/Gait Ambulation/Gait assistance: Supervision Ambulation Distance (Feet): 75 Feet Assistive device: Rolling walker (2 wheeled) Gait Pattern/deviations: Step-to pattern Gait velocity: decreased Gait velocity interpretation: Below normal speed for age/gender General Gait Details: hopping technique to maintain Non-weight bearing, VCs for positioning within RW and cues for relaxation of UEs  Stairs Stairs:  (educated on  technique/simulated performance of 3 Editor, commissioningmusketeer)          Engineer, drillingWheelchair Mobility    Modified Rankin (Stroke Patients Only)       Balance Overall balance assessment: History of Falls                                           Pertinent Vitals/Pain Pain Assessment: 0-10 Pain Score: 5  Pain Location: low back right leg Pain Descriptors / Indicators: Sore Pain Intervention(s): Monitored during session;Premedicated before session    Home Living Family/patient expects to be discharged to:: Private residence Living Arrangements: Spouse/significant other Available Help at Discharge: Family;Available 24 hours/day Type of Home: House Home Access: Stairs to enter Entrance Stairs-Rails: Right Entrance Stairs-Number of Steps: 4 Home Layout: One level Home Equipment: Walker - 2 wheels;Bedside commode;Shower seat;Wheelchair - manual      Prior Function Level of Independence: Independent               Hand Dominance   Dominant Hand: Right    Extremity/Trunk Assessment   Upper Extremity Assessment: Overall WFL for tasks assessed           Lower Extremity Assessment: Overall WFL for tasks assessed;RLE deficits/detail      Cervical / Trunk Assessment: Kyphotic  Communication   Communication: No difficulties  Cognition Arousal/Alertness: Awake/alert Behavior During Therapy: WFL for tasks assessed/performed Overall Cognitive Status: Within Functional Limits for tasks assessed                      General Comments      Exercises  Assessment/Plan    PT Assessment Patent does not need any further PT services  PT Diagnosis Difficulty walking;Abnormality of gait;Acute pain   PT Problem List    PT Treatment Interventions     PT Goals (Current goals can be found in the Care Plan section) Acute Rehab PT Goals Patient Stated Goal: to go home PT Goal Formulation: With patient Time For Goal Achievement: 04/02/16 Potential to Achieve  Goals: Good    Frequency     Barriers to discharge        Co-evaluation               End of Session Equipment Utilized During Treatment: Gait belt Activity Tolerance: Patient tolerated treatment well Patient left: Other (comment) (with OT therapist) Nurse Communication: Mobility status    Functional Assessment Tool Used: clinical judgement Functional Limitation: Mobility: Walking and moving around Mobility: Walking and Moving Around Current Status (Z6109): At least 1 percent but less than 20 percent impaired, limited or restricted Mobility: Walking and Moving Around Goal Status [phone]): At least 1 percent but less than 20 percent impaired, limited or restricted Mobility: Walking and Moving Around Discharge Status [phone]): At least 1 percent but less than 20 percent impaired, limited or restricted    Time: 9147-8295 PT Time Calculation (min) (ACUTE ONLY): 16 min   Charges:   PT Evaluation $PT Eval Low Complexity: 1 Procedure     PT G Codes:   PT G-Codes **NOT FOR INPATIENT CLASS** Functional Assessment Tool Used: clinical judgement Functional Limitation: Mobility: Walking and moving around Mobility: Walking and Moving Around Current Status (A2130): At least 1 percent but less than 20 percent impaired, limited or restricted Mobility: Walking and Moving Around Goal Status [phone]): At least 1 percent but less than 20 percent impaired, limited or restricted Mobility: Walking and Moving Around Discharge Status [phone]): At least 1 percent but less than 20 percent impaired, limited or restricted    Elizabeth Fowler 03/19/2016, 3:33 PM Elizabeth Fowler, PT DPT  [phone]

## 2016-03-19 NOTE — Brief Op Note (Signed)
03/19/2016  10:01 AM  PATIENT:  Elizabeth Fowler  65 y.o. female  PRE-OPERATIVE DIAGNOSIS:  RIGHT SACROILIAC  DYSFUNCTION  POST-OPERATIVE DIAGNOSIS:  RIGHT SACROILIAC  DYSFUNCTION  PROCEDURE:  Procedure(s): SACROILIAC JOINT FUSION (Right)  SURGEON:  Surgeon(s) and Role:    * Venita Lickahari Tiffanny Lamarche, MD - Primary  PHYSICIAN ASSISTANT:   ASSISTANTS: Carmen Mayo   ANESTHESIA:   general  EBL:  Total I/O In: 500 [I.V.:500] Out: 50 [Blood:50]  BLOOD ADMINISTERED:none  DRAINS: none   LOCAL MEDICATIONS USED:  MARCAINE    and OTHER enalipril  SPECIMEN:  No Specimen  DISPOSITION OF SPECIMEN:  N/A  COUNTS:  YES  TOURNIQUET:  * No tourniquets in log *  DICTATION: .Other Dictation: Dictation Number [id number]  PLAN OF CARE: Admit for overnight observation  PATIENT DISPOSITION:  PACU - hemodynamically stable.

## 2016-03-19 NOTE — Op Note (Signed)
NAMReece Packer:  Fowler, Elizabeth                 ACCOUNT NO.:  [id number]  MEDICAL RECORD NO.:  [id number]  LOCATION:  [facility]                         FACILITY:  [facility]  PHYSICIAN:  Ildefonso Keaney D. Shon BatonBrooks, [name]. DATE OF BIRTH:  May 28, 1951  DATE OF PROCEDURE:  03/19/2016 DATE OF DISCHARGE:                              OPERATIVE REPORT   PREOPERATIVE DIAGNOSIS:  Right sacroiliac joint dysfunction and pain.  POSTOPERATIVE DIAGNOSIS:  Right sacroiliac joint dysfunction and pain.  PROCEDURE:  Right sacroiliac joint fusion.  IMPLANT USED:  SI fusion device, 45-mm length screw and a 35-mm length screw.  SURGEON:  Ramsey Midgett D. Shon BatonBrooks, [name].  FIRST ASSISTANT:  Red Levelarmen Mayo, [place].  COMPLICATIONS:  No complications.  HISTORY:  This is a very pleasant, 65 year old who had a previous ALIF at L5-S1 and has been having over the last several months severe right gluteal buttock pain.  Attempts at conservative management have failed too.  The patient had significant improvement with an SI injection and her clinical exam was consistent with SI joint pain.  As a result of the failure of conservative management and the need for better pain control, we elected to proceed with surgery.  All risks, benefits and alternatives were discussed with the patient and consent was obtained.  OPERATIVE NOTE:  The patient was brought to the operating room and placed supine on the operating room table.  After successful induction of general anesthesia and endotracheal intubation, TEDs and SCDs were applied and she was turned prone onto a chest and pelvic roll.  All bony prominences were well padded, and the right SI joint was prepped and draped in a standard fashion.  Time-out was taken to confirm the patient, procedure, and all other pertinent important data.  Once this was done, in a lateral plane I had marked out the borders of the SI joint itself.  Percutaneous pin was advanced through a small stab incision down to the lateral aspect of  the pelvis.  I confirmed satisfactory position in the AP and the lateral inlet and outlet view. I advanced a guidepin through the pelvis across the SI joint and into the sacrum.  Care was taken to remain lateral to the neural foramen. Also care was taken to remain in the midportion of the sacrum.  Once I had this pin properly positioned, I then placed a second pin.  Because of her small stature, I did not think a third screw would be possible. Given this, I elected to only use 2 larger Herbert screws.  I then made an incision connecting these 2 guide pins.  I dissected down and then drilled over the guidepin taps and then used my curette to decorticate and debride the SI joint itself.  Once this was done at both levels, I then measured and placed the appropriate size screw.  Each screw was packed with the bone from the tap and drill as well as DBX mix.  Both screws had excellent purchase and were properly seated lateral to the foramen.  Final x-rays were satisfactory in all 3 planes.  The wound was then copiously irrigated with normal saline and I injected with enalapril for postoperative analgesia.  The wound was  then closed in a layered fashion with #1 Vicryl suture, 2-0 Vicryl suture and a 3-0 Monocryl.  Steri-Strips and dry dressing were applied, and the patient was transferred to the PACU without incident.  At the end of the case, all needle and sponge counts were correct.     Kainan Patty D. Shon Baton, [name].     DDB/MEDQ  D:  03/19/2016  T:  03/19/2016  Job:  [id number]  cc:   Dr. Shon Baton' office

## 2016-03-20 DIAGNOSIS — M533 Sacrococcygeal disorders, not elsewhere classified: Secondary | ICD-10-CM | POA: Diagnosis not present

## 2016-03-20 NOTE — Progress Notes (Signed)
Patient ID: Elizabeth Fowler, female   DOB: [birth year], 65 y.o.   MRN: [id number]    Subjective: 1 Day Post-Op Procedure(s) (LRB): SACROILIAC JOINT FUSION (Right) Patient reports pain as 6 on 0-10 scale.   Denies CP or SOB.  Voiding without difficulty. Pt had BM.  Pt has been using rolling walker to ambulate hall. NWB RLE Objective: Vital signs in last 24 hours: Temp:  [97.2 F (36.2 C)-98.7 F (37.1 C)] 98.5 F (36.9 C) (08/31 0410) Pulse Rate:  [72-109] 76 (08/31 0410) Resp:  [7-24] 18 (08/31 0410) BP: (101-132)/(46-76) 118/56 (08/31 0410) SpO2:  [92 %-99 %] 97 % (08/31 0410)  Intake/Output from previous day: 08/30 0701 - 08/31 0700 In: 1280 [P.O.:480; I.V.:800] Out: 50 [Blood:50] Intake/Output this shift: No intake/output data recorded.  Labs: No results for input(s): HGB in the last 72 hours. No results for input(s): WBC, RBC, HCT, PLT in the last 72 hours. No results for input(s): NA, K, CL, CO2, BUN, CREATININE, GLUCOSE, CALCIUM in the last 72 hours. No results for input(s): LABPT, INR in the last 72 hours.  Physical Exam: Neurologically intact ABD soft Sensation intact distally Dorsiflexion/Plantar flexion intact Incision: no drainage Compartment soft  Assessment/Plan: 1 Day Post-Op Procedure(s) (LRB): SACROILIAC JOINT FUSION (Right) Pt has already been cleared by PT to go home Pt has home equipment - bath chair, WC, walker, toilet chair, etc Pt may DC home today Pain medication and nausea medication provided Pt gets Benzos from local PCP - no muscle relaxers given  Mayo, Baxter Kailarmen Christina for Dr. Venita Lickahari [name] [facility] [phone] 03/20/2016, 7:40 AM

## 2016-03-20 NOTE — Progress Notes (Signed)
Pt. discharged home accompanied by husband. Prescriptions and discharge instructions given with verbalization of understanding. Incision site on Rt. hip with no s/s of infection - no swelling, redness, bleeding, and/or drainage noted. Opportunity given to ask questions but no question asked. Pt. transported out of this unit in wheelchair by the volunteer

## 2016-03-21 ENCOUNTER — Encounter (HOSPITAL_COMMUNITY): Payer: Self-pay | Admitting: Orthopedic Surgery

## 2016-03-26 NOTE — Discharge Summary (Signed)
Patient ID: Elizabeth Fowler MRN: [id number] DOB/AGE: May 16, 1951 65 y.o.  Admit date: 03/19/2016 Discharge date: 03/26/2016  Admission Diagnoses:  Active Problems:   Sacroiliac dysfunction   Discharge Diagnoses:  Active Problems:   Sacroiliac dysfunction  status post Procedure(s): SACROILIAC JOINT FUSION  Past Medical History:  Diagnosis Date  . Acid reflux    occ  . Anxiety   . Arthritis    "right hip" (08/31/2013)  . Chronic bronchitis (HCC)    "got it q yr til pneumonia shot given" (08/31/2013)  . Chronic lower back pain   . Complication of anesthesia    pt. states a difficult time waking up-last 2 surgeries  . COPD (chronic obstructive pulmonary disease) (HCC)   . Depression   . Heart murmur    10 or more yrs ago echo neg unable to remember where  . Migraines    "goes to bed dark room; last migraine yrs ago hx  . MVP (mitral valve prolapse)    mild anterior leafleat MVP with mild MR 02/26/04 echo  . OCD (obsessive compulsive disorder)   . Pneumonia    "I've had pneumonia probably 30 times" (08/31/2013)  . Shingles 2016  . UTI (lower urinary tract infection) 03/14/2016   starting rx today    Surgeries: Procedure(s): SACROILIAC JOINT FUSION on 03/19/2016   Consultants:   Discharged Condition: Improved  Hospital Course: Elizabeth Fowler is an 65 y.o. female who was admitted 03/19/2016 for operative treatment of SI dysfunction. Patient failed conservative treatments (please see the history and physical for the specifics) and had severe unremitting pain that affects sleep, daily activities and work/hobbies. After pre-op clearance, the patient was taken to the operating room on 03/19/2016 and underwent  Procedure(s): SACROILIAC JOINT FUSION.  Urinating w/o difficulty. Pt ambulating with walker.  Cleared by PT for discharge.Pain controlled on oral meds.   Patient was given perioperative antibiotics:  Anti-infectives    Start     Dose/Rate Route Frequency Ordered Stop   03/19/16 2200  vancomycin (VANCOCIN) IVPB 1000 mg/200 mL premix     1,000 mg 200 mL/hr over 60 Minutes Intravenous  Once 03/19/16 1158 03/19/16 2212   03/19/16 0600  vancomycin (VANCOCIN) IVPB 1000 mg/200 mL premix     1,000 mg 200 mL/hr over 60 Minutes Intravenous 60 min pre-op 03/19/16 0600 03/19/16 0925       Patient was given sequential compression devices and early ambulation to prevent DVT.   Patient benefited maximally from hospital stay and there were no complications. At the time of discharge, the patient was urinating/moving their bowels without difficulty, tolerating a regular diet, pain is controlled with oral pain medications and they have been cleared by PT/OT.   Recent vital signs: No data found.    Recent laboratory studies: No results for input(s): WBC, HGB, HCT, PLT, NA, K, CL, CO2, BUN, CREATININE, GLUCOSE, INR, CALCIUM in the last 72 hours.  Invalid input(s): PT, 2   Discharge Medications:     Medication List    STOP taking these medications   ALPRAZolam 1 MG tablet Commonly known as:  XANAX   HYDROmorphone 4 MG tablet Commonly known as:  DILAUDID   metaxalone 800 MG tablet Commonly known as:  SKELAXIN   methocarbamol 500 MG tablet Commonly known as:  ROBAXIN     TAKE these medications   albuterol 108 (90 Base) MCG/ACT inhaler Commonly known as:  PROVENTIL HFA;VENTOLIN HFA Inhale 2 puffs into the lungs every 6 (six) hours  as needed for wheezing or shortness of breath.   ASPERCREME MAX ROLL-ON 16 % Liqd Generic drug:  Menthol (Topical Analgesic) Apply 1 application topically as needed (for pain).   diazepam 10 MG tablet Commonly known as:  VALIUM Take 10 mg by mouth 3 (three) times daily as needed for anxiety. What changed:  Another medication with the same name was added. Make sure you understand how and when to take each.   diazepam 10 MG tablet Commonly known as:  VALIUM Take 1 tablet (10 mg total) by mouth every 8 (eight) hours as needed  (muscle spasms). What changed:  You were already taking a medication with the same name, and this prescription was added. Make sure you understand how and when to take each.   EPIPEN 2-PAK 0.3 mg/0.3 mL Soaj injection Generic drug:  EPINEPHrine Inject 0.3 mg into the muscle once.   gabapentin 300 MG capsule Commonly known as:  NEURONTIN Take 300 mg by mouth 3 (three) times daily.   HYDROcodone-acetaminophen 10-325 MG tablet Commonly known as:  NORCO Take 1-2 tablets by mouth every 6 (six) hours as needed for moderate pain. What changed:  Another medication with the same name was added. Make sure you understand how and when to take each.   HYDROcodone-acetaminophen 10-325 MG tablet Commonly known as:  NORCO Take 1 tablet by mouth every 6 (six) hours as needed. What changed:  You were already taking a medication with the same name, and this prescription was added. Make sure you understand how and when to take each.   ondansetron 4 MG tablet Commonly known as:  ZOFRAN Take 1 tablet (4 mg total) by mouth every 8 (eight) hours as needed for nausea or vomiting.       Diagnostic Studies: Dg Si Joints  Result Date: 03/19/2016 CLINICAL DATA:  RIGHT SI joint fusion EXAM: DG C-ARM 61-120 MIN; BILATERAL SACROILIAC JOINTS - 3+ VIEW COMPARISON:  None recent ; correlation lumbar spine radiographs 09/01/2013 FLUOROSCOPY TIME:  2 minutes 53 seconds Images obtained:  3 FINDINGS: Two large hollow screws are identified across the RIGHT SI joint. Diffuse osseous demineralization. Prior anterior fusion L5-S1 with disc prosthesis noted. IMPRESSION: Orthopedic hardware traversing the RIGHT SI joint. Electronically Signed   By: Ulyses Southward [name].   On: 03/19/2016 11:42   Dg C-arm 1-60 Min  Result Date: 03/19/2016 CLINICAL DATA:  RIGHT SI joint fusion EXAM: DG C-ARM 61-120 MIN; BILATERAL SACROILIAC JOINTS - 3+ VIEW COMPARISON:  None recent ; correlation lumbar spine radiographs 09/01/2013 FLUOROSCOPY TIME:  2  minutes 53 seconds Images obtained:  3 FINDINGS: Two large hollow screws are identified across the RIGHT SI joint. Diffuse osseous demineralization. Prior anterior fusion L5-S1 with disc prosthesis noted. IMPRESSION: Orthopedic hardware traversing the RIGHT SI joint. Electronically Signed   By: Ulyses Southward [name].   On: 03/19/2016 11:42      Follow-up Information    Alvy Beal, MD. Schedule an appointment as soon as possible for a visit today.   Specialty:  Orthopedic Surgery Why:  If symptoms worsen, For suture removal, For wound re-check Contact information: [address] [place] [address] [phone]           Discharge Plan:  discharge to home.  NWB RLE for 6 weeks.  Pt will present on clinic in 2 weeks.  Post op medication provided.  Home equipment in place.   Disposition:     Signed: MayoBaxter Kail for Dr. Venita Lick [facility] (  [phone] 03/26/2016, 8:10 AM

## 2016-04-04 DIAGNOSIS — Z4789 Encounter for other orthopedic aftercare: Secondary | ICD-10-CM | POA: Diagnosis not present

## 2016-05-05 DIAGNOSIS — M533 Sacrococcygeal disorders, not elsewhere classified: Secondary | ICD-10-CM | POA: Diagnosis not present

## 2016-06-17 DIAGNOSIS — Z6820 Body mass index (BMI) 20.0-20.9, adult: Secondary | ICD-10-CM | POA: Diagnosis not present

## 2016-06-17 DIAGNOSIS — Z23 Encounter for immunization: Secondary | ICD-10-CM | POA: Diagnosis not present

## 2016-06-17 DIAGNOSIS — G894 Chronic pain syndrome: Secondary | ICD-10-CM | POA: Diagnosis not present

## 2016-06-17 DIAGNOSIS — F419 Anxiety disorder, unspecified: Secondary | ICD-10-CM | POA: Diagnosis not present

## 2016-06-17 DIAGNOSIS — Z1389 Encounter for screening for other disorder: Secondary | ICD-10-CM | POA: Diagnosis not present

## 2016-09-04 DIAGNOSIS — G894 Chronic pain syndrome: Secondary | ICD-10-CM | POA: Diagnosis not present

## 2016-09-04 DIAGNOSIS — Z1389 Encounter for screening for other disorder: Secondary | ICD-10-CM | POA: Diagnosis not present

## 2016-09-04 DIAGNOSIS — F419 Anxiety disorder, unspecified: Secondary | ICD-10-CM | POA: Diagnosis not present

## 2016-09-04 DIAGNOSIS — Z6820 Body mass index (BMI) 20.0-20.9, adult: Secondary | ICD-10-CM | POA: Diagnosis not present

## 2016-09-04 DIAGNOSIS — J449 Chronic obstructive pulmonary disease, unspecified: Secondary | ICD-10-CM | POA: Diagnosis not present

## 2016-09-22 ENCOUNTER — Emergency Department (HOSPITAL_COMMUNITY): Payer: PPO

## 2016-09-22 ENCOUNTER — Encounter (HOSPITAL_COMMUNITY): Payer: Self-pay

## 2016-09-22 ENCOUNTER — Emergency Department (HOSPITAL_COMMUNITY)
Admission: EM | Admit: 2016-09-22 | Discharge: 2016-09-23 | Disposition: A | Discharge status: Home / Self Care | Payer: PPO | Attending: Emergency Medicine | Admitting: Emergency Medicine

## 2016-09-22 DIAGNOSIS — K219 Gastro-esophageal reflux disease without esophagitis: Secondary | ICD-10-CM | POA: Insufficient documentation

## 2016-09-22 DIAGNOSIS — F17210 Nicotine dependence, cigarettes, uncomplicated: Secondary | ICD-10-CM | POA: Insufficient documentation

## 2016-09-22 DIAGNOSIS — J449 Chronic obstructive pulmonary disease, unspecified: Secondary | ICD-10-CM | POA: Diagnosis not present

## 2016-09-22 DIAGNOSIS — R0602 Shortness of breath: Secondary | ICD-10-CM | POA: Diagnosis not present

## 2016-09-22 DIAGNOSIS — R0789 Other chest pain: Secondary | ICD-10-CM | POA: Diagnosis not present

## 2016-09-22 DIAGNOSIS — Z79899 Other long term (current) drug therapy: Secondary | ICD-10-CM | POA: Insufficient documentation

## 2016-09-22 DIAGNOSIS — R079 Chest pain, unspecified: Secondary | ICD-10-CM | POA: Diagnosis not present

## 2016-09-22 LAB — BASIC METABOLIC PANEL
Anion gap: 13 (ref 5–15)
BUN: 14 mg/dL (ref 6–20)
CO2: 24 mmol/L (ref 22–32)
Calcium: 10.2 mg/dL (ref 8.9–10.3)
Chloride: 101 mmol/L (ref 101–111)
Creatinine, Ser: 0.55 mg/dL (ref 0.44–1.00)
GFR calc Af Amer: >60 mL/min (ref >60)
GFR calc non Af Amer: >60 mL/min (ref >60)
Glucose, Bld: 89 mg/dL (ref 65–99)
Potassium: 3.7 mmol/L (ref 3.5–5.1)
Sodium: 138 mmol/L (ref 135–145)

## 2016-09-22 LAB — CBC
HCT: 44.6 % (ref 36.0–46.0)
Hemoglobin: 15.5 g/dL — ABNORMAL HIGH (ref 12.0–15.0)
MCH: 31.8 pg (ref 26.0–34.0)
MCHC: 34.8 g/dL (ref 30.0–36.0)
MCV: 91.6 fL (ref 78.0–100.0)
Platelets: 256 10*3/uL (ref 150–400)
RBC: 4.87 MIL/uL (ref 3.87–5.11)
RDW: 12.8 % (ref 11.5–15.5)
WBC: 12.2 10*3/uL — ABNORMAL HIGH (ref 4.0–10.5)

## 2016-09-22 LAB — TROPONIN I: Troponin I: <0.03 ng/mL (ref <0.03)

## 2016-09-22 NOTE — ED Provider Notes (Signed)
[facility]-EMERGENCY DEPT Provider Note   CSN: [id number] Arrival date & time: 09/22/16  1914 By signing my name below, I, Levon Hedger, attest that this documentation has been prepared under the direction and in the presence of Devoria Albe, MD . Electronically Signed: Levon Hedger, Scribe. 09/23/2016. 12:22 AM.   Time seen 12:11 AM  History   Chief Complaint Chief Complaint  Patient presents with  . Chest Pain   HPI NESIAH JUMP is a 66 y.o. female with a history of acid reflux who presents to the Emergency Department complaining of intermittent right sided chest pain onset two weeks ago. Per pt, the pain lasts for 3 minutes at a time before resolving; she reports having 5 episodes of this pain today. The right sided pain is tender if she touches her chest. She also describes  burning chest pain with radiation to her throat with acid fluid and right shoulder pain. Pt states the pain is unrelated to position, exacerbated by coffee and spicy foods and is alleviated by drinking milk or taking her husbands "purple pill". She has taken 3 Zantac in the last 24 hours with significant relief of pain, last taken at 4 this PM tonight. She currently takes hydrocodone for  pain medications taken PTA for chronic pain in her neck and pain after surgery. Pt reports associated acid reflux and SOB secondary to pain. Pt is a current 4 cigarette/ day smoker. Pt reports that less than one year ago, she was smoking 1.5 packs per day. She is a retired Engineer, structural. She describes strong family cardiac history and states, "everyone in my family on both sides has died of a heart attack". Pt states she currently has a referral to a cardiologist that was made today. She denies any nausea, vomiting, abdominal pain, or any other associated symptoms. She denies taking ASA or NSAID's.  She states she normally drinks 1 pot of coffee a day.  She reports she started getting the pain during our interview.   The history is provided by the  patient. No language interpreter was used.   Past Medical History:  Diagnosis Date  . Acid reflux    occ  . Anxiety   . Arthritis    "right hip" (08/31/2013)  . Chronic bronchitis (HCC)    "got it q yr til pneumonia shot given" (08/31/2013)  . Chronic lower back pain   . Complication of anesthesia    pt. states a difficult time waking up-last 2 surgeries  . COPD (chronic obstructive pulmonary disease) (HCC)   . Depression   . Heart murmur    10 or more yrs ago echo neg unable to remember where  . Migraines    "goes to bed dark room; last migraine yrs ago hx  . MVP (mitral valve prolapse)    mild anterior leafleat MVP with mild MR 02/26/04 echo  . OCD (obsessive compulsive disorder)   . Pneumonia    "I've had pneumonia probably 30 times" (08/31/2013)  . Shingles 2016  . UTI (lower urinary tract infection) 03/14/2016   starting rx today    Patient Active Problem List   Diagnosis Date Noted  . Sacroiliac dysfunction 03/19/2016  . Neck pain 07/06/2014  . Back pain 08/31/2013  . Degeneration of intervertebral disc, site unspecified 08/16/2013  . Stiffness of joints, not elsewhere classified, multiple sites 03/16/2013  . Weakness of both legs 03/16/2013  . Difficulty in walking(719.7) 03/16/2013  . COPD 04/10/2009  . CONSTIPATION, CHRONIC 04/10/2009  . ARTHRITIS 04/10/2009  .  BACK PAIN, LUMBOSACRAL, CHRONIC 04/10/2009  . WEIGHT LOSS, ABNORMAL 04/10/2009  . OTHER DYSPHAGIA 04/10/2009    Past Surgical History:  Procedure Laterality Date  . ABDOMINAL EXPOSURE N/A 08/31/2013   Procedure: ABDOMINAL EXPOSURE;  Surgeon: Larina Earthlyodd F Early, MD;  [facility];  Service: Vascular;  Laterality: N/A;  . ABDOMINAL HYSTERECTOMY  05/1976  . ANTERIOR CERVICAL DECOMP/DISCECTOMY FUSION N/A 07/06/2014   Procedure: ANTERIOR CERVICAL DECOMPRESSION/DISCECTOMY FUSION 1 LEVEL;  Surgeon: Venita Lickahari [name], MD;  Location: [facility] OR;  Service: Orthopedics;  Laterality: N/A;  . ANTERIOR LUMBAR FUSION N/A  08/31/2013   Procedure: ALIF L5 - S1 1 LEVEL;  Surgeon: Venita Lickahari [name], MD;  Location: [facility] OR;  Service: Orthopedics;  Laterality: N/A;  . APPENDECTOMY  1970's  . CARPAL TUNNEL RELEASE Left   . CHOLECYSTECTOMY  1970's  . COLONOSCOPY    . DILATION AND CURETTAGE OF UTERUS  12/1969  . erethra      ureter not urethra  . ESOPHAGOGASTRODUODENOSCOPY    . KIDNEY SURGERY  05/1976   S/P hysterectomy; cut ureter; had to repair kidney/bladder; thought they removed my kidney for years; records were destroyed; developed problems; told insides looked like mush; cleaned out the infection and repaired leaking bladder I think" (08/31/2013)  . MULTIPLE EXTRACTIONS WITH ALVEOLOPLASTY Bilateral 04/27/2015   Procedure: EXTRACTIONS WITH BILATERAL TORI, MAX (R) TORI;  Surgeon: Ocie DoyneScott Jensen, DDS;  Location: [facility] OR;  Service: Oral Surgery;  Laterality: Bilateral;  . SACROILIAC JOINT FUSION Right 03/19/2016   Procedure: SACROILIAC JOINT FUSION;  Surgeon: Venita Lickahari [name], MD;  [facility];  Service: Orthopedics;  Laterality: Right;  . WRIST SURGERY Right 1995    OB History    No data available       Home Medications    Prior to Admission medications   Medication Sig Start Date End Date Taking? Authorizing Provider  albuterol (PROVENTIL HFA;VENTOLIN HFA) 108 (90 BASE) MCG/ACT inhaler Inhale 2 puffs into the lungs every 6 (six) hours as needed for wheezing or shortness of breath.    Historical Provider, MD  diazepam (VALIUM) 10 MG tablet Take 10 mg by mouth 3 (three) times daily as needed for anxiety.     Historical Provider, MD  diazepam (VALIUM) 10 MG tablet Take 1 tablet (10 mg total) by mouth every 8 (eight) hours as needed (muscle spasms). 03/19/16   Venita Lickahari [name], MD  EPINEPHrine (EPIPEN 2-PAK) 0.3 mg/0.3 mL IJ SOAJ injection Inject 0.3 mg into the muscle once.    Historical Provider, MD  gabapentin (NEURONTIN) 300 MG capsule Take 300 mg by mouth 3 (three) times daily.     Historical Provider, MD    HYDROcodone-acetaminophen (NORCO) 10-325 MG tablet Take 1-2 tablets by mouth every 6 (six) hours as needed for moderate pain.    Historical Provider, MD  HYDROcodone-acetaminophen (NORCO) 10-325 MG tablet Take 1 tablet by mouth every 6 (six) hours as needed. 03/19/16   Venita Lickahari [name], MD  Menthol, Topical Analgesic, (ASPERCREME MAX ROLL-ON) 16 % LIQD Apply 1 application topically as needed (for pain).    Historical Provider, MD  omeprazole (PRILOSEC) 20 MG capsule Take 1 po BID x 2 weeks then once a day 09/23/16   Devoria AlbeIva Laiyah Exline, MD  ondansetron (ZOFRAN) 4 MG tablet Take 1 tablet (4 mg total) by mouth every 8 (eight) hours as needed for nausea or vomiting. 03/19/16   Venita Lickahari [name], MD    Family History Family History  Problem Relation Age of Onset  . Heart disease Mother   .  Hypertension Mother     Social History Social History  Substance Use Topics  . Smoking status: Current Every Day Smoker    Packs/day: 0.50    Years: 50.00    Types: Cigarettes  . Smokeless tobacco: Never Used  . Alcohol use No     Comment: 08/31/2013 "drank a little years ago; last drink was probably in the 1990's"  lives at home Lives with spouse Smokes 4 cigs a day down from 1 1/2 ppd Retired on disabililty.    Allergies   Bee venom; Codeine; and Penicillins   Review of Systems Review of Systems  All other systems reviewed and are negative.  10 systems reviewed and all are negative for acute change except as noted in the HPI.  Physical Exam Updated Vital Signs BP 120/67 (BP Location: Left Arm)   Pulse 72   Temp 97.8 F (36.6 C) (Oral)   Resp 16   Ht 5' 1.5" (1.562 m)   Wt 116 lb (52.6 kg)   SpO2 97%   BMI 21.56 kg/m   Vital signs normal    Physical Exam  Constitutional: She is oriented to person, place, and time. She appears well-developed and well-nourished.  Non-toxic appearance. She does not appear ill. No distress.  Looks older.  HENT:  Head: Normocephalic and atraumatic.  Right Ear:  External ear normal.  Left Ear: External ear normal.  Nose: Nose normal. No mucosal edema or rhinorrhea.  Mouth/Throat: Oropharynx is clear and moist and mucous membranes are normal. No dental abscesses or uvula swelling.  Eyes: Conjunctivae and EOM are normal. Pupils are equal, round, and reactive to light.  Pinpoint pupils  Neck: Normal range of motion and full passive range of motion without pain. Neck supple.  Cardiovascular: Normal rate and regular rhythm.  Exam reveals no gallop and no friction rub.   Murmur heard. She has a short, late systolic murmur with a loud S2.   Pulmonary/Chest: Effort normal and breath sounds normal. No respiratory distress. She has no wheezes. She has no rhonchi. She has no rales. She exhibits tenderness. She exhibits no crepitus.    Abdominal: Soft. Normal appearance and bowel sounds are normal. She exhibits no distension. There is no tenderness. There is no rebound and no guarding.  Musculoskeletal: Normal range of motion. She exhibits no edema or tenderness.  Neurological: She is alert and oriented to person, place, and time. She has normal strength. No cranial nerve deficit.  Skin: Skin is warm, dry and intact. No rash noted. No erythema. No pallor.  Psychiatric: She has a normal mood and affect. Her speech is normal and behavior is normal. Her mood appears not anxious.  Nursing note and vitals reviewed.  ED Treatments / Results  DIAGNOSTIC STUDIES:  Oxygen Saturation is 94% on RA, low by my interpretation.     Labs (all labs ordered are listed, but only abnormal results are displayed) Results for orders placed or performed during the hospital encounter of 09/22/16  Basic metabolic panel  Result Value Ref Range   Sodium 138 135 - 145 mmol/L   Potassium 3.7 3.5 - 5.1 mmol/L   Chloride 101 101 - 111 mmol/L   CO2 24 22 - 32 mmol/L   Glucose, Bld 89 65 - 99 mg/dL   BUN 14 6 - 20 mg/dL   Creatinine, Ser 1.61 0.44 - 1.00 mg/dL   Calcium 09.6 8.9 -  04.5 mg/dL   GFR calc non Af Amer >60 >60 mL/min   GFR  calc Af Amer >60 >60 mL/min   Anion gap 13 5 - 15  CBC  Result Value Ref Range   WBC 12.2 (H) 4.0 - 10.5 K/uL   RBC 4.87 3.87 - 5.11 MIL/uL   Hemoglobin 15.5 (H) 12.0 - 15.0 g/dL   HCT 16.1 09.6 - 04.5 %   MCV 91.6 78.0 - 100.0 fL   MCH 31.8 26.0 - 34.0 pg   MCHC 34.8 30.0 - 36.0 g/dL   RDW 40.9 81.1 - 91.4 %   Platelets 256 150 - 400 K/uL  Troponin I  Result Value Ref Range   Troponin I <0.03 <0.03 ng/mL  I-stat troponin, ED  Result Value Ref Range   Troponin i, poc 0.00 0.00 - 0.08 ng/mL   Comment 3           Laboratory interpretation all normal except mild leukocytosis    EKG  EKG Interpretation  Date/Time:  Monday September 22 2016 19:22:06 EST Ventricular Rate:  88 PR Interval:  142 QRS Duration: 94 QT Interval:  358 QTC Calculation: 433 R Axis:   72 Text Interpretation:  Normal sinus rhythm Right atrial enlargement Nonspecific ST abnormality SINCE LAST TRACING HEART RATE HAS INCREASED 22 Mar 2015 Confirmed by Madelynn Malson  MD-I, Saphronia Ozdemir ([id number]) on 09/23/2016 12:08:58 AM       Radiology Dg Chest 2 View  Result Date: 09/22/2016 CLINICAL DATA:  66 year old female with right side chest pain for 2 weeks radiating to the neck shoulder blade and back. Dizziness shortness of breath and nausea. Initial encounter. EXAM: CHEST  2 VIEW COMPARISON:  [facility] Chest radiographs 08/23/2013 FINDINGS: Stable lung volumes at the upper limits of normal to mildly hyperinflated. Calcified aortic atherosclerosis. Small calcified posterior right hilar lymph node or granuloma is unchanged. Other mediastinal contours are within normal limits. Visualized tracheal air column is within normal limits. Stable mild apical scarring. No pneumothorax, pulmonary edema, pleural effusion or confluent pulmonary opacity. Partially visible cervical ACDF hardware. No acute osseous abnormality identified. Negative visible bowel gas pattern. IMPRESSION: No acute  cardiopulmonary abnormality. Electronically Signed   By: Odessa Fleming [name].   On: 09/22/2016 19:43    Procedures Procedures (including critical care time)  Medications Ordered in ED Medications  gi cocktail (Maalox,Lidocaine,Donnatal) (30 mLs Oral Given 09/23/16 0028)  famotidine (PEPCID) tablet 20 mg (20 mg Oral Given 09/23/16 0027)     Initial Impression / Assessment and Plan / ED Course  I have reviewed the triage vital signs and the nursing notes.  Pertinent labs & imaging results that were available during my care of the patient were reviewed by me and considered in my medical decision making (see chart for details).  COORDINATION OF CARE:  12:23 AM Discussed treatment plan with pt at bedside and pt agreed to plan. We discussed her test results. A delta troponin i-STAT was done. Patient was given a GI cocktail and Pepcid for her complaints of symptoms that sound like GERD. She already has a referral to a cardiologist. We discussed things to do to help with her reflux symptoms.  Pt states the GI cocktail and pepcid helped.   Final Clinical Impressions(s) / ED Diagnoses   Final diagnoses:  Gastroesophageal reflux disease, esophagitis presence not specified  Atypical chest pain    New Prescriptions New Prescriptions   OMEPRAZOLE (PRILOSEC) 20 MG CAPSULE    Take 1 po BID x 2 weeks then once a day    Plan discharge  Devoria Albe, MD, Armando Gang  I personally performed the services described in this documentation, which was scribed in my presence. The recorded information has been reviewed and considered.  Devoria Albe, MD, Concha Pyo, MD 09/23/16 0600

## 2016-09-22 NOTE — ED Triage Notes (Signed)
Patient states that she has been having chest pain on the right side, radiating into her neck, right shoulder blade, and back.  Also complaining of dizziness, shorten ess of breath, and nausea.

## 2016-09-23 LAB — I-STAT TROPONIN, ED: Troponin i, poc: 0 ng/mL (ref 0.00–0.08)

## 2016-09-23 MED ORDER — FAMOTIDINE 20 MG PO TABS
20.0000 mg | ORAL_TABLET | Freq: Once | ORAL | Status: AC
  Administered 2016-09-23: 20 mg via ORAL
  Filled 2016-09-23: qty 1

## 2016-09-23 MED ORDER — OMEPRAZOLE 20 MG PO CPDR: DELAYED_RELEASE_CAPSULE | ORAL | 0 refills | Status: DC

## 2016-09-23 MED ORDER — GI COCKTAIL ~~LOC~~
30.0000 mL | Freq: Once | ORAL | Status: AC
  Administered 2016-09-23: 30 mL via ORAL
  Filled 2016-09-23: qty 30

## 2016-09-23 NOTE — ED Notes (Signed)
Pt alert & oriented x4, stable gait. Patient given discharge instructions, paperwork & prescription(s). Patient  instructed to stop at the registration desk to finish any additional paperwork. Patient verbalized understanding. Pt left department w/ no further questions. 

## 2016-09-23 NOTE — ED Notes (Signed)
Pt say CP for the past 2 weeks. Today she became more SOB and her heart rate increased to 110 bmp. Reports pain is mid chest. Pt does say she has had a lot more reflux.

## 2016-09-23 NOTE — Discharge Instructions (Addendum)
Look at the information about GERD. Take the prilosec as prescribed. Consider seeing a gastroenterologist, you may need to have endoscopy to make sure you aren't developing esophagitis or an ulcer. Follow up with the cardiologist as you have already discussed with your doctor.

## 2016-09-24 ENCOUNTER — Telehealth: Payer: Self-pay

## 2016-09-24 NOTE — Telephone Encounter (Signed)
Sent notes to scheduling 

## 2016-10-02 ENCOUNTER — Telehealth: Payer: Self-pay | Admitting: Cardiology

## 2016-10-02 NOTE — Telephone Encounter (Signed)
[facility]16 with Dr Herbie BaltimoreHarding. Records put with Dr Elissa HeftyHarding's schedule on 10/28/16. lp

## 2016-10-28 ENCOUNTER — Ambulatory Visit: Payer: PPO | Admitting: Cardiology

## 2016-12-03 DIAGNOSIS — E782 Mixed hyperlipidemia: Secondary | ICD-10-CM | POA: Diagnosis not present

## 2016-12-03 DIAGNOSIS — L821 Other seborrheic keratosis: Secondary | ICD-10-CM | POA: Diagnosis not present

## 2016-12-03 DIAGNOSIS — J449 Chronic obstructive pulmonary disease, unspecified: Secondary | ICD-10-CM | POA: Diagnosis not present

## 2016-12-03 DIAGNOSIS — Z1389 Encounter for screening for other disorder: Secondary | ICD-10-CM | POA: Diagnosis not present

## 2016-12-03 DIAGNOSIS — Z6820 Body mass index (BMI) 20.0-20.9, adult: Secondary | ICD-10-CM | POA: Diagnosis not present

## 2016-12-03 DIAGNOSIS — M47816 Spondylosis without myelopathy or radiculopathy, lumbar region: Secondary | ICD-10-CM | POA: Diagnosis not present

## 2016-12-03 DIAGNOSIS — R252 Cramp and spasm: Secondary | ICD-10-CM | POA: Diagnosis not present

## 2016-12-03 DIAGNOSIS — K219 Gastro-esophageal reflux disease without esophagitis: Secondary | ICD-10-CM | POA: Diagnosis not present

## 2016-12-03 DIAGNOSIS — F419 Anxiety disorder, unspecified: Secondary | ICD-10-CM | POA: Diagnosis not present

## 2016-12-03 DIAGNOSIS — M47812 Spondylosis without myelopathy or radiculopathy, cervical region: Secondary | ICD-10-CM | POA: Diagnosis not present

## 2016-12-03 DIAGNOSIS — G894 Chronic pain syndrome: Secondary | ICD-10-CM | POA: Diagnosis not present

## 2016-12-03 DIAGNOSIS — L0390 Cellulitis, unspecified: Secondary | ICD-10-CM | POA: Diagnosis not present

## 2016-12-03 DIAGNOSIS — L720 Epidermal cyst: Secondary | ICD-10-CM | POA: Diagnosis not present

## 2017-01-22 ENCOUNTER — Inpatient Hospital Stay (HOSPITAL_COMMUNITY)
Admission: EM | Admit: 2017-01-22 | Discharge: 2017-01-24 | DRG: 392 | Disposition: A | Discharge status: Home / Self Care | Payer: PPO | Attending: Internal Medicine | Admitting: Internal Medicine

## 2017-01-22 ENCOUNTER — Encounter (HOSPITAL_COMMUNITY): Payer: Self-pay | Admitting: *Deleted

## 2017-01-22 ENCOUNTER — Emergency Department (HOSPITAL_COMMUNITY): Payer: PPO

## 2017-01-22 DIAGNOSIS — K922 Gastrointestinal hemorrhage, unspecified: Secondary | ICD-10-CM | POA: Diagnosis not present

## 2017-01-22 DIAGNOSIS — Z66 Do not resuscitate: Secondary | ICD-10-CM | POA: Diagnosis not present

## 2017-01-22 DIAGNOSIS — E7800 Pure hypercholesterolemia, unspecified: Secondary | ICD-10-CM | POA: Diagnosis present

## 2017-01-22 DIAGNOSIS — K921 Melena: Secondary | ICD-10-CM | POA: Diagnosis not present

## 2017-01-22 DIAGNOSIS — Z880 Allergy status to penicillin: Secondary | ICD-10-CM

## 2017-01-22 DIAGNOSIS — J449 Chronic obstructive pulmonary disease, unspecified: Secondary | ICD-10-CM | POA: Diagnosis present

## 2017-01-22 DIAGNOSIS — Z8719 Personal history of other diseases of the digestive system: Secondary | ICD-10-CM

## 2017-01-22 DIAGNOSIS — Z720 Tobacco use: Secondary | ICD-10-CM

## 2017-01-22 DIAGNOSIS — G8929 Other chronic pain: Secondary | ICD-10-CM | POA: Diagnosis present

## 2017-01-22 DIAGNOSIS — F419 Anxiety disorder, unspecified: Secondary | ICD-10-CM | POA: Diagnosis present

## 2017-01-22 DIAGNOSIS — E876 Hypokalemia: Secondary | ICD-10-CM | POA: Diagnosis not present

## 2017-01-22 DIAGNOSIS — M1990 Unspecified osteoarthritis, unspecified site: Secondary | ICD-10-CM | POA: Diagnosis present

## 2017-01-22 DIAGNOSIS — F17210 Nicotine dependence, cigarettes, uncomplicated: Secondary | ICD-10-CM | POA: Diagnosis present

## 2017-01-22 DIAGNOSIS — K529 Noninfective gastroenteritis and colitis, unspecified: Secondary | ICD-10-CM | POA: Diagnosis not present

## 2017-01-22 DIAGNOSIS — R109 Unspecified abdominal pain: Secondary | ICD-10-CM | POA: Diagnosis not present

## 2017-01-22 DIAGNOSIS — K2970 Gastritis, unspecified, without bleeding: Secondary | ICD-10-CM | POA: Diagnosis not present

## 2017-01-22 DIAGNOSIS — R10814 Left lower quadrant abdominal tenderness: Secondary | ICD-10-CM | POA: Diagnosis not present

## 2017-01-22 DIAGNOSIS — K8689 Other specified diseases of pancreas: Secondary | ICD-10-CM | POA: Diagnosis not present

## 2017-01-22 DIAGNOSIS — M545 Low back pain: Secondary | ICD-10-CM | POA: Diagnosis not present

## 2017-01-22 DIAGNOSIS — K828 Other specified diseases of gallbladder: Secondary | ICD-10-CM | POA: Diagnosis present

## 2017-01-22 DIAGNOSIS — K210 Gastro-esophageal reflux disease with esophagitis: Secondary | ICD-10-CM | POA: Diagnosis present

## 2017-01-22 DIAGNOSIS — K839 Disease of biliary tract, unspecified: Secondary | ICD-10-CM | POA: Diagnosis not present

## 2017-01-22 DIAGNOSIS — Z981 Arthrodesis status: Secondary | ICD-10-CM

## 2017-01-22 DIAGNOSIS — Z91030 Bee allergy status: Secondary | ICD-10-CM

## 2017-01-22 DIAGNOSIS — Z885 Allergy status to narcotic agent status: Secondary | ICD-10-CM | POA: Diagnosis not present

## 2017-01-22 DIAGNOSIS — J438 Other emphysema: Secondary | ICD-10-CM | POA: Diagnosis not present

## 2017-01-22 DIAGNOSIS — Z9049 Acquired absence of other specified parts of digestive tract: Secondary | ICD-10-CM | POA: Diagnosis not present

## 2017-01-22 HISTORY — DX: Pure hypercholesterolemia, unspecified: E78.00

## 2017-01-22 LAB — CBC WITH DIFFERENTIAL/PLATELET
Basophils Absolute: 0 10*3/uL (ref 0.0–0.1)
Basophils Relative: 0 %
Eosinophils Absolute: 0.1 10*3/uL (ref 0.0–0.7)
Eosinophils Relative: 1 %
HCT: 44.7 % (ref 36.0–46.0)
Hemoglobin: 14.9 g/dL (ref 12.0–15.0)
Lymphocytes Relative: 18 %
Lymphs Abs: 1.6 10*3/uL (ref 0.7–4.0)
MCH: 30.4 pg (ref 26.0–34.0)
MCHC: 33.3 g/dL (ref 30.0–36.0)
MCV: 91.2 fL (ref 78.0–100.0)
Monocytes Absolute: 1.2 10*3/uL — ABNORMAL HIGH (ref 0.1–1.0)
Monocytes Relative: 13 %
Neutro Abs: 5.9 10*3/uL (ref 1.7–7.7)
Neutrophils Relative %: 68 %
Platelets: 203 10*3/uL (ref 150–400)
RBC: 4.9 MIL/uL (ref 3.87–5.11)
RDW: 12.6 % (ref 11.5–15.5)
WBC: 8.7 10*3/uL (ref 4.0–10.5)

## 2017-01-22 LAB — C DIFFICILE QUICK SCREEN W PCR REFLEX
C Diff antigen: NEGATIVE
C Diff interpretation: NOT DETECTED
C Diff toxin: NEGATIVE

## 2017-01-22 LAB — COMPREHENSIVE METABOLIC PANEL
ALT: 14 U/L (ref 14–54)
AST: 16 U/L (ref 15–41)
Albumin: 3.8 g/dL (ref 3.5–5.0)
Alkaline Phosphatase: 88 U/L (ref 38–126)
Anion gap: 11 (ref 5–15)
BUN: 11 mg/dL (ref 6–20)
CO2: 25 mmol/L (ref 22–32)
Calcium: 9.1 mg/dL (ref 8.9–10.3)
Chloride: 101 mmol/L (ref 101–111)
Creatinine, Ser: 0.56 mg/dL (ref 0.44–1.00)
GFR calc Af Amer: >60 mL/min (ref >60)
GFR calc non Af Amer: >60 mL/min (ref >60)
Glucose, Bld: 137 mg/dL — ABNORMAL HIGH (ref 65–99)
Potassium: 3.2 mmol/L — ABNORMAL LOW (ref 3.5–5.1)
Sodium: 137 mmol/L (ref 135–145)
Total Bilirubin: 0.7 mg/dL (ref 0.3–1.2)
Total Protein: 6.7 g/dL (ref 6.5–8.1)

## 2017-01-22 LAB — LIPASE, BLOOD: Lipase: 15 U/L (ref 11–51)

## 2017-01-22 LAB — POC OCCULT BLOOD, ED: Fecal Occult Bld: POSITIVE — AB

## 2017-01-22 LAB — TYPE AND SCREEN
ABO/RH(D): O POS
Antibody Screen: NEGATIVE

## 2017-01-22 LAB — PROTIME-INR
INR: 0.95
Prothrombin Time: 12.7 seconds (ref 11.4–15.2)

## 2017-01-22 LAB — APTT: aPTT: 28 seconds (ref 24–36)

## 2017-01-22 MED ORDER — ACETAMINOPHEN 325 MG PO TABS: 650.0000 mg | ORAL_TABLET | Freq: Four times a day (QID) | ORAL | Status: DC | PRN

## 2017-01-22 MED ORDER — METRONIDAZOLE IN NACL 5-0.79 MG/ML-% IV SOLN
500.0000 mg | Freq: Once | INTRAVENOUS | Status: AC
  Administered 2017-01-22: 500 mg via INTRAVENOUS
  Filled 2017-01-22: qty 100

## 2017-01-22 MED ORDER — GABAPENTIN 300 MG PO CAPS
300.0000 mg | ORAL_CAPSULE | Freq: Three times a day (TID) | ORAL | Status: DC
  Administered 2017-01-22 – 2017-01-24 (×6): 300 mg via ORAL
  Filled 2017-01-22 (×6): qty 1

## 2017-01-22 MED ORDER — CIPROFLOXACIN IN D5W 400 MG/200ML IV SOLN
400.0000 mg | Freq: Once | INTRAVENOUS | Status: AC
  Administered 2017-01-22: 400 mg via INTRAVENOUS
  Filled 2017-01-22: qty 200

## 2017-01-22 MED ORDER — ONDANSETRON HCL 4 MG PO TABS: 4.0000 mg | ORAL_TABLET | Freq: Four times a day (QID) | ORAL | Status: DC | PRN

## 2017-01-22 MED ORDER — CYCLOBENZAPRINE HCL 10 MG PO TABS
10.0000 mg | ORAL_TABLET | Freq: Three times a day (TID) | ORAL | Status: DC | PRN
  Administered 2017-01-24: 10 mg via ORAL
  Filled 2017-01-22: qty 1

## 2017-01-22 MED ORDER — ALBUTEROL SULFATE HFA 108 (90 BASE) MCG/ACT IN AERS: 2.0000 | INHALATION_SPRAY | Freq: Four times a day (QID) | RESPIRATORY_TRACT | Status: DC | PRN

## 2017-01-22 MED ORDER — CIPROFLOXACIN IN D5W 400 MG/200ML IV SOLN
400.0000 mg | Freq: Two times a day (BID) | INTRAVENOUS | Status: DC
  Administered 2017-01-22 – 2017-01-24 (×4): 400 mg via INTRAVENOUS
  Filled 2017-01-22 (×4): qty 200

## 2017-01-22 MED ORDER — ONDANSETRON HCL 4 MG/2ML IJ SOLN
4.0000 mg | Freq: Once | INTRAMUSCULAR | Status: AC
  Administered 2017-01-22: 4 mg via INTRAVENOUS
  Filled 2017-01-22: qty 2

## 2017-01-22 MED ORDER — ONDANSETRON HCL 4 MG/2ML IJ SOLN: 4.0000 mg | Freq: Four times a day (QID) | INTRAMUSCULAR | Status: DC | PRN

## 2017-01-22 MED ORDER — SODIUM CHLORIDE 0.9 % IV BOLUS (SEPSIS)
500.0000 mL | Freq: Once | INTRAVENOUS | Status: AC
  Administered 2017-01-22: 500 mL via INTRAVENOUS

## 2017-01-22 MED ORDER — POTASSIUM CHLORIDE 2 MEQ/ML IV SOLN: INTRAVENOUS | Status: DC

## 2017-01-22 MED ORDER — HYDROCODONE-ACETAMINOPHEN 10-325 MG PO TABS
1.0000 | ORAL_TABLET | Freq: Four times a day (QID) | ORAL | Status: DC | PRN
  Administered 2017-01-22 – 2017-01-24 (×6): 1 via ORAL
  Filled 2017-01-22 (×7): qty 1

## 2017-01-22 MED ORDER — ALBUTEROL SULFATE (2.5 MG/3ML) 0.083% IN NEBU: 2.5000 mg | INHALATION_SOLUTION | Freq: Four times a day (QID) | RESPIRATORY_TRACT | Status: DC | PRN

## 2017-01-22 MED ORDER — PRAVASTATIN SODIUM 10 MG PO TABS
10.0000 mg | ORAL_TABLET | Freq: Every day | ORAL | Status: DC
  Administered 2017-01-22 – 2017-01-24 (×3): 10 mg via ORAL
  Filled 2017-01-22 (×3): qty 1

## 2017-01-22 MED ORDER — HYDROMORPHONE HCL 1 MG/ML IJ SOLN
0.5000 mg | Freq: Once | INTRAMUSCULAR | Status: AC
  Administered 2017-01-22: 0.5 mg via INTRAVENOUS
  Filled 2017-01-22: qty 1

## 2017-01-22 MED ORDER — IOPAMIDOL (ISOVUE-300) INJECTION 61%
INTRAVENOUS | Status: AC
  Filled 2017-01-22: qty 30

## 2017-01-22 MED ORDER — POTASSIUM CHLORIDE IN NACL 20-0.9 MEQ/L-% IV SOLN
INTRAVENOUS | Status: DC
  Administered 2017-01-22 – 2017-01-23 (×4): via INTRAVENOUS

## 2017-01-22 MED ORDER — ACETAMINOPHEN 650 MG RE SUPP: 650.0000 mg | Freq: Four times a day (QID) | RECTAL | Status: DC | PRN

## 2017-01-22 MED ORDER — METRONIDAZOLE IN NACL 5-0.79 MG/ML-% IV SOLN
500.0000 mg | Freq: Three times a day (TID) | INTRAVENOUS | Status: DC
  Administered 2017-01-22 – 2017-01-24 (×6): 500 mg via INTRAVENOUS
  Filled 2017-01-22 (×6): qty 100

## 2017-01-22 MED ORDER — POTASSIUM CHLORIDE CRYS ER 20 MEQ PO TBCR
30.0000 meq | EXTENDED_RELEASE_TABLET | Freq: Once | ORAL | Status: AC
  Administered 2017-01-22: 30 meq via ORAL
  Filled 2017-01-22: qty 2

## 2017-01-22 MED ORDER — PANTOPRAZOLE SODIUM 40 MG PO TBEC
40.0000 mg | DELAYED_RELEASE_TABLET | Freq: Every day | ORAL | Status: DC
  Administered 2017-01-23 – 2017-01-24 (×2): 40 mg via ORAL
  Filled 2017-01-22 (×2): qty 1

## 2017-01-22 MED ORDER — IOPAMIDOL (ISOVUE-300) INJECTION 61%: 75.0000 mL | Freq: Once | INTRAVENOUS | Status: DC | PRN

## 2017-01-22 NOTE — H&P (Signed)
History and Physical  Elizabeth Fowler ZOX:[id number] DOB: 1951-02-02 DOA: 01/22/2017   PCP: [name]   Patient coming from: Home  Chief Complaint: Home  HPI:  Elizabeth Fowler is a 67 y.o. female with medical history of COPD, GERD, tobacco abuse, hyperlipidemia, and chronic low back pain presented with approximate 4 days of abdominal pain with associated hematochezia and diarrhea. The patient states that this all began around 01/18/2017.  She denies any recent travels, eating any unusual or undercooked foods, or sick contacts. She denies any recent antibiotics. She states that she was placed on Prilosec and atorvastatin approximately 2-3 weeks prior to this admission. She denies any other new medications or over-the-counter supplements. On the day of admission, the patient stated that she had up to 15 loose bowel movements with bloody stool. She had some nausea without emesis. She denies any fevers, chills, chest pain, palpitations, dizziness, worsening shortness of breath, hemoptysis. The patient states that she has a chronic cough and shortness of breath which are unchanged. She denies any dysuria, hematuria, vaginal bleeding.  The patient states that she had a colonoscopy approximately 4-5 years ago removing 2 polyps.  In the emergency department, CT of the abdomen and pelvis revealed diffuse intrahepatic ductal dilatation with generalized prominence of the pancreatic duct. There was no pancreatic mass or inflammation. There was also wall thickening of the sigmoid and descending colon. The patient was started on Cipro and Flagyl. WBC was 8.7. BMP and hepatic enzymes were unremarkable except for potassium of 3.2.  Assessment/Plan: Acute colitis -Check C. Difficile -Stool pathogen panel -Continue Cipro and Flagyl -Start IV fluids -GI consult -clear liquid diet  Hematochezia -Secondary to colitis -Hemoglobin largely stable -Baseline hemoglobin 15 -Monitor hemoglobin  although expect mild drop due to dilution -Check coags  COPD with continued tobacco abuse -Presently stable on room air -Tobacco cessation discussed  Anxiety -Continue home dose alprazolam  Chronic back pain -Continue tenacity and home dose, gabapentin, and hydrocodone  Hypokalemia -Secondary to GI loss -Replete -Check magnesium  Biliary ductal dilatation -Likely physiologic secondary to cholecystectomy -LFTs are normal -Monitor LFTs        Past Medical History:  Diagnosis Date  . Acid reflux    occ  . Anxiety   . Arthritis    "right hip" (08/31/2013)  . Chronic bronchitis (HCC)    "got it q yr til pneumonia shot given" (08/31/2013)  . Chronic lower back pain   . Complication of anesthesia    pt. states a difficult time waking up-last 2 surgeries  . COPD (chronic obstructive pulmonary disease) (HCC)   . Depression   . Heart murmur    10 or more yrs ago echo neg unable to remember where  . High cholesterol   . Migraines    "goes to bed dark room; last migraine yrs ago hx  . MVP (mitral valve prolapse)    mild anterior leafleat MVP with mild MR 02/26/04 echo  . OCD (obsessive compulsive disorder)   . Pneumonia    "I've had pneumonia probably 30 times" (08/31/2013)  . Shingles 2016  . UTI (lower urinary tract infection) 03/14/2016   starting rx today   Past Surgical History:  Procedure Laterality Date  . ABDOMINAL EXPOSURE N/A 08/31/2013   Procedure: ABDOMINAL EXPOSURE;  Surgeon: Larina Earthly, MD;  Location: [facility] OR;  Service: Vascular;  Laterality: N/A;  . ABDOMINAL HYSTERECTOMY  05/1976  . ANTERIOR CERVICAL DECOMP/DISCECTOMY FUSION N/A 07/06/2014  Procedure: ANTERIOR CERVICAL DECOMPRESSION/DISCECTOMY FUSION 1 LEVEL;  Surgeon: Venita Lick, MD;  Location: [facility] OR;  Service: Orthopedics;  Laterality: N/A;  . ANTERIOR LUMBAR FUSION N/A 08/31/2013   Procedure: ALIF L5 - S1 1 LEVEL;  Surgeon: Venita Lick, MD;  Location: [facility] OR;  Service: Orthopedics;  Laterality:  N/A;  . APPENDECTOMY  1970's  . CARPAL TUNNEL RELEASE Left   . CHOLECYSTECTOMY  1970's  . COLONOSCOPY    . DILATION AND CURETTAGE OF UTERUS  12/1969  . erethra      ureter not urethra  . ESOPHAGOGASTRODUODENOSCOPY    . KIDNEY SURGERY  05/1976   S/P hysterectomy; cut ureter; had to repair kidney/bladder; thought they removed my kidney for years; records were destroyed; developed problems; told insides looked like mush; cleaned out the infection and repaired leaking bladder I think" (08/31/2013)  . MULTIPLE EXTRACTIONS WITH ALVEOLOPLASTY Bilateral 04/27/2015   Procedure: EXTRACTIONS WITH BILATERAL TORI, MAX (R) TORI;  Surgeon: Ocie Doyne, DDS;  Location: [facility] OR;  Service: Oral Surgery;  Laterality: Bilateral;  . SACROILIAC JOINT FUSION Right 03/19/2016   Procedure: SACROILIAC JOINT FUSION;  Surgeon: Venita Lick, MD;  Location: [facility] OR;  Service: Orthopedics;  Laterality: Right;  . WRIST SURGERY Right 1995   Social History:  reports that she has been smoking Cigarettes.  She has a 12.50 pack-year smoking history. She has never used smokeless tobacco. She reports that she uses drugs, including Marijuana. She reports that she does not drink alcohol.   Family History  Problem Relation Age of Onset  . Heart disease Mother   . Hypertension Mother      Allergies  Allergen Reactions  . Bee Venom Anaphylaxis  . Codeine Anaphylaxis and Swelling  . Penicillins Anaphylaxis, Swelling and Other (See Comments)    Has patient had a PCN reaction causing immediate rash, facial/tongue/throat swelling, SOB or lightheadedness with hypotension: No Has patient had a PCN reaction causing severe rash involving mucus membranes or skin necrosis: Yes Has patient had a PCN reaction that required hospitalization: No Has patient had a PCN reaction occurring within the last 10 years: No If all of the above answers are "NO", then may proceed with Cephalosporin use.     Prior to Admission medications   Medication  Sig Start Date End Date Taking? Authorizing Provider  albuterol (PROVENTIL HFA;VENTOLIN HFA) 108 (90 BASE) MCG/ACT inhaler Inhale 2 puffs into the lungs every 6 (six) hours as needed for wheezing or shortness of breath.   Yes [provider]  cyclobenzaprine (FLEXERIL) 10 MG tablet Take 10 mg by mouth 3 (three) times daily as needed for muscle spasms.   Yes [provider]  EPINEPHrine (EPIPEN 2-PAK) 0.3 mg/0.3 mL IJ SOAJ injection Inject 0.3 mg into the muscle once.   Yes [provider]  gabapentin (NEURONTIN) 300 MG capsule Take 300 mg by mouth 3 (three) times daily.    Yes [provider]  HYDROcodone-acetaminophen (NORCO) 10-325 MG tablet Take 1 tablet by mouth every 6 (six) hours as needed. Patient taking differently: Take 1 tablet by mouth every 6 (six) hours as needed for moderate pain or severe pain. Up to 5 tablets daily 03/19/16  Yes Venita Lick, MD  Menthol, Topical Analgesic, (ASPERCREME MAX ROLL-ON) 16 % LIQD Apply 1 application topically as needed (for pain).   Yes [provider]  omeprazole (PRILOSEC) 20 MG capsule Take 1 po BID x 2 weeks then once a day 09/23/16  Yes Devoria Albe, MD  pravastatin (PRAVACHOL)  10 MG tablet Take 10 mg by mouth daily.   Yes [provider]  HYDROcodone-acetaminophen (NORCO) 10-325 MG tablet Take 1-2 tablets by mouth every 6 (six) hours as needed for moderate pain.    [provider]    Review of Systems:  Constitutional:  No weight loss, night sweats, Fevers, chills, fatigue.  Head&Eyes: No headache.  No vision loss.  No eye pain or scotoma ENT:  No Difficulty swallowing,Tooth/dental problems,Sore throat,  No ear ache, post nasal drip,  Cardio-vascular:  No chest pain, Orthopnea, PND, swelling in lower extremities,  dizziness, palpitations  GI:  No  abdominal pain, nausea,  loss of appetite,  melena, heartburn, indigestion, Resp:  No shortness of breath with exertion or at rest. No  cough. No coughing up of blood .No wheezing.No chest wall deformity  Skin:  no rash or lesions.  GU:  no dysuria, change in color of urine, no urgency or frequency. No flank pain.  Musculoskeletal:  No joint pain or swelling. No decreased range of motion. No back pain.  Psych:  No change in mood or affect. No depression or anxiety. Neurologic: No headache, no dysesthesia, no focal weakness, no vision loss. No syncope  Physical Exam: Vitals:   01/22/17 1035 01/22/17 1141 01/22/17 1200 01/22/17 1300  BP:  (!) 109/56 123/66 130/67  Pulse:  78 80 85  Resp:  18 17 15   Temp:  (!) 97.5 F (36.4 C)    TempSrc:  Oral    SpO2:  94% 96% 91%  Weight: 52.6 kg (116 lb)     Height: 5' 1.5" (1.562 m)      General:  A&O x 3, NAD, nontoxic, pleasant/cooperative Head/Eye: No conjunctival hemorrhage, no icterus, /AT, No nystagmus ENT:  No icterus,  No thrush, good dentition, no pharyngeal exudate Neck:  No masses, no lymphadenpathy, no bruits CV:  RRR, no rub, no gallop, no S3 Lung:  Diminished breath sounds butCTAB, good air movement, no wheeze, no rhonchi Abdomen: soft/diffusely tender, but isolated to LLQ, +BS, nondistended, no peritoneal signs Ext: No cyanosis, No rashes, No petechiae, No lymphangitis, No edema Neuro: CNII-XII intact, strength 4/5 in bilateral upper and lower extremities, no dysmetria  Labs on Admission:  Basic Metabolic Panel:  Recent Labs Lab 01/22/17 1049  NA 137  K 3.2*  CL 101  CO2 25  GLUCOSE 137*  BUN 11  CREATININE 0.56  CALCIUM 9.1   Liver Function Tests:  Recent Labs Lab 01/22/17 1049  AST 16  ALT 14  ALKPHOS 88  BILITOT 0.7  PROT 6.7  ALBUMIN 3.8    Recent Labs Lab 01/22/17 1049  LIPASE 15   No results for input(s): AMMONIA in the last 168 hours. CBC:  Recent Labs Lab 01/22/17 1049  WBC 8.7  NEUTROABS 5.9  HGB 14.9  HCT 44.7  MCV 91.2  PLT 203   Coagulation Profile:  Recent Labs Lab 01/22/17 1049  INR 0.95   Cardiac  Enzymes: No results for input(s): CKTOTAL, CKMB, CKMBINDEX, TROPONINI in the last 168 hours. BNP: Invalid input(s): POCBNP CBG: No results for input(s): GLUCAP in the last 168 hours. Urine analysis:    Component Value Date/Time   COLORURINE YELLOW 02/21/2014 2210   APPEARANCEUR CLOUDY (A) 02/21/2014 2210   LABSPEC 1.009 02/21/2014 2210   PHURINE 6.5 02/21/2014 2210   GLUCOSEU NEGATIVE 02/21/2014 2210   HGBUR NEGATIVE 02/21/2014 2210   BILIRUBINUR NEGATIVE 02/21/2014 2210   KETONESUR NEGATIVE 02/21/2014 2210   PROTEINUR NEGATIVE 02/21/2014  2210   UROBILINOGEN 1.0 02/21/2014 2210   NITRITE POSITIVE (A) 02/21/2014 2210   LEUKOCYTESUR NEGATIVE 02/21/2014 2210   Sepsis Labs: @LABRCNTIP (procalcitonin:4,lacticidven:4) )No results found for this or any previous visit (from the past 240 hour(s)).   Radiological Exams on Admission: Ct Abdomen Pelvis W Contrast  Result Date: 01/22/2017 CLINICAL DATA:  Abdominal pain, left-sided, and rectal bleeding. Nausea. EXAM: CT ABDOMEN AND PELVIS WITH CONTRAST TECHNIQUE: Multidetector CT imaging of the abdomen and pelvis was performed using the standard protocol following bolus administration of intravenous contrast. CONTRAST:  72 mL Isovue 370 nonionic COMPARISON:  Abdominal ultrasound March 13, 2009 FINDINGS: Lower chest: There is mild scarring in the lung bases. No lung base edema or consolidation. There is a cyst immediately adjacent to the pericardium on the left measuring 1.8 x 1.1 cm. Hepatobiliary: There is a cyst in the lateral segment of the left lobe of the liver measuring 1.3 x 1.0 cm. No other focal liver lesion evident. There is mild fatty infiltration near the fissure for the ligamentum teres. Gallbladder is absent. There is diffuse intrahepatic biliary duct dilatation. The common bile duct measures 11 mm, enlarged. No mass or calculus evident. Pancreas: There is generalized prominence of the pancreatic duct with the pancreatic duct measuring up  to 7 mm. No mass or calculus is evident in the pancreatic duct region. There is no pancreatic mass or pancreatic inflammation appreciable. Spleen: No focal splenic lesions are evident. Adrenals/Urinary Tract: Adrenals appear normal bilaterally. There is scarring in the upper pole of the left kidney. There is no renal mass or hydronephrosis on either side. There is a 5 x 3 mm nonobstructing calculus in the upper pole of the left kidney. There is no ureteral calculus on either side. Urinary bladder is midline with wall thickness within normal limits. Stomach/Bowel: There is wall thickening involving portions of the sigmoid colon and distal descending colon wall without appreciable diverticular inflammation or mesenteric inflammation. Similar bowel wall thickening is not noted elsewhere. No bowel obstruction. No free air or portal venous air. Vascular/Lymphatic: There is atherosclerotic calcification and in the aorta and common iliac arteries. There is also internal and external iliac artery calcification. No aneurysm evident. Major mesenteric vessels appear patent. No adenopathy is appreciable in abdomen and pelvis. Reproductive: Uterus is absent.  No pelvic mass. Other: Appendix is absent. No abscess or ascites evident in the abdomen or pelvis. Musculoskeletal: There is postoperative change at L5 and S1, as well as in the right sacroiliac joint. There are no blastic or lytic bone lesions. There is no intramuscular or abdominal wall lesion. IMPRESSION: 1. Wall thickening in portions of the descending colon and sigmoid colon, likely due to colitis. Frank diverticulitis not seen. No similar bowel wall thickening seen elsewhere. Given history of rectal bleeding, direct visualization of the colon may well be warranted after treatment for what appears to be left colonic inflammation. 2. Diffuse intrahepatic and extrahepatic biliary duct dilatation as well as generalized pancreatic duct dilatation. Gallbladder absent. No  pancreatic or biliary duct mass or calculus evident. Given the degree of pancreatic duct dilatation, correlation with MR/MRCP nonemergently may be advisable to further evaluate. 3. Scarring upper pole left kidney. Nonobstructing calculus upper left kidney. No hydronephrosis or ureteral calculus on either side. 4.  Extensive aortoiliac atherosclerosis. 5. Gallbladder, uterus, and appendix absent. Postoperative change lower lumbar spine and upper sacrum and right sacroiliac joint. 6.  Apparent small pericardial cyst on the left. Aortic Atherosclerosis (ICD10-I70.0). Electronically Signed   By: Chrissie Noa  Margarita GrizzleWoodruff III [name].   On: 01/22/2017 13:49        Time spent:60 minutes Code Status:   FULL Family Communication:  No Family at bedside Disposition Plan: expect 2-3 day hospitalization Consults called: GI--Rourk DVT Prophylaxis: [place] Lovenox/Heparin   SCDs  Camellia Popescu, DO  [facility] Pager [phone]  If 7PM-7AM, please contact night-coverage [email or web address] Password [email or web address] 01/22/2017, 2:50 PM

## 2017-01-22 NOTE — ED Notes (Signed)
Pt went to bathroom and had small amt of liquid brown stool with flecks of blood.  Not enough to obtain sample.

## 2017-01-22 NOTE — ED Notes (Signed)
Pt to CT

## 2017-01-22 NOTE — ED Notes (Signed)
Pt is now drinking her second bottle of CT drink

## 2017-01-22 NOTE — ED Notes (Signed)
PA at bedside.

## 2017-01-22 NOTE — ED Notes (Signed)
Pt back from CT

## 2017-01-22 NOTE — ED Triage Notes (Addendum)
Pt brought in by [facility] and c/o lower abdominal pain and bright red blood per rectum x 4 days. Pt also c/o nausea and diarrhea x 15 this morning.

## 2017-01-22 NOTE — Consult Note (Signed)
Referring Provider: Catarina Hartshorn, MD Primary Care Physician:  [name] Primary Gastroenterologist:  Roetta Sessions, MD  Reason for Consultation:  Bloody diarrhea/abnormal colon on ct  HPI: Elizabeth Fowler is a 66 y.o. female presenting to the ED with 4 day history of abdominal pain associated with bloody diarrhea. Patient states historically she has issues with her bowels. Takes a prescription "stool softener" for predominant constipation in the setting of chronic narcotics. She cannot recall the name. Over the weekend she developed acute onset diarrhea which is been persistent. Associated with progressive abdominal pain predominantly left-sided. Complains of bloody stool. 10-15 episodes today. She reports antibiotic use about 6 weeks ago. She had a salad from [employer] about a week ago. A couple weeks ago she had a seafood boil but only ate sausage. No other questionable food consumptions on her part. She denies any ill contacts. She complains of significant abdominal distention. Nausea without vomiting. No melena. She's been on omeprazole for nausea for nearly 2 months. Denies any typical heartburn or dysphagia. No NSAID or aspirin use.  In 2010 she had EGD and colonoscopy for weight loss/dysphagia/chronic constipation. She had erosive reflux esophagitis with a noncritical esophageal ring status post dilation, mottling submucosal petechiae of the gastric mucosa, superficial stellate-shaped prepyloric antral ulcer, biopsy from both sites benign and no H. pylori. She had anal canal hemorrhoids, submucosal petechiae, sigmoid colon, left-sided and transverse diverticula, small hepatic flexure polyp removed in not recovered, rectal polyp hyperplastic.    Prior to Admission medications   Medication Sig Start Date End Date Taking? Authorizing Provider  albuterol (PROVENTIL HFA;VENTOLIN HFA) 108 (90 BASE) MCG/ACT inhaler Inhale 2 puffs into the lungs every 6 (six) hours as needed for  wheezing or shortness of breath.   Yes [provider]  cyclobenzaprine (FLEXERIL) 10 MG tablet Take 10 mg by mouth 3 (three) times daily as needed for muscle spasms.   Yes [provider]  EPINEPHrine (EPIPEN 2-PAK) 0.3 mg/0.3 mL IJ SOAJ injection Inject 0.3 mg into the muscle once.   Yes [provider]  gabapentin (NEURONTIN) 300 MG capsule Take 300 mg by mouth 3 (three) times daily.    Yes [provider]  HYDROcodone-acetaminophen (NORCO) 10-325 MG tablet Take 1 tablet by mouth every 6 (six) hours as needed. Patient taking differently: Take 1 tablet by mouth every 6 (six) hours as needed for moderate pain or severe pain. Up to 5 tablets daily 03/19/16  Yes Venita Lick, MD  Menthol, Topical Analgesic, (ASPERCREME MAX ROLL-ON) 16 % LIQD Apply 1 application topically as needed (for pain).   Yes [provider]  omeprazole (PRILOSEC) 20 MG capsule 1 by mouth daily  09/23/16  Yes Devoria Albe, MD  pravastatin (PRAVACHOL) 10 MG tablet Take 10 mg by mouth daily.   Yes [provider]           Current Facility-Administered Medications  Medication Dose Route Frequency Provider Last Rate Last Dose  . ciprofloxacin (CIPRO) IVPB 400 mg  400 mg Intravenous Once Loren Racer, MD      . iopamidol (ISOVUE-300) 61 % injection 75 mL  75 mL Intravenous Once PRN Loren Racer, MD      . iopamidol (ISOVUE-300) 61 % injection           . metroNIDAZOLE (FLAGYL) IVPB 500 mg  500 mg Intravenous Once Loren Racer, MD         Allergies as of 01/22/2017 - Review Complete 01/22/2017  Allergen Reaction Noted  .  Bee venom Anaphylaxis 04/19/2015  . Codeine Anaphylaxis and Swelling 08/26/2014  . Penicillins Anaphylaxis, Swelling, and Other (See Comments) 01/22/2017    Past Medical History:  Diagnosis Date  . Acid reflux    occ  . Anxiety   . Arthritis    "right hip" (08/31/2013)  . Chronic bronchitis (HCC)    "got it q yr til pneumonia shot  given" (08/31/2013)  . Chronic lower back pain   . Complication of anesthesia    pt. states a difficult time waking up-last 2 surgeries  . COPD (chronic obstructive pulmonary disease) (HCC)   . Depression   . Heart murmur    10 or more yrs ago echo neg unable to remember where  . High cholesterol   . Migraines    "goes to bed dark room; last migraine yrs ago hx  . MVP (mitral valve prolapse)    mild anterior leafleat MVP with mild MR 02/26/04 echo  . OCD (obsessive compulsive disorder)   . Pneumonia    "I've had pneumonia probably 30 times" (08/31/2013)  . Shingles 2016  . UTI (lower urinary tract infection) 03/14/2016   starting rx today    Past Surgical History:  Procedure Laterality Date  . ABDOMINAL EXPOSURE N/A 08/31/2013   Procedure: ABDOMINAL EXPOSURE;  Surgeon: Larina Earthly, MD;  Location: [facility] OR;  Service: Vascular;  Laterality: N/A;  . ABDOMINAL HYSTERECTOMY  05/1976  . ANTERIOR CERVICAL DECOMP/DISCECTOMY FUSION N/A 07/06/2014   Procedure: ANTERIOR CERVICAL DECOMPRESSION/DISCECTOMY FUSION 1 LEVEL;  Surgeon: Venita Lick, MD;  Location: [facility] OR;  Service: Orthopedics;  Laterality: N/A;  . ANTERIOR LUMBAR FUSION N/A 08/31/2013   Procedure: ALIF L5 - S1 1 LEVEL;  Surgeon: Venita Lick, MD;  Location: [facility] OR;  Service: Orthopedics;  Laterality: N/A;  . APPENDECTOMY  1970's  . CARPAL TUNNEL RELEASE Left   . CHOLECYSTECTOMY  1970's  . COLONOSCOPY WITH ESOPHAGOGASTRODUODENOSCOPY (EGD)  04/23/2009   Dr. Jena Gauss: Distal erosive reflux esophagitis with noncritical Schatzki ring status post dilation. Small hiatal hernia. Mottling submucosal petechiae of the gastric mucosa, benign-appearing stellate shaped prepyloric antral ulcer both with benign biopsies and no H. pylori. On colonoscopy she had anal canal hemorrhoids, diminutive hyperplastic rectal polyp. Diverticulosis. Hepatic flexure polyp lost  . DILATION AND CURETTAGE OF UTERUS  12/1969  . erethra      ureter not urethra  . KIDNEY  SURGERY  05/1976   S/P hysterectomy; cut ureter; had to repair kidney/bladder; thought they removed my kidney for years; records were destroyed; developed problems; told insides looked like mush; cleaned out the infection and repaired leaking bladder I think" (08/31/2013)  . MULTIPLE EXTRACTIONS WITH ALVEOLOPLASTY Bilateral 04/27/2015   Procedure: EXTRACTIONS WITH BILATERAL TORI, MAX (R) TORI;  Surgeon: Ocie Doyne, DDS;  Location: [facility] OR;  Service: Oral Surgery;  Laterality: Bilateral;  . SACROILIAC JOINT FUSION Right 03/19/2016   Procedure: SACROILIAC JOINT FUSION;  Surgeon: Venita Lick, MD;  Location: [facility] OR;  Service: Orthopedics;  Laterality: Right;  . WRIST SURGERY Right 1995    Family History  Problem Relation Age of Onset  . Heart disease Mother   . Hypertension Mother   . Colon cancer Neg Hx   . Inflammatory bowel disease Neg Hx     Social History   Social History  . Marital status: Married    Spouse name: N/A  . Number of children: N/A  . Years of education: N/A   Occupational History  . Not on file.   Social  History Main Topics  . Smoking status: Current Every Day Smoker    Packs/day: 0.25    Years: 50.00    Types: Cigarettes  . Smokeless tobacco: Never Used  . Alcohol use No     Comment: 08/31/2013 "drank a little years ago; last drink was probably in the 1990's"  . Drug use: Yes    Types: Marijuana     Comment: last used 01/21/17  . Sexual activity: No   Other Topics Concern  . Not on file   Social History Narrative  . No narrative on file     ROS:  General: Negative for anorexia, weight loss, fever, chills, fatigue, Positive weakness. Eyes: Negative for vision changes.  ENT: Negative for hoarseness, difficulty swallowing , nasal congestion. CV: Negative for chest pain, angina, palpitations, dyspnea on exertion, peripheral edema.  Respiratory: Negative for dyspnea at rest, dyspnea on exertion, cough, sputum, wheezing.  GI: See history of present  illness. GU:  Negative for dysuria, hematuria, urinary incontinence, urinary frequency, nocturnal urination.  ZO:[id number]:Chronic back pain  Derm: Negative for rash or itching.  Neuro: Negative for weakness, abnormal sensation, seizure, frequent headaches, memory loss, confusion.  Psych: Negative for anxiety, depression, suicidal ideation, hallucinations.  Endo: Negative for unusual weight change.  Heme: Negative for bruising or bleeding. Allergy: Negative for rash or hives.       Physical Examination: Vital signs in last 24 hours: Temp:  [97.5 F (36.4 C)-97.8 F (36.6 C)] 97.5 F (36.4 C) (07/05 1141) Pulse Rate:  [78-85] 85 (07/05 1300) Resp:  [15-18] 15 (07/05 1300) BP: (109-161)/(56-139) 130/67 (07/05 1300) SpO2:  [91 %-100 %] 91 % (07/05 1300) Weight:  [116 lb (52.6 kg)] 116 lb (52.6 kg) (07/05 1035)    General: Well-nourished, well-developed in no acute distress.Thin. Accompanied by spouse.  Head: Normocephalic, atraumatic.   Eyes: Conjunctiva pink, no icterus. Mouth: Oropharyngeal mucosa moist and pink , no lesions erythema or exudate. Neck: Supple without thyromegaly, masses, or lymphadenopathy.  Lungs: Clear to auscultation bilaterally.  Heart: Regular rate and rhythm, no murmurs rubs or gallops.  Abdomen: Bowel sounds are normal,diffuse tenderness worse on the left side, nondistended, no hepatosplenomegaly or masses, no abdominal bruits or    hernia , no rebound. Positive subjective guarding   Rectal: Not performed. Heme positive specimen.  Extremities: No lower extremity edema, clubbing, deformity.  Neuro: Alert and oriented x 4 , grossly normal neurologically.  Skin: Warm and dry, no rash or jaundice.   Psych: Alert and cooperative, normal mood and affect.        Intake/Output from previous day: No intake/output data recorded. Intake/Output this shift: No intake/output data recorded.  Lab Results: CBC  Recent Labs  01/22/17 1049  WBC 8.7  HGB 14.9  HCT 44.7   MCV 91.2  PLT 203   BMET  Recent Labs  01/22/17 1049  NA 137  K 3.2*  CL 101  CO2 25  GLUCOSE 137*  BUN 11  CREATININE 0.56  CALCIUM 9.1   LFT  Recent Labs  01/22/17 1049  BILITOT 0.7  ALKPHOS 88  AST 16  ALT 14  PROT 6.7  ALBUMIN 3.8    Lipase  Recent Labs  01/22/17 1049  LIPASE 15    PT/INR  Recent Labs  01/22/17 1049  LABPROT 12.7  INR 0.95      Imaging Studies: Ct Abdomen Pelvis W Contrast  Result Date: 01/22/2017 CLINICAL DATA:  Abdominal pain, left-sided, and rectal bleeding. Nausea. EXAM: CT ABDOMEN  AND PELVIS WITH CONTRAST TECHNIQUE: Multidetector CT imaging of the abdomen and pelvis was performed using the standard protocol following bolus administration of intravenous contrast. CONTRAST:  72 mL Isovue 370 nonionic COMPARISON:  Abdominal ultrasound March 13, 2009 FINDINGS: Lower chest: There is mild scarring in the lung bases. No lung base edema or consolidation. There is a cyst immediately adjacent to the pericardium on the left measuring 1.8 x 1.1 cm. Hepatobiliary: There is a cyst in the lateral segment of the left lobe of the liver measuring 1.3 x 1.0 cm. No other focal liver lesion evident. There is mild fatty infiltration near the fissure for the ligamentum teres. Gallbladder is absent. There is diffuse intrahepatic biliary duct dilatation. The common bile duct measures 11 mm, enlarged. No mass or calculus evident. Pancreas: There is generalized prominence of the pancreatic duct with the pancreatic duct measuring up to 7 mm. No mass or calculus is evident in the pancreatic duct region. There is no pancreatic mass or pancreatic inflammation appreciable. Spleen: No focal splenic lesions are evident. Adrenals/Urinary Tract: Adrenals appear normal bilaterally. There is scarring in the upper pole of the left kidney. There is no renal mass or hydronephrosis on either side. There is a 5 x 3 mm nonobstructing calculus in the upper pole of the left kidney.  There is no ureteral calculus on either side. Urinary bladder is midline with wall thickness within normal limits. Stomach/Bowel: There is wall thickening involving portions of the sigmoid colon and distal descending colon wall without appreciable diverticular inflammation or mesenteric inflammation. Similar bowel wall thickening is not noted elsewhere. No bowel obstruction. No free air or portal venous air. Vascular/Lymphatic: There is atherosclerotic calcification and in the aorta and common iliac arteries. There is also internal and external iliac artery calcification. No aneurysm evident. Major mesenteric vessels appear patent. No adenopathy is appreciable in abdomen and pelvis. Reproductive: Uterus is absent.  No pelvic mass. Other: Appendix is absent. No abscess or ascites evident in the abdomen or pelvis. Musculoskeletal: There is postoperative change at L5 and S1, as well as in the right sacroiliac joint. There are no blastic or lytic bone lesions. There is no intramuscular or abdominal wall lesion. IMPRESSION: 1. Wall thickening in portions of the descending colon and sigmoid colon, likely due to colitis. Frank diverticulitis not seen. No similar bowel wall thickening seen elsewhere. Given history of rectal bleeding, direct visualization of the colon may well be warranted after treatment for what appears to be left colonic inflammation. 2. Diffuse intrahepatic and extrahepatic biliary duct dilatation as well as generalized pancreatic duct dilatation. Gallbladder absent. No pancreatic or biliary duct mass or calculus evident. Given the degree of pancreatic duct dilatation, correlation with MR/MRCP nonemergently may be advisable to further evaluate. 3. Scarring upper pole left kidney. Nonobstructing calculus upper left kidney. No hydronephrosis or ureteral calculus on either side. 4.  Extensive aortoiliac atherosclerosis. 5. Gallbladder, uterus, and appendix absent. Postoperative change lower lumbar spine and  upper sacrum and right sacroiliac joint. 6.  Apparent small pericardial cyst on the left. Aortic Atherosclerosis (ICD10-I70.0). Electronically Signed   By: Bretta Bang III [name].   On: 01/22/2017 13:49  [4 week]   Impression: 66 year old female presenting to the ER with acute onset diarrhea with blood in the stool, progressive abdominal pain over the past 4 days. At baseline she has predominant constipation in the setting of chronic narcotic use. Last colonoscopy in 2010 as outlined above. CT with wall thickening in portions of the descending  colon and sigmoid colon likely due to colitis. No overt diverticulitis. She notes decreased blood in the stool over the last couple episodes. She has had 10-15 stools today. I suspect infectious etiology, IBD and malignancy less likely. Mesenteric ischemia remains in differential but at least major mesenteric vasculature remains patent.    In addition she has diffuse intrahepatic and extra hepatic biliary duct dilation as well as pancreatic duct dilation. LFTs and lipase are normal. Related to chronic narcotic use. May require further imaging at a later date.  Plan: 1. Stool for Cdiff and GI pathogen panel.  2. Agree with antibiotic coverage for now.  3. Clear liquid diet.  4. At this time the amount of rectal bleeding appears limited but will monitor closely.  5. If no significant improvement in symptoms over next 24-48 hours,  she may require colonoscopy this admission. Otherwise, would consider colonoscopy as outpatient once over acute illness.  PATIENT WILL REQUIRE PROPOFOL DUE TO FAILED CONSCIOUS SEDATION IN 2010.  We would like to thank you for the opportunity to participate in the care of Elizabeth Fowler.  Leanna Battles. [facility] [phone] 7/5/20183:47 PM     LOS: 0 days

## 2017-01-22 NOTE — ED Notes (Signed)
Pt is currently on bedpan. Am allowing pt to finish BM before transporting her upstairs.

## 2017-01-22 NOTE — ED Provider Notes (Signed)
[facility]-EM[facility]Y DEPT Provider Note   CSN: [id number] Arrival date & time: 01/22/17  1028     History   Chief Complaint Chief Complaint  Patient presents with  . Abdominal Pain  . Rectal Bleeding    HPI Elizabeth Fowler is a 66 y.o. female.  HPI patient presents with 4 days of lower abdominal pain and passage of bloody stool. States she's had 10 episodes of bloody stool passage today. She also is having nausea but no vomiting. Has had decreased urination. No sick contacts. No new back pain.  Past Medical History:  Diagnosis Date  . Acid reflux    occ  . Anxiety   . Arthritis    "right hip" (08/31/2013)  . Chronic bronchitis (HCC)    "got it q yr til pneumonia shot given" (08/31/2013)  . Chronic lower back pain   . Complication of anesthesia    pt. states a difficult time waking up-last 2 surgeries  . COPD (chronic obstructive pulmonary disease) (HCC)   . Depression   . Heart murmur    10 or more yrs ago echo neg unable to remember where  . High cholesterol   . Migraines    "goes to bed dark room; last migraine yrs ago hx  . MVP (mitral valve prolapse)    mild anterior leafleat MVP with mild MR 02/26/04 echo  . OCD (obsessive compulsive disorder)   . Pneumonia    "I've had pneumonia probably 30 times" (08/31/2013)  . Shingles 2016  . UTI (lower urinary tract infection) 03/14/2016   starting rx today    Patient Active Problem List   Diagnosis Date Noted  . Hematochezia 01/22/2017  . Sacroiliac dysfunction 03/19/2016  . Neck pain 07/06/2014  . Back pain 08/31/2013  . Degeneration of intervertebral disc, site unspecified 08/16/2013  . Stiffness of joints, not elsewhere classified, multiple sites 03/16/2013  . Weakness of both legs 03/16/2013  . Difficulty in walking(719.7) 03/16/2013  . COPD 04/10/2009  . CONSTIPATION, CHRONIC 04/10/2009  . ARTHRITIS 04/10/2009  . BACK PAIN, LUMBOSACRAL, CHRONIC 04/10/2009  . WEIGHT LOSS, ABNORMAL 04/10/2009  . OTHER DYSPHAGIA  04/10/2009    Past Surgical History:  Procedure Laterality Date  . ABDOMINAL EXPOSURE N/A 08/31/2013   Procedure: ABDOMINAL EXPOSURE;  Surgeon: Larina Earthly, MD;  Location: [facility] OR;  Service: Vascular;  Laterality: N/A;  . ABDOMINAL HYSTERECTOMY  05/1976  . ANTERIOR CERVICAL DECOMP/DISCECTOMY FUSION N/A 07/06/2014   Procedure: ANTERIOR CERVICAL DECOMPRESSION/DISCECTOMY FUSION 1 LEVEL;  Surgeon: Venita Lick, MD;  Location: [facility] OR;  Service: Orthopedics;  Laterality: N/A;  . ANTERIOR LUMBAR FUSION N/A 08/31/2013   Procedure: ALIF L5 - S1 1 LEVEL;  Surgeon: Venita Lick, MD;  Location: [facility] OR;  Service: Orthopedics;  Laterality: N/A;  . APPENDECTOMY  1970's  . CARPAL TUNNEL RELEASE Left   . CHOLECYSTECTOMY  1970's  . COLONOSCOPY    . DILATION AND CURETTAGE OF UTERUS  12/1969  . erethra      ureter not urethra  . ESOPHAGOGASTRODUODENOSCOPY    . KIDNEY SURGERY  05/1976   S/P hysterectomy; cut ureter; had to repair kidney/bladder; thought they removed my kidney for years; records were destroyed; developed problems; told insides looked like mush; cleaned out the infection and repaired leaking bladder I think" (08/31/2013)  . MULTIPLE EXTRACTIONS WITH ALVEOLOPLASTY Bilateral 04/27/2015   Procedure: EXTRACTIONS WITH BILATERAL TORI, MAX (R) TORI;  Surgeon: Ocie Doyne, DDS;  Location: [facility] OR;  Service: Oral Surgery;  Laterality: Bilateral;  .  SACROILIAC JOINT FUSION Right 03/19/2016   Procedure: SACROILIAC JOINT FUSION;  Surgeon: Venita Lick, MD;  Location[facility] OR;  Service: Orthopedics;  Laterality: Right;  . WRIST SURGERY Right 1995    OB History    No data available       Home Medications    Prior to Admission medications   Medication Sig Start Date End Date Taking? Authorizing Provider  albuterol (PROVENTIL HFA;VENTOLIN HFA) 108 (90 BASE) MCG/ACT inhaler Inhale 2 puffs into the lungs every 6 (six) hours as needed for wheezing or shortness of breath.   Yes [provider]    cyclobenzaprine (FLEXERIL) 10 MG tablet Take 10 mg by mouth 3 (three) times daily as needed for muscle spasms.   Yes [provider]  EPINEPHrine (EPIPEN 2-PAK) 0.3 mg/0.3 mL IJ SOAJ injection Inject 0.3 mg into the muscle once.   Yes [provider]  gabapentin (NEURONTIN) 300 MG capsule Take 300 mg by mouth 3 (three) times daily.    Yes [provider]  HYDROcodone-acetaminophen (NORCO) 10-325 MG tablet Take 1 tablet by mouth every 6 (six) hours as needed. Patient taking differently: Take 1 tablet by mouth every 6 (six) hours as needed for moderate pain or severe pain. Up to 5 tablets daily 03/19/16  Yes Venita Lick, MD  Menthol, Topical Analgesic, (ASPERCREME MAX ROLL-ON) 16 % LIQD Apply 1 application topically as needed (for pain).   Yes [provider]  omeprazole (PRILOSEC) 20 MG capsule Take 1 po BID x 2 weeks then once a day 09/23/16  Yes Devoria Albe, MD  pravastatin (PRAVACHOL) 10 MG tablet Take 10 mg by mouth daily.   Yes [provider]  HYDROcodone-acetaminophen (NORCO) 10-325 MG tablet Take 1-2 tablets by mouth every 6 (six) hours as needed for moderate pain.    [provider]    Family History Family History  Problem Relation Age of Onset  . Heart disease Mother   . Hypertension Mother     Social History Social History  Substance Use Topics  . Smoking status: Current Every Day Smoker    Packs/day: 0.25    Years: 50.00    Types: Cigarettes  . Smokeless tobacco: Never Used  . Alcohol use No     Comment: 08/31/2013 "drank a little years ago; last drink was probably in the 1990's"     Allergies   Bee venom; Codeine; and Penicillins   Review of Systems Review of Systems  Constitutional: Negative for chills and fever.  Eyes: Negative for visual disturbance.  Respiratory: Negative for cough and shortness of breath.   Cardiovascular: Negative for chest pain and palpitations.  Gastrointestinal: Positive for abdominal  pain, blood in stool, diarrhea and nausea. Negative for constipation and vomiting.  Genitourinary: Negative for dysuria, flank pain and hematuria.  Musculoskeletal: Positive for back pain. Negative for myalgias, neck pain and neck stiffness.  Skin: Negative for rash and wound.  Neurological: Negative for dizziness, syncope, weakness, numbness and headaches.  All other systems reviewed and are negative.    Physical Exam Updated Vital Signs BP 123/66   Pulse 80   Temp (!) 97.5 F (36.4 C) (Oral)   Resp 17   Ht 5' 1.5" (1.562 m)   Wt 52.6 kg (116 lb)   SpO2 96%   BMI 21.56 kg/m   Physical Exam  Constitutional: She is oriented to person, place, and time. She appears well-developed and well-nourished. No distress.  HENT:  Head: Normocephalic and atraumatic.  Mouth/Throat: Oropharynx  is clear and moist. No oropharyngeal exudate.  Eyes: EOM are normal. Pupils are equal, round, and reactive to light.  Neck: Normal range of motion. Neck supple.  Cardiovascular: Normal rate and regular rhythm.  Exam reveals no gallop and no friction rub.   No murmur heard. Pulmonary/Chest: Effort normal and breath sounds normal. No respiratory distress. She has no wheezes. She has no rales. She exhibits no tenderness.  Abdominal: Soft. Bowel sounds are normal. There is tenderness (patient with diffuse abdominal tenderness most pronounced in the LLQ. Guarding is present). There is no rebound and no guarding.  Musculoskeletal: Normal range of motion. She exhibits no edema or tenderness.  No CVA tenderness.  Neurological: She is alert and oriented to person, place, and time.  Moving all extremities without focal deficit. Sensation fully intact. 2+ distal pulses in all extremities.  Skin: Skin is warm and dry. Capillary refill takes less than 2 seconds. No rash noted. She is not diaphoretic. No erythema.  Psychiatric: She has a normal mood and affect. Her behavior is normal.  Nursing note and vitals  reviewed.    ED Treatments / Results  Labs (all labs ordered are listed, but only abnormal results are displayed) Labs Reviewed  CBC WITH DIFFERENTIAL/PLATELET - Abnormal; Notable for the following:       Result Value   Monocytes Absolute 1.2 (*)    All other components within normal limits  COMPREHENSIVE METABOLIC PANEL - Abnormal; Notable for the following:    Potassium 3.2 (*)    Glucose, Bld 137 (*)    All other components within normal limits  POC OCCULT BLOOD, ED - Abnormal; Notable for the following:    Fecal Occult Bld POSITIVE (*)    All other components within normal limits  C DIFFICILE QUICK SCREEN W PCR REFLEX  GASTROINTESTINAL PANEL BY PCR, STOOL (REPLACES STOOL CULTURE)  LIPASE, BLOOD  PROTIME-INR  APTT  TYPE AND SCREEN    EKG  EKG Interpretation None       Radiology Ct Abdomen Pelvis W Contrast  Result Date: 01/22/2017 CLINICAL DATA:  Abdominal pain, left-sided, and rectal bleeding. Nausea. EXAM: CT ABDOMEN AND PELVIS WITH CONTRAST TECHNIQUE: Multidetector CT imaging of the abdomen and pelvis was performed using the standard protocol following bolus administration of intravenous contrast. CONTRAST:  72 mL Isovue 370 nonionic COMPARISON:  Abdominal ultrasound March 13, 2009 FINDINGS: Lower chest: There is mild scarring in the lung bases. No lung base edema or consolidation. There is a cyst immediately adjacent to the pericardium on the left measuring 1.8 x 1.1 cm. Hepatobiliary: There is a cyst in the lateral segment of the left lobe of the liver measuring 1.3 x 1.0 cm. No other focal liver lesion evident. There is mild fatty infiltration near the fissure for the ligamentum teres. Gallbladder is absent. There is diffuse intrahepatic biliary duct dilatation. The common bile duct measures 11 mm, enlarged. No mass or calculus evident. Pancreas: There is generalized prominence of the pancreatic duct with the pancreatic duct measuring up to 7 mm. No mass or calculus is  evident in the pancreatic duct region. There is no pancreatic mass or pancreatic inflammation appreciable. Spleen: No focal splenic lesions are evident. Adrenals/Urinary Tract: Adrenals appear normal bilaterally. There is scarring in the upper pole of the left kidney. There is no renal mass or hydronephrosis on either side. There is a 5 x 3 mm nonobstructing calculus in the upper pole of the left kidney. There is no ureteral calculus on either  side. Urinary bladder is midline with wall thickness within normal limits. Stomach/Bowel: There is wall thickening involving portions of the sigmoid colon and distal descending colon wall without appreciable diverticular inflammation or mesenteric inflammation. Similar bowel wall thickening is not noted elsewhere. No bowel obstruction. No free air or portal venous air. Vascular/Lymphatic: There is atherosclerotic calcification and in the aorta and common iliac arteries. There is also internal and external iliac artery calcification. No aneurysm evident. Major mesenteric vessels appear patent. No adenopathy is appreciable in abdomen and pelvis. Reproductive: Uterus is absent.  No pelvic mass. Other: Appendix is absent. No abscess or ascites evident in the abdomen or pelvis. Musculoskeletal: There is postoperative change at L5 and S1, as well as in the right sacroiliac joint. There are no blastic or lytic bone lesions. There is no intramuscular or abdominal wall lesion. IMPRESSION: 1. Wall thickening in portions of the descending colon and sigmoid colon, likely due to colitis. Frank diverticulitis not seen. No similar bowel wall thickening seen elsewhere. Given history of rectal bleeding, direct visualization of the colon may well be warranted after treatment for what appears to be left colonic inflammation. 2. Diffuse intrahepatic and extrahepatic biliary duct dilatation as well as generalized pancreatic duct dilatation. Gallbladder absent. No pancreatic or biliary duct mass or  calculus evident. Given the degree of pancreatic duct dilatation, correlation with MR/MRCP nonemergently may be advisable to further evaluate. 3. Scarring upper pole left kidney. Nonobstructing calculus upper left kidney. No hydronephrosis or ureteral calculus on either side. 4.  Extensive aortoiliac atherosclerosis. 5. Gallbladder, uterus, and appendix absent. Postoperative change lower lumbar spine and upper sacrum and right sacroiliac joint. 6.  Apparent small pericardial cyst on the left. Aortic Atherosclerosis (ICD10-I70.0). Electronically Signed   By: Bretta BangWilliam  Woodruff III [name].   On: 01/22/2017 13:49    Procedures Procedures (including critical care time)  Medications Ordered in ED Medications  iopamidol (ISOVUE-300) 61 % injection (not administered)  iopamidol (ISOVUE-300) 61 % injection 75 mL (not administered)  ciprofloxacin (CIPRO) IVPB 400 mg (not administered)  metroNIDAZOLE (FLAGYL) IVPB 500 mg (not administered)  sodium chloride 0.9 % bolus 500 mL (0 mLs Intravenous Stopped 01/22/17 1208)  ondansetron (ZOFRAN) injection 4 mg (4 mg Intravenous Given 01/22/17 1123)  HYDROmorphone (DILAUDID) injection 0.5 mg (0.5 mg Intravenous Given 01/22/17 1123)     Initial Impression / Assessment and Plan / ED Course  I have reviewed the triage vital signs and the nursing notes.  Pertinent labs & imaging results that were available during my care of the patient were reviewed by me and considered in my medical decision making (see chart for details).    Discussed with Dr. Jena Gaussourk who will see patient in consult. Hospitalist to admit. No further bloody bowel movements in the emergency department. Vital signs remain stable.   Final Clinical Impressions(s) / ED Diagnoses   Final diagnoses:  Colitis  Acute GI bleeding    New Prescriptions New Prescriptions   No medications on file     Loren RacerYelverton, Seith Aikey, MD 01/22/17 1429

## 2017-01-23 LAB — CBC
HCT: 38.9 % (ref 36.0–46.0)
Hemoglobin: 12.8 g/dL (ref 12.0–15.0)
MCH: 30.5 pg (ref 26.0–34.0)
MCHC: 32.9 g/dL (ref 30.0–36.0)
MCV: 92.8 fL (ref 78.0–100.0)
Platelets: 171 10*3/uL (ref 150–400)
RBC: 4.19 MIL/uL (ref 3.87–5.11)
RDW: 12.6 % (ref 11.5–15.5)
WBC: 4.8 10*3/uL (ref 4.0–10.5)

## 2017-01-23 LAB — COMPREHENSIVE METABOLIC PANEL
ALT: 12 U/L — ABNORMAL LOW (ref 14–54)
AST: 15 U/L (ref 15–41)
Albumin: 3.1 g/dL — ABNORMAL LOW (ref 3.5–5.0)
Alkaline Phosphatase: 68 U/L (ref 38–126)
Anion gap: 4 — ABNORMAL LOW (ref 5–15)
BUN: <5 mg/dL — ABNORMAL LOW (ref 6–20)
CO2: 30 mmol/L (ref 22–32)
Calcium: 8.5 mg/dL — ABNORMAL LOW (ref 8.9–10.3)
Chloride: 105 mmol/L (ref 101–111)
Creatinine, Ser: 0.61 mg/dL (ref 0.44–1.00)
GFR calc Af Amer: >60 mL/min (ref >60)
GFR calc non Af Amer: >60 mL/min (ref >60)
Glucose, Bld: 113 mg/dL — ABNORMAL HIGH (ref 65–99)
Potassium: 3.9 mmol/L (ref 3.5–5.1)
Sodium: 139 mmol/L (ref 135–145)
Total Bilirubin: 0.5 mg/dL (ref 0.3–1.2)
Total Protein: 5.6 g/dL — ABNORMAL LOW (ref 6.5–8.1)

## 2017-01-23 LAB — GASTROINTESTINAL PANEL BY PCR, STOOL (REPLACES STOOL CULTURE)

## 2017-01-23 LAB — MAGNESIUM: Magnesium: 1.8 mg/dL (ref 1.7–2.4)

## 2017-01-23 LAB — HIV ANTIBODY (ROUTINE TESTING W REFLEX): HIV Screen 4th Generation wRfx: NONREACTIVE

## 2017-01-23 NOTE — Care Management Important Message (Signed)
Important Message  Patient Details  Name: Elizabeth Fowler MRN: [id number] Date of Birth: 11/15/1950   Medicare Important Message Given:  Yes    Malcolm MetroChildress, Davier Tramell Demske, RN 01/23/2017, 1:31 PM

## 2017-01-23 NOTE — Progress Notes (Signed)
PROGRESS NOTE  Elizabeth Fowler ZOX:[id number] DOB: October 22, 1950 DOA: 01/22/2017 PCP: [name]  Brief History:   66 y.o. female with medical history of COPD, GERD, tobacco abuse, hyperlipidemia, and chronic low back pain presented with approximate 4 days of abdominal pain with associated hematochezia and diarrhea. The patient states that this all began around 01/18/2017.  She denies any recent travels, eating any unusual or undercooked foods, or sick contacts. She denies any recent antibiotics. She states that she was placed on Prilosec and atorvastatin approximately 2-3 weeks prior to this admission. She denies any other new medications or over-the-counter supplements. On the day of admission, the patient stated that she had up to 15 loose bowel movements with bloody stool. She had some nausea without emesis. She denies any fevers, chills, chest pain, palpitations, dizziness, worsening shortness of breath, hemoptysis. The patient states that she has a chronic cough and shortness of breath which are unchanged. She denies any dysuria, hematuria, vaginal bleeding.  The patient states that she had a colonoscopy approximately 4-5 years ago removing 2 polyps.  In the emergency department, CT of the abdomen and pelvis revealed diffuse intrahepatic ductal dilatation with generalized prominence of the pancreatic duct. There was no pancreatic mass or inflammation. There was also wall thickening of the sigmoid and descending colon. The patient was started on Cipro and Flagyl.   Assessment/Plan: Acute colitis -Check C. Difficile--neg -Stool pathogen panel--neg -Continue Cipro and Flagyl -Continue IV fluids-->improving clinically -GI consult appreciated -advanced diet  Hematochezia -Secondary to colitis -Hemoglobin largely stable--drop due to dilution -Baseline hemoglobin 15 -Monitor hemoglobin although expect mild drop due to dilution -Check coags--unremarkable -no  further hematochezia since admission  COPD with continued tobacco abuse -Presently stable on room air -Tobacco cessation discussed  Anxiety -Continue home dose alprazolam  Chronic back pain -Continue home dose gabapentin, and hydrocodone  Hypokalemia -Secondary to GI loss -Replete -Check magnesium--1.8  Biliary ductal dilatation -Likely physiologic secondary to cholecystectomy -LFTs are normal -Monitor LFTs   Disposition Plan:   Home 01/24/17 if stable Family Communication:   Family at bedside  Consultants:  GI  Code Status:  FULL / DNR  DVT Prophylaxis:  SCDs   Procedures: As Listed in Progress Note Above  Antibiotics: cipro 7/5>>> Metronidazole 7/5    Subjective: Patient states that her abdominal pain is significantly improved. She has not had any further hematochezia although she has had 4 loose bowel movements since admission. She was able to tolerate a diet at this time. She denies any fevers, chills, chest pain, shortness breath, vomiting, dysuria, hematuria.  Objective: Vitals:   01/22/17 2110 01/23/17 0633 01/23/17 1428 01/23/17 1553  BP: 138/66 140/62 (!) 150/62   Pulse: 84 86 70   Resp: 16 16 17    Temp: 98.3 F (36.8 C) 98.2 F (36.8 C) 98.1 F (36.7 C)   TempSrc: Oral Oral Oral   SpO2: 98% 97% 100% 98%  Weight:      Height:        Intake/Output Summary (Last 24 hours) at 01/23/17 1735 Last data filed at 01/23/17 1700  Gross per 24 hour  Intake             2890 ml  Output                0 ml  Net             2890 ml   Weight change:  Exam:   General:  Pt is alert, follows commands appropriately, not in acute distress  HEENT: No icterus, No thrush, No neck mass, [place]/AT  Cardiovascular: RRR, S1/S2, no rubs, no gallops  Respiratory: CTA bilaterally, no wheezing, no crackles, no rhonchi  Abdomen: Soft/+BS, non tender, non distended, no guarding  Extremities: No edema, No lymphangitis, No petechiae, No rashes, no  synovitis   Data Reviewed: I have personally reviewed following labs and imaging studies Basic Metabolic Panel:  Recent Labs Lab 01/22/17 1049 01/23/17 0633  NA 137 139  K 3.2* 3.9  CL 101 105  CO2 25 30  GLUCOSE 137* 113*  BUN 11 <5*  CREATININE 0.56 0.61  CALCIUM 9.1 8.5*  MG  --  1.8   Liver Function Tests:  Recent Labs Lab 01/22/17 1049 01/23/17 0633  AST 16 15  ALT 14 12*  ALKPHOS 88 68  BILITOT 0.7 0.5  PROT 6.7 5.6*  ALBUMIN 3.8 3.1*    Recent Labs Lab 01/22/17 1049  LIPASE 15   No results for input(s): AMMONIA in the last 168 hours. Coagulation Profile:  Recent Labs Lab 01/22/17 1049  INR 0.95   CBC:  Recent Labs Lab 01/22/17 1049 01/23/17 0633  WBC 8.7 4.8  NEUTROABS 5.9  --   HGB 14.9 12.8  HCT 44.7 38.9  MCV 91.2 92.8  PLT 203 171   Cardiac Enzymes: No results for input(s): CKTOTAL, CKMB, CKMBINDEX, TROPONINI in the last 168 hours. BNP: Invalid input(s): POCBNP CBG: No results for input(s): GLUCAP in the last 168 hours. HbA1C: No results for input(s): HGBA1C in the last 72 hours. Urine analysis:    Component Value Date/Time   COLORURINE YELLOW 02/21/2014 2210   APPEARANCEUR CLOUDY (A) 02/21/2014 2210   LABSPEC 1.009 02/21/2014 2210   PHURINE 6.5 02/21/2014 2210   GLUCOSEU NEGATIVE 02/21/2014 2210   HGBUR NEGATIVE 02/21/2014 2210   BILIRUBINUR NEGATIVE 02/21/2014 2210   KETONESUR NEGATIVE 02/21/2014 2210   PROTEINUR NEGATIVE 02/21/2014 2210   UROBILINOGEN 1.0 02/21/2014 2210   NITRITE POSITIVE (A) 02/21/2014 2210   LEUKOCYTESUR NEGATIVE 02/21/2014 2210   Sepsis Labs: @LABRCNTIP (procalcitonin:4,lacticidven:4) ) Recent Results (from the past 240 hour(s))  C difficile quick scan w PCR reflex     Status: None   Collection Time: 01/22/17  6:00 PM  Result Value Ref Range Status   C Diff antigen NEGATIVE NEGATIVE Final   C Diff toxin NEGATIVE NEGATIVE Final   C Diff interpretation No C. difficile detected.  Final   Gastrointestinal Panel by PCR , Stool     Status: None   Collection Time: 01/22/17  6:00 PM  Result Value Ref Range Status   Campylobacter species NOT DETECTED NOT DETECTED Final   Plesimonas shigelloides NOT DETECTED NOT DETECTED Final   Salmonella species NOT DETECTED NOT DETECTED Final   Yersinia enterocolitica NOT DETECTED NOT DETECTED Final   Vibrio species NOT DETECTED NOT DETECTED Final   Vibrio cholerae NOT DETECTED NOT DETECTED Final   Enteroaggregative E coli (EAEC) NOT DETECTED NOT DETECTED Final   Enteropathogenic E coli (EPEC) NOT DETECTED NOT DETECTED Final   Enterotoxigenic E coli (ETEC) NOT DETECTED NOT DETECTED Final   Shiga like toxin producing E coli (STEC) NOT DETECTED NOT DETECTED Final   Shigella/Enteroinvasive E coli (EIEC) NOT DETECTED NOT DETECTED Final   Cryptosporidium NOT DETECTED NOT DETECTED Final   Cyclospora cayetanensis NOT DETECTED NOT DETECTED Final   Entamoeba histolytica NOT DETECTED NOT DETECTED Final   Giardia lamblia NOT DETECTED  NOT DETECTED Final   Adenovirus F40/41 NOT DETECTED NOT DETECTED Final   Astrovirus NOT DETECTED NOT DETECTED Final   Norovirus GI/GII NOT DETECTED NOT DETECTED Final   Rotavirus A NOT DETECTED NOT DETECTED Final   Sapovirus (I, II, IV, and V) NOT DETECTED NOT DETECTED Final     Scheduled Meds: . gabapentin  300 mg Oral TID  . pantoprazole  40 mg Oral Daily  . pravastatin  10 mg Oral Daily   Continuous Infusions: . 0.9 % NaCl with KCl 20 mEq / L 75 mL/hr at 01/23/17 1647  . ciprofloxacin 400 mg (01/23/17 1659)  . metronidazole 500 mg (01/23/17 1659)    Procedures/Studies: Ct Abdomen Pelvis W Contrast  Result Date: 01/22/2017 CLINICAL DATA:  Abdominal pain, left-sided, and rectal bleeding. Nausea. EXAM: CT ABDOMEN AND PELVIS WITH CONTRAST TECHNIQUE: Multidetector CT imaging of the abdomen and pelvis was performed using the standard protocol following bolus administration of intravenous contrast. CONTRAST:  72 mL  Isovue 370 nonionic COMPARISON:  Abdominal ultrasound March 13, 2009 FINDINGS: Lower chest: There is mild scarring in the lung bases. No lung base edema or consolidation. There is a cyst immediately adjacent to the pericardium on the left measuring 1.8 x 1.1 cm. Hepatobiliary: There is a cyst in the lateral segment of the left lobe of the liver measuring 1.3 x 1.0 cm. No other focal liver lesion evident. There is mild fatty infiltration near the fissure for the ligamentum teres. Gallbladder is absent. There is diffuse intrahepatic biliary duct dilatation. The common bile duct measures 11 mm, enlarged. No mass or calculus evident. Pancreas: There is generalized prominence of the pancreatic duct with the pancreatic duct measuring up to 7 mm. No mass or calculus is evident in the pancreatic duct region. There is no pancreatic mass or pancreatic inflammation appreciable. Spleen: No focal splenic lesions are evident. Adrenals/Urinary Tract: Adrenals appear normal bilaterally. There is scarring in the upper pole of the left kidney. There is no renal mass or hydronephrosis on either side. There is a 5 x 3 mm nonobstructing calculus in the upper pole of the left kidney. There is no ureteral calculus on either side. Urinary bladder is midline with wall thickness within normal limits. Stomach/Bowel: There is wall thickening involving portions of the sigmoid colon and distal descending colon wall without appreciable diverticular inflammation or mesenteric inflammation. Similar bowel wall thickening is not noted elsewhere. No bowel obstruction. No free air or portal venous air. Vascular/Lymphatic: There is atherosclerotic calcification and in the aorta and common iliac arteries. There is also internal and external iliac artery calcification. No aneurysm evident. Major mesenteric vessels appear patent. No adenopathy is appreciable in abdomen and pelvis. Reproductive: Uterus is absent.  No pelvic mass. Other: Appendix is absent.  No abscess or ascites evident in the abdomen or pelvis. Musculoskeletal: There is postoperative change at L5 and S1, as well as in the right sacroiliac joint. There are no blastic or lytic bone lesions. There is no intramuscular or abdominal wall lesion. IMPRESSION: 1. Wall thickening in portions of the descending colon and sigmoid colon, likely due to colitis. Frank diverticulitis not seen. No similar bowel wall thickening seen elsewhere. Given history of rectal bleeding, direct visualization of the colon may well be warranted after treatment for what appears to be left colonic inflammation. 2. Diffuse intrahepatic and extrahepatic biliary duct dilatation as well as generalized pancreatic duct dilatation. Gallbladder absent. No pancreatic or biliary duct mass or calculus evident. Given the degree of pancreatic  duct dilatation, correlation with MR/MRCP nonemergently may be advisable to further evaluate. 3. Scarring upper pole left kidney. Nonobstructing calculus upper left kidney. No hydronephrosis or ureteral calculus on either side. 4.  Extensive aortoiliac atherosclerosis. 5. Gallbladder, uterus, and appendix absent. Postoperative change lower lumbar spine and upper sacrum and right sacroiliac joint. 6.  Apparent small pericardial cyst on the left. Aortic Atherosclerosis (ICD10-I70.0). Electronically Signed   By: Bretta Bang III [name].   On: 01/22/2017 13:49    Aloysious Vangieson, DO  [facility] Pager [phone]  If 7PM-7AM, please contact night-coverage [email or web address] Password [email or web address] 01/23/2017, 5:35 PM   LOS: 1 day

## 2017-01-23 NOTE — Progress Notes (Signed)
Subjective: "Compared to yesterday I feel 100% better!" Had a large brown bowel movement last night, no blood noted. No bowel movement since. Denies abdominal pain, N/V. No other upper or lower GI symptoms.  Objective: Vital signs in last 24 hours: Temp:  [97.5 F (36.4 C)-98.3 F (36.8 C)] 98.2 F (36.8 C) (07/06 40980633) Pulse Rate:  [78-86] 86 (07/06 0633) Resp:  [15-18] 16 (07/06 0633) BP: (109-161)/(56-139) 140/62 (07/06 0633) SpO2:  [91 %-100 %] 97 % (07/06 11910633) Weight:  [116 lb (52.6 kg)] 116 lb (52.6 kg) (07/05 1639) Last BM Date: 01/22/17 General:   Alert and oriented, pleasant Head:  Normocephalic and atraumatic. Eyes:  No icterus, sclera clear. Conjuctiva pink.  Heart:  S1, S2 present, no murmurs noted.  Lungs: Clear to auscultation bilaterally, without wheezing, rales, or rhonchi.  Abdomen:  Bowel sounds present, soft, non-tender, non-distended. No HSM or hernias noted. No rebound or guarding. No masses appreciated  Msk:  Symmetrical without gross deformities. Pulses:  Normal bilateral DP pulses noted. Extremities:  Without clubbing or edema. Neurologic:  Alert and  oriented x4;  grossly normal neurologically. Skin:  Warm and dry, intact without significant lesions.  Psych:  Alert and cooperative. Normal mood and affect.  Intake/Output from previous day: 07/05 0701 - 07/06 0700 In: 1870 [I.V.:970; IV Piggyback:900] Out: -  Intake/Output this shift: No intake/output data recorded.  Lab Results:  Recent Labs  01/22/17 1049 01/23/17 0633  WBC 8.7 4.8  HGB 14.9 12.8  HCT 44.7 38.9  PLT 203 171   BMET  Recent Labs  01/22/17 1049 01/23/17 0633  NA 137 139  K 3.2* 3.9  CL 101 105  CO2 25 30  GLUCOSE 137* 113*  BUN 11 <5*  CREATININE 0.56 0.61  CALCIUM 9.1 8.5*   LFT  Recent Labs  01/22/17 1049 01/23/17 0633  PROT 6.7 5.6*  ALBUMIN 3.8 3.1*  AST 16 15  ALT 14 12*  ALKPHOS 88 68  BILITOT 0.7 0.5   PT/INR  Recent Labs  01/22/17 1049   LABPROT 12.7  INR 0.95   Hepatitis Panel No results for input(s): HEPBSAG, HCVAB, HEPAIGM, HEPBIGM in the last 72 hours.   Studies/Results: Ct Abdomen Pelvis W Contrast  Result Date: 01/22/2017 CLINICAL DATA:  Abdominal pain, left-sided, and rectal bleeding. Nausea. EXAM: CT ABDOMEN AND PELVIS WITH CONTRAST TECHNIQUE: Multidetector CT imaging of the abdomen and pelvis was performed using the standard protocol following bolus administration of intravenous contrast. CONTRAST:  72 mL Isovue 370 nonionic COMPARISON:  Abdominal ultrasound March 13, 2009 FINDINGS: Lower chest: There is mild scarring in the lung bases. No lung base edema or consolidation. There is a cyst immediately adjacent to the pericardium on the left measuring 1.8 x 1.1 cm. Hepatobiliary: There is a cyst in the lateral segment of the left lobe of the liver measuring 1.3 x 1.0 cm. No other focal liver lesion evident. There is mild fatty infiltration near the fissure for the ligamentum teres. Gallbladder is absent. There is diffuse intrahepatic biliary duct dilatation. The common bile duct measures 11 mm, enlarged. No mass or calculus evident. Pancreas: There is generalized prominence of the pancreatic duct with the pancreatic duct measuring up to 7 mm. No mass or calculus is evident in the pancreatic duct region. There is no pancreatic mass or pancreatic inflammation appreciable. Spleen: No focal splenic lesions are evident. Adrenals/Urinary Tract: Adrenals appear normal bilaterally. There is scarring in the upper pole of the left kidney. There is  no renal mass or hydronephrosis on either side. There is a 5 x 3 mm nonobstructing calculus in the upper pole of the left kidney. There is no ureteral calculus on either side. Urinary bladder is midline with wall thickness within normal limits. Stomach/Bowel: There is wall thickening involving portions of the sigmoid colon and distal descending colon wall without appreciable diverticular  inflammation or mesenteric inflammation. Similar bowel wall thickening is not noted elsewhere. No bowel obstruction. No free air or portal venous air. Vascular/Lymphatic: There is atherosclerotic calcification and in the aorta and common iliac arteries. There is also internal and external iliac artery calcification. No aneurysm evident. Major mesenteric vessels appear patent. No adenopathy is appreciable in abdomen and pelvis. Reproductive: Uterus is absent.  No pelvic mass. Other: Appendix is absent. No abscess or ascites evident in the abdomen or pelvis. Musculoskeletal: There is postoperative change at L5 and S1, as well as in the right sacroiliac joint. There are no blastic or lytic bone lesions. There is no intramuscular or abdominal wall lesion. IMPRESSION: 1. Wall thickening in portions of the descending colon and sigmoid colon, likely due to colitis. Frank diverticulitis not seen. No similar bowel wall thickening seen elsewhere. Given history of rectal bleeding, direct visualization of the colon may well be warranted after treatment for what appears to be left colonic inflammation. 2. Diffuse intrahepatic and extrahepatic biliary duct dilatation as well as generalized pancreatic duct dilatation. Gallbladder absent. No pancreatic or biliary duct mass or calculus evident. Given the degree of pancreatic duct dilatation, correlation with MR/MRCP nonemergently may be advisable to further evaluate. 3. Scarring upper pole left kidney. Nonobstructing calculus upper left kidney. No hydronephrosis or ureteral calculus on either side. 4.  Extensive aortoiliac atherosclerosis. 5. Gallbladder, uterus, and appendix absent. Postoperative change lower lumbar spine and upper sacrum and right sacroiliac joint. 6.  Apparent small pericardial cyst on the left. Aortic Atherosclerosis (ICD10-I70.0). Electronically Signed   By: Bretta Bang III [name].   On: 01/22/2017 13:49    Assessment: 66 year old female presenting to  the ER with acute onset diarrhea with blood in the stool, progressive abdominal pain over the previous 4 days leaing up to ER presentation. At baseline she has predominant constipation in the setting of chronic narcotic use. Last colonoscopy in 2010 which noted anal canal hemorrhoids, submucosal petechiae, sigmoid colon, left-sided and transverse diverticula, small hepatic flexure polyp removed in not recovered, rectal polyp hyperplastic.   CT with wall thickening in portions of the descending colon and sigmoid colon likely due to colitis. No overt diverticulitis. She notes decreased blood in the stool over the last couple episodes. She was having 10-15 stools a day but after her large bowel mvoement last night ahs not had any further bowel movements (almost 12 hours). Suspected infectious etiology, IBD and malignancy less likely. Mesenteric ischemia remains in differential but at least major mesenteric vasculature remains patent.    In addition she has diffuse intrahepatic and extra hepatic biliary duct dilation as well as pancreatic duct dilation. LFTs and lipase are normal. Related to chronic narcotic use. May require further imaging at a later date.  Today she feels "100% better". Tolerating her diet well, is asking for a regular diet (on clear liquids at this point.) CDiff quicjk scan negative, GI path panel pending. CBC this morning shows normal at 12.8 with a decline since yesterday but likely rehydration effect in the setting of several days of diarrhea. No leucocytosis. CMP with normal potassion (improved since yesterday), normal Cr.  Low calcium, normal Mg.   Plan: 1. Follow for GI path panel results 2. Continue abx until panel results 3. Advance diet to bland 4. Monitor for recurrent GI bleed 5. Monitor H/H for any further decline in hgb. 6. Supportive measures   Thank you for allowing [place] to participate in the care of Elizabeth Crumble, DNP, AGNP-C Adult & Gerontological Nurse  Practitioner [facility]    LOS: 1 day    01/23/2017, 9:39 AM

## 2017-01-23 NOTE — Care Management Note (Signed)
Case Management Note  Patient Details  Name: Elizabeth JarvisWanda H Fowler MRN: [id number] Date of Birth: 12/21/1950  Subjective/Objective:                  Pt admitted from home with hematochezia. She lives with husband. She has PCP, drives herself to appointments. She has insurance with drug coverage. She has no HH or DME needs. She communicates no needs.   Action/Plan: Anticipate return home with self care. No CM needs.   Expected Discharge Date:  01/24/17               Expected Discharge Plan:  Home/Self Care  In-House Referral:  NA  Discharge planning Services  CM Consult  Post Acute Care Choice:  NA Choice offered to:  NA  Status of Service:  Completed, signed off  Malcolm MetroChildress, Zamere Pasternak Demske, RN 01/23/2017, 1:36 PM

## 2017-01-24 ENCOUNTER — Telehealth: Payer: Self-pay | Admitting: Gastroenterology

## 2017-01-24 LAB — BASIC METABOLIC PANEL
Anion gap: 9 (ref 5–15)
BUN: <5 mg/dL — ABNORMAL LOW (ref 6–20)
CO2: 28 mmol/L (ref 22–32)
Calcium: 9.2 mg/dL (ref 8.9–10.3)
Chloride: 102 mmol/L (ref 101–111)
Creatinine, Ser: 0.55 mg/dL (ref 0.44–1.00)
GFR calc Af Amer: >60 mL/min (ref >60)
GFR calc non Af Amer: >60 mL/min (ref >60)
Glucose, Bld: 118 mg/dL — ABNORMAL HIGH (ref 65–99)
Potassium: 3.4 mmol/L — ABNORMAL LOW (ref 3.5–5.1)
Sodium: 139 mmol/L (ref 135–145)

## 2017-01-24 LAB — CBC
HCT: 41.8 % (ref 36.0–46.0)
Hemoglobin: 14 g/dL (ref 12.0–15.0)
MCH: 30.3 pg (ref 26.0–34.0)
MCHC: 33.5 g/dL (ref 30.0–36.0)
MCV: 90.5 fL (ref 78.0–100.0)
Platelets: 218 10*3/uL (ref 150–400)
RBC: 4.62 MIL/uL (ref 3.87–5.11)
RDW: 12.2 % (ref 11.5–15.5)
WBC: 6.1 10*3/uL (ref 4.0–10.5)

## 2017-01-24 MED ORDER — METRONIDAZOLE 500 MG PO TABS: 500.0000 mg | ORAL_TABLET | Freq: Three times a day (TID) | ORAL | Status: DC

## 2017-01-24 MED ORDER — METRONIDAZOLE 500 MG PO TABS: 500.0000 mg | ORAL_TABLET | Freq: Three times a day (TID) | ORAL | 0 refills | Status: DC

## 2017-01-24 MED ORDER — CIPROFLOXACIN HCL 500 MG PO TABS
500.0000 mg | ORAL_TABLET | Freq: Two times a day (BID) | ORAL | 0 refills | Status: DC
Start: 2017-01-24 — End: 2017-03-19

## 2017-01-24 MED ORDER — POTASSIUM CHLORIDE CRYS ER 20 MEQ PO TBCR
20.0000 meq | EXTENDED_RELEASE_TABLET | Freq: Once | ORAL | Status: AC
  Administered 2017-01-24: 20 meq via ORAL
  Filled 2017-01-24: qty 1

## 2017-01-24 MED ORDER — CIPROFLOXACIN HCL 250 MG PO TABS: 500.0000 mg | ORAL_TABLET | Freq: Two times a day (BID) | ORAL | Status: DC

## 2017-01-24 NOTE — Progress Notes (Signed)
Patient IV removed, tolerated well, Discharge instructions given to patient at bedside.

## 2017-01-24 NOTE — Telephone Encounter (Signed)
Please have patient return as outpatient follow-up in 6 weeks.

## 2017-01-24 NOTE — Discharge Summary (Signed)
Physician Discharge Summary  Elizabeth Fowler:[id number] DOB: 1951-04-13 DOA: 01/22/2017  PCP: [name]  Admit date: 01/22/2017 Discharge date: 01/24/2017  Admitted From: Home Disposition:  Home   Recommendations for Outpatient Follow-up:  1. Follow up with PCP in 1-2 weeks 2. Please obtain BMP/CBC in one week     Discharge Condition: Stable CODE STATUS: FULL Diet recommendation:   Brief/Interim Summary: 66 y.o.femalewith medical history of COPD, GERD,tobacco abuse, hyperlipidemia, and chronic low back pain presented with approximate 4 days of abdominal pain with associated hematochezia and diarrhea.The patient states that this all began around 01/18/2017. She denies any recent travels, eating any unusual or undercooked foods, or sick contacts. She denies any recent antibiotics. She states that she was placed on Prilosec and atorvastatin approximately 2-3 weeks prior to this admission. She denies any other new medications or over-the-counter supplements. On the day of admission, the patient stated that she had up to 15 loose bowel movements with bloody stool. She had some nausea without emesis. She denies any fevers, chills, chest pain, palpitations, dizziness, worsening shortness of breath, hemoptysis. The patient states that she has a chronic cough and shortness of breath which are unchanged. She denies any dysuria, hematuria, vaginal bleeding. The patient states that she had a colonoscopy approximately 4-5 years ago removing 2 polyps.  In the emergency department, CT of the abdomen and pelvis revealed diffuse intrahepatic ductal dilatation with generalized prominence of the pancreatic duct. There was no pancreatic mass or inflammation. There was also wall thickening of the sigmoid and descending colon. The patient was started on Cipro and Flagyl with clinical improvement.  Her diet was advanced which she tolerated.   Discharge Diagnoses:  Acute  colitis -Check C. Difficile--neg -Stool pathogen panel--neg -Continue Cipro and Flagyl--home with 8 more days to complete 10 days of therapy -Continue IV fluids-->improving clinically -GI consult appreciated -advanced diet-->tolerated  Hematochezia -Secondary to colitis -Hemoglobin largely stable--drop due to dilution -Baseline hemoglobin 15 -Hgb 14.0 on day of discharge -Check coags--unremarkable -no further hematochezia since admission  COPD with continued tobacco abuse -Presently stable on room air -Tobacco cessation discussed  Anxiety -Continue home dose alprazolam  Chronic back pain -Continue home dose gabapentin, and hydrocodone  Hypokalemia -Secondary to GI loss -Repleted -Check magnesium--1.8  Biliary ductal dilatation -Likely physiologic secondary to cholecystectomy -LFTs are normal -Monitor LFTs   Discharge Instructions  Discharge Instructions    Diet - low sodium heart healthy    Complete by:  As directed    Increase activity slowly    Complete by:  As directed      Allergies as of 01/24/2017      Reactions   Bee Venom Anaphylaxis   Codeine Anaphylaxis, Swelling   Penicillins Anaphylaxis, Swelling, Other (See Comments)   Has patient had a PCN reaction causing immediate rash, facial/tongue/throat swelling, SOB or lightheadedness with hypotension: No Has patient had a PCN reaction causing severe rash involving mucus membranes or skin necrosis: Yes Has patient had a PCN reaction that required hospitalization: No Has patient had a PCN reaction occurring within the last 10 years: No If all of the above answers are "NO", then may proceed with Cephalosporin use.      Medication List    TAKE these medications   albuterol 108 (90 Base) MCG/ACT inhaler Commonly known as:  PROVENTIL HFA;VENTOLIN HFA Inhale 2 puffs into the lungs every 6 (six) hours as needed for wheezing or shortness of breath.   ASPERCREME MAX ROLL-ON 16 %  Liqd Generic drug:   Menthol (Topical Analgesic) Apply 1 application topically as needed (for pain).   ciprofloxacin 500 MG tablet Commonly known as:  CIPRO Take 1 tablet (500 mg total) by mouth 2 (two) times daily.   cyclobenzaprine 10 MG tablet Commonly known as:  FLEXERIL Take 10 mg by mouth 3 (three) times daily as needed for muscle spasms.   EPIPEN 2-PAK 0.3 mg/0.3 mL Soaj injection Generic drug:  EPINEPHrine Inject 0.3 mg into the muscle once.   gabapentin 300 MG capsule Commonly known as:  NEURONTIN Take 300 mg by mouth 3 (three) times daily.   HYDROcodone-acetaminophen 10-325 MG tablet Commonly known as:  NORCO Take 1 tablet by mouth every 6 (six) hours as needed. What changed:  reasons to take this  additional instructions  Another medication with the same name was removed. Continue taking this medication, and follow the directions you see here.   metroNIDAZOLE 500 MG tablet Commonly known as:  FLAGYL Take 1 tablet (500 mg total) by mouth every 8 (eight) hours.   omeprazole 20 MG capsule Commonly known as:  PRILOSEC Take 1 po BID x 2 weeks then once a day   pravastatin 10 MG tablet Commonly known as:  PRAVACHOL Take 10 mg by mouth daily.       Allergies  Allergen Reactions  . Bee Venom Anaphylaxis  . Codeine Anaphylaxis and Swelling  . Penicillins Anaphylaxis, Swelling and Other (See Comments)    Has patient had a PCN reaction causing immediate rash, facial/tongue/throat swelling, SOB or lightheadedness with hypotension: No Has patient had a PCN reaction causing severe rash involving mucus membranes or skin necrosis: Yes Has patient had a PCN reaction that required hospitalization: No Has patient had a PCN reaction occurring within the last 10 years: No If all of the above answers are "NO", then may proceed with Cephalosporin use.    Consultations:  GI--Rourk   Procedures/Studies: Ct Abdomen Pelvis W Contrast  Result Date: 01/22/2017 CLINICAL DATA:  Abdominal pain,  left-sided, and rectal bleeding. Nausea. EXAM: CT ABDOMEN AND PELVIS WITH CONTRAST TECHNIQUE: Multidetector CT imaging of the abdomen and pelvis was performed using the standard protocol following bolus administration of intravenous contrast. CONTRAST:  72 mL Isovue 370 nonionic COMPARISON:  Abdominal ultrasound March 13, 2009 FINDINGS: Lower chest: There is mild scarring in the lung bases. No lung base edema or consolidation. There is a cyst immediately adjacent to the pericardium on the left measuring 1.8 x 1.1 cm. Hepatobiliary: There is a cyst in the lateral segment of the left lobe of the liver measuring 1.3 x 1.0 cm. No other focal liver lesion evident. There is mild fatty infiltration near the fissure for the ligamentum teres. Gallbladder is absent. There is diffuse intrahepatic biliary duct dilatation. The common bile duct measures 11 mm, enlarged. No mass or calculus evident. Pancreas: There is generalized prominence of the pancreatic duct with the pancreatic duct measuring up to 7 mm. No mass or calculus is evident in the pancreatic duct region. There is no pancreatic mass or pancreatic inflammation appreciable. Spleen: No focal splenic lesions are evident. Adrenals/Urinary Tract: Adrenals appear normal bilaterally. There is scarring in the upper pole of the left kidney. There is no renal mass or hydronephrosis on either side. There is a 5 x 3 mm nonobstructing calculus in the upper pole of the left kidney. There is no ureteral calculus on either side. Urinary bladder is midline with wall thickness within normal limits. Stomach/Bowel: There is wall thickening  involving portions of the sigmoid colon and distal descending colon wall without appreciable diverticular inflammation or mesenteric inflammation. Similar bowel wall thickening is not noted elsewhere. No bowel obstruction. No free air or portal venous air. Vascular/Lymphatic: There is atherosclerotic calcification and in the aorta and common iliac  arteries. There is also internal and external iliac artery calcification. No aneurysm evident. Major mesenteric vessels appear patent. No adenopathy is appreciable in abdomen and pelvis. Reproductive: Uterus is absent.  No pelvic mass. Other: Appendix is absent. No abscess or ascites evident in the abdomen or pelvis. Musculoskeletal: There is postoperative change at L5 and S1, as well as in the right sacroiliac joint. There are no blastic or lytic bone lesions. There is no intramuscular or abdominal wall lesion. IMPRESSION: 1. Wall thickening in portions of the descending colon and sigmoid colon, likely due to colitis. Frank diverticulitis not seen. No similar bowel wall thickening seen elsewhere. Given history of rectal bleeding, direct visualization of the colon may well be warranted after treatment for what appears to be left colonic inflammation. 2. Diffuse intrahepatic and extrahepatic biliary duct dilatation as well as generalized pancreatic duct dilatation. Gallbladder absent. No pancreatic or biliary duct mass or calculus evident. Given the degree of pancreatic duct dilatation, correlation with MR/MRCP nonemergently may be advisable to further evaluate. 3. Scarring upper pole left kidney. Nonobstructing calculus upper left kidney. No hydronephrosis or ureteral calculus on either side. 4.  Extensive aortoiliac atherosclerosis. 5. Gallbladder, uterus, and appendix absent. Postoperative change lower lumbar spine and upper sacrum and right sacroiliac joint. 6.  Apparent small pericardial cyst on the left. Aortic Atherosclerosis (ICD10-I70.0). Electronically Signed   By: Bretta BangWilliam  Woodruff III [name].   On: 01/22/2017 13:49        Discharge Exam: Vitals:   01/24/17 0544 01/24/17 0828  BP: 133/61   Pulse: 70 81  Resp: 15   Temp: 98.3 F (36.8 C)    Vitals:   01/23/17 1428 01/23/17 1553 01/24/17 0544 01/24/17 0828  BP: (!) 150/62  133/61   Pulse: 70  70 81  Resp: 17  15   Temp: 98.1 F (36.7 C)   98.3 F (36.8 C)   TempSrc: Oral  Oral   SpO2: 100% 98% 98% 97%  Weight:      Height:        General: Pt is alert, awake, not in acute distress Cardiovascular: RRR, S1/S2 +, no rubs, no gallops Respiratory: CTA bilaterally, no wheezing, no rhonchi Abdominal: Soft, NT, ND, bowel sounds + Extremities: no edema, no cyanosis   The results of significant diagnostics from this hospitalization (including imaging, microbiology, ancillary and laboratory) are listed below for reference.    Significant Diagnostic Studies: Ct Abdomen Pelvis W Contrast  Result Date: 01/22/2017 CLINICAL DATA:  Abdominal pain, left-sided, and rectal bleeding. Nausea. EXAM: CT ABDOMEN AND PELVIS WITH CONTRAST TECHNIQUE: Multidetector CT imaging of the abdomen and pelvis was performed using the standard protocol following bolus administration of intravenous contrast. CONTRAST:  72 mL Isovue 370 nonionic COMPARISON:  Abdominal ultrasound March 13, 2009 FINDINGS: Lower chest: There is mild scarring in the lung bases. No lung base edema or consolidation. There is a cyst immediately adjacent to the pericardium on the left measuring 1.8 x 1.1 cm. Hepatobiliary: There is a cyst in the lateral segment of the left lobe of the liver measuring 1.3 x 1.0 cm. No other focal liver lesion evident. There is mild fatty infiltration near the fissure for the ligamentum teres. Gallbladder is  absent. There is diffuse intrahepatic biliary duct dilatation. The common bile duct measures 11 mm, enlarged. No mass or calculus evident. Pancreas: There is generalized prominence of the pancreatic duct with the pancreatic duct measuring up to 7 mm. No mass or calculus is evident in the pancreatic duct region. There is no pancreatic mass or pancreatic inflammation appreciable. Spleen: No focal splenic lesions are evident. Adrenals/Urinary Tract: Adrenals appear normal bilaterally. There is scarring in the upper pole of the left kidney. There is no renal mass  or hydronephrosis on either side. There is a 5 x 3 mm nonobstructing calculus in the upper pole of the left kidney. There is no ureteral calculus on either side. Urinary bladder is midline with wall thickness within normal limits. Stomach/Bowel: There is wall thickening involving portions of the sigmoid colon and distal descending colon wall without appreciable diverticular inflammation or mesenteric inflammation. Similar bowel wall thickening is not noted elsewhere. No bowel obstruction. No free air or portal venous air. Vascular/Lymphatic: There is atherosclerotic calcification and in the aorta and common iliac arteries. There is also internal and external iliac artery calcification. No aneurysm evident. Major mesenteric vessels appear patent. No adenopathy is appreciable in abdomen and pelvis. Reproductive: Uterus is absent.  No pelvic mass. Other: Appendix is absent. No abscess or ascites evident in the abdomen or pelvis. Musculoskeletal: There is postoperative change at L5 and S1, as well as in the right sacroiliac joint. There are no blastic or lytic bone lesions. There is no intramuscular or abdominal wall lesion. IMPRESSION: 1. Wall thickening in portions of the descending colon and sigmoid colon, likely due to colitis. Frank diverticulitis not seen. No similar bowel wall thickening seen elsewhere. Given history of rectal bleeding, direct visualization of the colon may well be warranted after treatment for what appears to be left colonic inflammation. 2. Diffuse intrahepatic and extrahepatic biliary duct dilatation as well as generalized pancreatic duct dilatation. Gallbladder absent. No pancreatic or biliary duct mass or calculus evident. Given the degree of pancreatic duct dilatation, correlation with MR/MRCP nonemergently may be advisable to further evaluate. 3. Scarring upper pole left kidney. Nonobstructing calculus upper left kidney. No hydronephrosis or ureteral calculus on either side. 4.  Extensive  aortoiliac atherosclerosis. 5. Gallbladder, uterus, and appendix absent. Postoperative change lower lumbar spine and upper sacrum and right sacroiliac joint. 6.  Apparent small pericardial cyst on the left. Aortic Atherosclerosis (ICD10-I70.0). Electronically Signed   By: Bretta Bang III [name].   On: 01/22/2017 13:49     Microbiology: Recent Results (from the past 240 hour(s))  C difficile quick scan w PCR reflex     Status: None   Collection Time: 01/22/17  6:00 PM  Result Value Ref Range Status   C Diff antigen NEGATIVE NEGATIVE Final   C Diff toxin NEGATIVE NEGATIVE Final   C Diff interpretation No C. difficile detected.  Final  Gastrointestinal Panel by PCR , Stool     Status: None   Collection Time: 01/22/17  6:00 PM  Result Value Ref Range Status   Campylobacter species NOT DETECTED NOT DETECTED Final   Plesimonas shigelloides NOT DETECTED NOT DETECTED Final   Salmonella species NOT DETECTED NOT DETECTED Final   Yersinia enterocolitica NOT DETECTED NOT DETECTED Final   Vibrio species NOT DETECTED NOT DETECTED Final   Vibrio cholerae NOT DETECTED NOT DETECTED Final   Enteroaggregative E coli (EAEC) NOT DETECTED NOT DETECTED Final   Enteropathogenic E coli (EPEC) NOT DETECTED NOT DETECTED Final  Enterotoxigenic E coli (ETEC) NOT DETECTED NOT DETECTED Final   Shiga like toxin producing E coli (STEC) NOT DETECTED NOT DETECTED Final   Shigella/Enteroinvasive E coli (EIEC) NOT DETECTED NOT DETECTED Final   Cryptosporidium NOT DETECTED NOT DETECTED Final   Cyclospora cayetanensis NOT DETECTED NOT DETECTED Final   Entamoeba histolytica NOT DETECTED NOT DETECTED Final   Giardia lamblia NOT DETECTED NOT DETECTED Final   Adenovirus F40/41 NOT DETECTED NOT DETECTED Final   Astrovirus NOT DETECTED NOT DETECTED Final   Norovirus GI/GII NOT DETECTED NOT DETECTED Final   Rotavirus A NOT DETECTED NOT DETECTED Final   Sapovirus (I, II, IV, and V) NOT DETECTED NOT DETECTED Final      Labs: Basic Metabolic Panel:  Recent Labs Lab 01/22/17 1049 01/23/17 0633 01/24/17 0610  NA 137 139 139  K 3.2* 3.9 3.4*  CL 101 105 102  CO2 25 30 28   GLUCOSE 137* 113* 118*  BUN 11 <5* <5*  CREATININE 0.56 0.61 0.55  CALCIUM 9.1 8.5* 9.2  MG  --  1.8  --    Liver Function Tests:  Recent Labs Lab 01/22/17 1049 01/23/17 0633  AST 16 15  ALT 14 12*  ALKPHOS 88 68  BILITOT 0.7 0.5  PROT 6.7 5.6*  ALBUMIN 3.8 3.1*    Recent Labs Lab 01/22/17 1049  LIPASE 15   No results for input(s): AMMONIA in the last 168 hours. CBC:  Recent Labs Lab 01/22/17 1049 01/23/17 0633 01/24/17 0610  WBC 8.7 4.8 6.1  NEUTROABS 5.9  --   --   HGB 14.9 12.8 14.0  HCT 44.7 38.9 41.8  MCV 91.2 92.8 90.5  PLT 203 171 218   Cardiac Enzymes: No results for input(s): CKTOTAL, CKMB, CKMBINDEX, TROPONINI in the last 168 hours. BNP: Invalid input(s): POCBNP CBG: No results for input(s): GLUCAP in the last 168 hours.  Time coordinating discharge:  Greater than 30 minutes  Signed:  Shefali Ng, DO [facility] Pager: [phone] 01/24/2017, 11:30 AM

## 2017-01-26 ENCOUNTER — Encounter: Payer: Self-pay | Admitting: Gastroenterology

## 2017-01-26 NOTE — Telephone Encounter (Signed)
APPT MADE AND LETTER SENT  °

## 2017-01-30 DIAGNOSIS — K922 Gastrointestinal hemorrhage, unspecified: Secondary | ICD-10-CM | POA: Diagnosis not present

## 2017-01-30 DIAGNOSIS — G894 Chronic pain syndrome: Secondary | ICD-10-CM | POA: Diagnosis not present

## 2017-01-30 DIAGNOSIS — J449 Chronic obstructive pulmonary disease, unspecified: Secondary | ICD-10-CM | POA: Diagnosis not present

## 2017-01-30 DIAGNOSIS — Z6821 Body mass index (BMI) 21.0-21.9, adult: Secondary | ICD-10-CM | POA: Diagnosis not present

## 2017-01-30 DIAGNOSIS — F419 Anxiety disorder, unspecified: Secondary | ICD-10-CM | POA: Diagnosis not present

## 2017-01-30 DIAGNOSIS — K55039 Acute (reversible) ischemia of large intestine, extent unspecified: Secondary | ICD-10-CM | POA: Diagnosis not present

## 2017-01-30 DIAGNOSIS — Z23 Encounter for immunization: Secondary | ICD-10-CM | POA: Diagnosis not present

## 2017-01-30 DIAGNOSIS — K219 Gastro-esophageal reflux disease without esophagitis: Secondary | ICD-10-CM | POA: Diagnosis not present

## 2017-01-30 DIAGNOSIS — R109 Unspecified abdominal pain: Secondary | ICD-10-CM | POA: Diagnosis not present

## 2017-03-19 ENCOUNTER — Telehealth: Payer: Self-pay | Admitting: Gastroenterology

## 2017-03-19 ENCOUNTER — Encounter: Payer: Self-pay | Admitting: Gastroenterology

## 2017-03-19 ENCOUNTER — Other Ambulatory Visit: Payer: Self-pay

## 2017-03-19 ENCOUNTER — Ambulatory Visit (INDEPENDENT_AMBULATORY_CARE_PROVIDER_SITE_OTHER): Payer: PPO | Admitting: Gastroenterology

## 2017-03-19 VITALS — BP 125/65 | HR 74 | Temp 97.5°F | Ht 61.0 in | Wt 117.4 lb

## 2017-03-19 DIAGNOSIS — K529 Noninfective gastroenteritis and colitis, unspecified: Secondary | ICD-10-CM | POA: Diagnosis not present

## 2017-03-19 MED ORDER — PEG 3350-KCL-NA BICARB-NACL 420 G PO SOLR: 4000.0000 mL | ORAL | 0 refills | Status: DC

## 2017-03-19 NOTE — Patient Instructions (Signed)
We have scheduled you for a colonoscopy with Dr. Jena Gaussourk in the near future.  We will see you in follow-up after this to discuss an MRI!

## 2017-03-19 NOTE — Progress Notes (Signed)
  Referring Provider: [facility] Primary Care Physician:  [facility] Primary GI: Dr. Rourk   Chief Complaint  Patient presents with  . Abdominal Pain    HPI:   Elizabeth Fowler is a 65 y.o. female presenting today in follow-up after hospitalization for colitis in July 2018. She was seen while inpatient and improved with supportive measures. She is here to arrange colonoscopy, as last was 2010 with anal canal hemorrhoids, hyperplastic rectal polyp, diverticulosis, hepatic flexure polyp lost.   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 LFTs and lipase are normal. Related to chronic narcotic use. MRCP to be considered. She desires to wait until after the colonoscopy to pursue this.   She states she had another flare of diarrhea, abdominal pain, and rectal bleeding on Aug 20th and was given Cipro empirically by PCP. No imaging done at that time. She feels much improved now.   Chronic back pain, on narcotics. Tends to stay more constipated. 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 She takes stool softeners with good results per her report.   Past Medical History:  Diagnosis Date  . Acid reflux    occ  . Anxiety   . Arthritis    "right hip" (08/31/2013)  . Chronic bronchitis (HCC)    "got it q yr til pneumonia shot given" (08/31/2013)  . Chronic lower back pain   . Complication of anesthesia    pt. states a difficult time waking up-last 2 surgeries  . COPD (chronic obstructive pulmonary disease) (HCC)   . Depression   . Heart murmur    10 or more yrs ago echo neg unable to remember where  . High cholesterol   . Migraines    "goes to bed dark room; last migraine yrs ago hx  . MVP (mitral valve prolapse)    mild anterior leafleat MVP with mild MR 02/26/04 echo  . OCD (obsessive compulsive disorder)   . Pneumonia    "I've had pneumonia probably 30 times" (08/31/2013)  . Shingles 2016  . UTI  (lower urinary tract infection) 03/14/2016   starting rx today    Past Surgical History:  Procedure Laterality Date  . ABDOMINAL EXPOSURE N/A 08/31/2013   Procedure: ABDOMINAL EXPOSURE;  Surgeon: Todd F Early, MD;  Location: [facility] OR;  Service: Vascular;  Laterality: N/A;  . ABDOMINAL HYSTERECTOMY  05/1976  . ANTERIOR CERVICAL DECOMP/DISCECTOMY FUSION N/A 07/06/2014   Procedure: ANTERIOR CERVICAL DECOMPRESSION/DISCECTOMY FUSION 1 LEVEL;  Surgeon: Dahari [name], MD;  Location: [facility] OR;  Service: Orthopedics;  Laterality: N/A;  . ANTERIOR LUMBAR FUSION N/A 08/31/2013   Procedure: ALIF L5 - S1 1 LEVEL;  Surgeon: Dahari [name], MD;  Location: [facility] OR;  Service: Orthopedics;  Laterality: N/A;  . APPENDECTOMY  1970's  . CARPAL TUNNEL RELEASE Left   . CHOLECYSTECTOMY  1970's  . COLONOSCOPY WITH ESOPHAGOGASTRODUODENOSCOPY (EGD)  04/23/2009   Dr. Rourk: Distal erosive reflux esophagitis with noncritical Schatzki ring status post dilation. Small hiatal hernia. Mottling submucosal petechiae of the gastric mucosa, benign-appearing stellate shaped prepyloric antral ulcer both with benign biopsies and no H. pylori. On colonoscopy she had anal canal hemorrhoids, diminutive hyperplastic rectal polyp. Diverticulosis. Hepatic flexure polyp lost  . DILATION AND CURETTAGE OF UTERUS  12/1969  . erethra      ureter not urethra  . KIDNEY SURGERY  05/1976   S/P hysterectomy; cut ureter; had to repair kidney/bladder; thought they removed my   kidney for years; records were destroyed; developed problems; told insides looked like mush; cleaned out the infection and repaired leaking bladder I think" (08/31/2013)  . MULTIPLE EXTRACTIONS WITH ALVEOLOPLASTY Bilateral 04/27/2015   Procedure: EXTRACTIONS WITH BILATERAL TORI, MAX (R) TORI;  Surgeon: Ocie DoyneScott Jensen, DDS;  Location: [facility] OR;  Service: Oral Surgery;  Laterality: Bilateral;  . SACROILIAC JOINT FUSION Right 03/19/2016   Procedure: SACROILIAC JOINT FUSION;  Surgeon: Venita Lickahari [name],  MD;  [facility];  Service: Orthopedics;  Laterality: Right;  . WRIST SURGERY Right 1995    Current Outpatient Prescriptions  Medication Sig Dispense Refill  . albuterol (PROVENTIL HFA;VENTOLIN HFA) 108 (90 BASE) MCG/ACT inhaler Inhale 2 puffs into the lungs every 6 (six) hours as needed for wheezing or shortness of breath.    . ALPRAZolam (XANAX) 1 MG tablet Take 1 mg by mouth 3 (three) times daily.    Marland Kitchen. atorvastatin (LIPITOR) 10 MG tablet Take 10 mg by mouth daily.    Marland Kitchen. EPINEPHrine (EPIPEN 2-PAK) 0.3 mg/0.3 mL IJ SOAJ injection Inject 0.3 mg into the muscle once.    . gabapentin (NEURONTIN) 300 MG capsule Take 300 mg by mouth 3 (three) times daily.     Marland Kitchen. HYDROcodone-acetaminophen (NORCO) 10-325 MG tablet Take 1 tablet by mouth every 6 (six) hours as needed. (Patient taking differently: Take 1 tablet by mouth every 6 (six) hours as needed for moderate pain or severe pain. Up to 5 tablets daily) 60 tablet 0  . Menthol, Topical Analgesic, (ASPERCREME MAX ROLL-ON) 16 % LIQD Apply 1 application topically as needed (for pain).    Marland Kitchen. omeprazole (PRILOSEC) 40 MG capsule Take 40 mg by mouth daily.    Marland Kitchen. tiZANidine (ZANAFLEX) 4 MG tablet Take 4 mg by mouth 3 (three) times daily.     No current facility-administered medications for this visit.     Allergies as of 03/19/2017 - Review Complete 03/19/2017  Allergen Reaction Noted  . Bee venom Anaphylaxis 04/19/2015  . Codeine Anaphylaxis and Swelling 08/26/2014  . Penicillins Anaphylaxis, Swelling, and Other (See Comments) 01/22/2017    Family History  Problem Relation Age of Onset  . Heart disease Mother   . Hypertension Mother   . Colon cancer Neg Hx   . Inflammatory bowel disease Neg Hx     Social History   Social History  . Marital status: Married    Spouse name: N/A  . Number of children: N/A  . Years of education: N/A   Social History Main Topics  . Smoking status: Current Every Day Smoker    Packs/day: 0.25    Years: 50.00      Types: Cigarettes  . Smokeless tobacco: Never Used  . Alcohol use No     Comment: 08/31/2013 "drank a little years ago; last drink was probably in the 1990's"  . Drug use: Yes    Types: Marijuana     Comment: last used 01/21/17  . Sexual activity: No   Other Topics Concern  . None   Social History Narrative  . None    Review of Systems: Gen: Denies fever, chills, anorexia. Denies fatigue, weakness, weight loss.  CV: Denies chest pain, palpitations, syncope, peripheral edema, and claudication. Resp: Denies dyspnea at rest, cough, wheezing, coughing up blood, and pleurisy. GI: see HPI  Derm: Denies rash, itching, dry skin Psych: Denies depression, anxiety, memory loss, confusion. No homicidal or suicidal ideation.  Heme: Denies bruising, bleeding, and enlarged lymph nodes.  Physical Exam: BP 125/65  Pulse 74   Temp (!) 97.5 F (36.4 C) (Oral)   Ht  (1.626 m)   Wt 117 lb 6.4 oz (53.3 kg)   BMI 20.15 kg/m  General:   Alert and oriented. No distress noted. Pleasant and cooperative.  Head:  Normocephalic and atraumatic. Eyes:  Conjuctiva clear without scleral icterus. Mouth:  Oral mucosa pink and moist. Good dentition. No lesions. Abdomen:  +BS, soft, non-tender and non-distended. No rebound or guarding. No HSM or masses noted. Msk:  Symmetrical without gross deformities. Normal posture. Extremities:  Without edema. Neurologic:  Alert and  oriented x4 Psych:  Alert and cooperative. Normal mood and affect.  Lab Results  Component Value Date   WBC 6.1 01/24/2017   HGB 14.0 01/24/2017   HCT 41.8 01/24/2017   MCV 90.5 01/24/2017   PLT 218 01/24/2017

## 2017-03-19 NOTE — Progress Notes (Signed)
CC'ED TO PCP 

## 2017-03-19 NOTE — Assessment & Plan Note (Signed)
66 year old female clinically improved after bout of abdominal pain, diarrhea, rectal bleeding and imaging findings of colitis on CT in July 2018. Last colonoscopy in 2010. Clinically improved today. One episode recently treated by PCP but without further imaging. Needs diagnostic colonoscopy in near future.  Proceed with TCS with Dr. Jena Gaussourk in near future: the risks, benefits, and alternatives have been discussed with the patient in detail. The patient states understanding and desires to proceed. 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 LFTs and lipase are normal. She desires to wait until after colonoscopy for this. Return in follow-up after procedure.

## 2017-03-19 NOTE — Telephone Encounter (Signed)
Please have patient return in follow-up in October.

## 2017-03-20 ENCOUNTER — Encounter: Payer: Self-pay | Admitting: Internal Medicine

## 2017-03-20 NOTE — Telephone Encounter (Signed)
PT SCHEDULED

## 2017-03-31 NOTE — Patient Instructions (Signed)
Pilar JarvisWanda H Lea  03/31/2017     @PREFPERIOPPHARMACY @   Your procedure is scheduled on  04/09/2017   [facility] at  1130  A.M.  Call this number if you have problems the morning of surgery:  [phone]   Remember:  Do not eat food or drink liquids after midnight.  Take these medicines the morning of surgery with A SIP OF WATER  Xanax, neurontin, hydrocodone, prilosec, zanaflex.   Do not wear jewelry, make-up or nail polish.  Do not wear lotions, powders, or perfumes, or deoderant.  Do not shave 48 hours prior to surgery.  Men may shave face and neck.  Do not bring valuables to the hospital.  [facility] belongings or valuables.  Contacts, dentures or bridgework may not be worn into surgery.  Leave your suitcase in the car.  After surgery it may be brought to your room.  For patients admitted to the hospital, discharge time will be determined by your treatment team.  Patients discharged the day of surgery will not be allowed to drive home.   Name and phone number of your driver:   family Special instructions:  Follow the diet and prep instructions given to you by Dr Luvenia Starchourk's office.  Please read over the following fact sheets that you were given. Anesthesia Post-op Instructions and Care and Recovery After Surgery       Colonoscopy, Adult A colonoscopy is an exam to look at the entire large intestine. During the exam, a lubricated, bendable tube is inserted into the anus and then passed into the rectum, colon, and other parts of the large intestine. A colonoscopy is often done as a part of normal colorectal screening or in response to certain symptoms, such as anemia, persistent diarrhea, abdominal pain, and blood in the stool. The exam can help screen for and diagnose medical problems, including:  Tumors.  Polyps.  Inflammation.  Areas of bleeding.  Tell a health care provider about:  Any allergies you have.  All  medicines you are taking, including vitamins, herbs, eye drops, creams, and over-the-counter medicines.  Any problems you or family members have had with anesthetic medicines.  Any blood disorders you have.  Any surgeries you have had.  Any medical conditions you have.  Any problems you have had passing stool. What are the risks? Generally, this is a safe procedure. However, problems may occur, including:  Bleeding.  A tear in the intestine.  A reaction to medicines given during the exam.  Infection (rare).  What happens before the procedure? Eating and drinking restrictions Follow instructions from your health care provider about eating and drinking, which may include:  A few days before the procedure - follow a low-fiber diet. Avoid nuts, seeds, dried fruit, raw fruits, and vegetables.  1-3 days before the procedure - follow a clear liquid diet. Drink only clear liquids, such as clear broth or bouillon, black coffee or tea, clear juice, clear soft drinks or sports drinks, gelatin dessert, and popsicles. Avoid any liquids that contain red or purple dye.  On the day of the procedure - do not eat or drink anything during the 2 hours before the procedure, or within the time period that your health care provider recommends.  Bowel prep If you were prescribed an oral bowel prep to clean out your colon:  Take it as told by your health care provider. Starting the day before  your procedure, you will need to drink a large amount of medicated liquid. The liquid will cause you to have multiple loose stools until your stool is almost clear or light green.  If your skin or anus gets irritated from diarrhea, you may use these to relieve the irritation: ? Medicated wipes, such as adult wet wipes with aloe and vitamin E. ? A skin soothing-product like petroleum jelly.  If you vomit while drinking the bowel prep, take a break for up to 60 minutes and then begin the bowel prep again. If  vomiting continues and you cannot take the bowel prep without vomiting, call your health care provider.  General instructions  Ask your health care provider about changing or stopping your regular medicines. This is especially important if you are taking diabetes medicines or blood thinners.  Plan to have someone take you home from the hospital or clinic. What happens during the procedure?  An IV tube may be inserted into one of your veins.  You will be given medicine to help you relax (sedative).  To reduce your risk of infection: ? Your health care team will wash or sanitize their hands. ? Your anal area will be washed with soap.  You will be asked to lie on your side with your knees bent.  Your health care provider will lubricate a long, thin, flexible tube. The tube will have a camera and a light on the end.  The tube will be inserted into your anus.  The tube will be gently eased through your rectum and colon.  Air will be delivered into your colon to keep it open. You may feel some pressure or cramping.  The camera will be used to take images during the procedure.  A small tissue sample may be removed from your body to be examined under a microscope (biopsy). If any potential problems are found, the tissue will be sent to a lab for testing.  If small polyps are found, your health care provider may remove them and have them checked for cancer cells.  The tube that was inserted into your anus will be slowly removed. The procedure may vary among health care providers and hospitals. What happens after the procedure?  Your blood pressure, heart rate, breathing rate, and blood oxygen level will be monitored until the medicines you were given have worn off.  Do not drive for 24 hours after the exam.  You may have a small amount of blood in your stool.  You may pass gas and have mild abdominal cramping or bloating due to the air that was used to inflate your colon during the  exam.  It is up to you to get the results of your procedure. Ask your health care provider, or the department performing the procedure, when your results will be ready. This information is not intended to replace advice given to you by your health care provider. Make sure you discuss any questions you have with your health care provider. Document Released: 07/04/2000 Document Revised: 05/07/2016 Document Reviewed: 09/18/2015 Elsevier Interactive Patient Education  2018 [employer].  Colonoscopy, Adult, Care After This sheet gives you information about how to care for yourself after your procedure. Your health care provider may also give you more specific instructions. If you have problems or questions, contact your health care provider. What can I expect after the procedure? After the procedure, it is common to have:  A small amount of blood in your stool for 24 hours after the  procedure.  Some gas.  Mild abdominal cramping or bloating.  Follow these instructions at home: General instructions   For the first 24 hours after the procedure: ? Do not drive or use machinery. ? Do not sign important documents. ? Do not drink alcohol. ? Do your regular daily activities at a slower pace than normal. ? Eat soft, easy-to-digest foods. ? Rest often.  Take over-the-counter or prescription medicines only as told by your health care provider.  It is up to you to get the results of your procedure. Ask your health care provider, or the department performing the procedure, when your results will be ready. Relieving cramping and bloating  Try walking around when you have cramps or feel bloated.  Apply heat to your abdomen as told by your health care provider. Use a heat source that your health care provider recommends, such as a moist heat pack or a heating pad. ? Place a towel between your skin and the heat source. ? Leave the heat on for 20-30 minutes. ? Remove the heat if your skin turns  bright red. This is especially important if you are unable to feel pain, heat, or cold. You may have a greater risk of getting burned. Eating and drinking  Drink enough fluid to keep your urine clear or pale yellow.  Resume your normal diet as instructed by your health care provider. Avoid heavy or fried foods that are hard to digest.  Avoid drinking alcohol for as long as instructed by your health care provider. Contact a health care provider if:  You have blood in your stool 2-3 days after the procedure. Get help right away if:  You have more than a small spotting of blood in your stool.  You pass large blood clots in your stool.  Your abdomen is swollen.  You have nausea or vomiting.  You have a fever.  You have increasing abdominal pain that is not relieved with medicine. This information is not intended to replace advice given to you by your health care provider. Make sure you discuss any questions you have with your health care provider. Document Released: 02/19/2004 Document Revised: 03/31/2016 Document Reviewed: 09/18/2015 Elsevier Interactive Patient Education  2018 [employer] Anesthesia is a term that refers to techniques, procedures, and medicines that help a person stay safe and comfortable during a medical procedure. Monitored anesthesia care, or sedation, is one type of anesthesia. Your anesthesia specialist may recommend sedation if you will be having a procedure that does not require you to be unconscious, such as:  Cataract surgery.  A dental procedure.  A biopsy.  A colonoscopy.  During the procedure, you may receive a medicine to help you relax (sedative). There are three levels of sedation:  Mild sedation. At this level, you may feel awake and relaxed. You will be able to follow directions.  Moderate sedation. At this level, you will be sleepy. You may not remember the procedure.  Deep sedation. At this level, you will be  asleep. You will not remember the procedure.  The more medicine you are given, the deeper your level of sedation will be. Depending on how you respond to the procedure, the anesthesia specialist may change your level of sedation or the type of anesthesia to fit your needs. An anesthesia specialist will monitor you closely during the procedure. Let your health care provider know about:  Any allergies you have.  All medicines you are taking, including vitamins, herbs, eye drops,  creams, and over-the-counter medicines.  Any use of steroids (by mouth or as a cream).  Any problems you or family members have had with sedatives and anesthetic medicines.  Any blood disorders you have.  Any surgeries you have had.  Any medical conditions you have, such as sleep apnea.  Whether you are pregnant or may be pregnant.  Any use of cigarettes, alcohol, or street drugs. What are the risks? Generally, this is a safe procedure. However, problems may occur, including:  Getting too much medicine (oversedation).  Nausea.  Allergic reaction to medicines.  Trouble breathing. If this happens, a breathing tube may be used to help with breathing. It will be removed when you are awake and breathing on your own.  Heart trouble.  Lung trouble.  Before the procedure Staying hydrated Follow instructions from your health care provider about hydration, which may include:  Up to 2 hours before the procedure - you may continue to drink clear liquids, such as water, clear fruit juice, black coffee, and plain tea.  Eating and drinking restrictions Follow instructions from your health care provider about eating and drinking, which may include:  8 hours before the procedure - stop eating heavy meals or foods such as meat, fried foods, or fatty foods.  6 hours before the procedure - stop eating light meals or foods, such as toast or cereal.  6 hours before the procedure - stop drinking milk or drinks that  contain milk.  2 hours before the procedure - stop drinking clear liquids.  Medicines Ask your health care provider about:  Changing or stopping your regular medicines. This is especially important if you are taking diabetes medicines or blood thinners.  Taking medicines such as aspirin and ibuprofen. These medicines can thin your blood. Do not take these medicines before your procedure if your health care provider instructs you not to.  Tests and exams  You will have a physical exam.  You may have blood tests done to show: ? How well your kidneys and liver are working. ? How well your blood can clot.  General instructions  Plan to have someone take you home from the hospital or clinic.  If you will be going home right after the procedure, plan to have someone with you for 24 hours.  What happens during the procedure?  Your blood pressure, heart rate, breathing, level of pain and overall condition will be monitored.  An IV tube will be inserted into one of your veins.  Your anesthesia specialist will give you medicines as needed to keep you comfortable during the procedure. This may mean changing the level of sedation.  The procedure will be performed. After the procedure  Your blood pressure, heart rate, breathing rate, and blood oxygen level will be monitored until the medicines you were given have worn off.  Do not drive for 24 hours if you received a sedative.  You may: ? Feel sleepy, clumsy, or nauseous. ? Feel forgetful about what happened after the procedure. ? Have a sore throat if you had a breathing tube during the procedure. ? Vomit. This information is not intended to replace advice given to you by your health care provider. Make sure you discuss any questions you have with your health care provider. Document Released: 04/02/2005 Document Revised: 12/14/2015 Document Reviewed: 10/28/2015 Elsevier Interactive Patient Education  2018 [place], Care After These instructions provide you with information about caring for yourself after your procedure. Your health care provider  may also give you more specific instructions. Your treatment has been planned according to current medical practices, but problems sometimes occur. Call your health care provider if you have any problems or questions after your procedure. What can I expect after the procedure? After your procedure, it is common to:  Feel sleepy for several hours.  Feel clumsy and have poor balance for several hours.  Feel forgetful about what happened after the procedure.  Have poor judgment for several hours.  Feel nauseous or vomit.  Have a sore throat if you had a breathing tube during the procedure.  Follow these instructions at home: For at least 24 hours after the procedure:   Do not: ? Participate in activities in which you could fall or become injured. ? Drive. ? Use heavy machinery. ? Drink alcohol. ? Take sleeping pills or medicines that cause drowsiness. ? Make important decisions or sign legal documents. ? Take care of children on your own.  Rest. Eating and drinking  Follow the diet that is recommended by your health care provider.  If you vomit, drink water, juice, or soup when you can drink without vomiting.  Make sure you have little or no nausea before eating solid foods. General instructions  Have a responsible adult stay with you until you are awake and alert.  Take over-the-counter and prescription medicines only as told by your health care provider.  If you smoke, do not smoke without supervision.  Keep all follow-up visits as told by your health care provider. This is important. Contact a health care provider if:  You keep feeling nauseous or you keep vomiting.  You feel light-headed.  You develop a rash.  You have a fever. Get help right away if:  You have trouble breathing. This information is not  intended to replace advice given to you by your health care provider. Make sure you discuss any questions you have with your health care provider. Document Released: 10/28/2015 Document Revised: 02/27/2016 Document Reviewed: 10/28/2015 Elsevier Interactive Patient Education  [employer].

## 2017-04-01 ENCOUNTER — Encounter (HOSPITAL_COMMUNITY)
Admission: RE | Admit: 2017-04-01 | Discharge: 2017-04-01 | Disposition: A | Discharge status: Home / Self Care | Payer: PPO | Source: Ambulatory Visit | Attending: Internal Medicine | Admitting: Internal Medicine

## 2017-04-01 ENCOUNTER — Encounter (HOSPITAL_COMMUNITY): Payer: Self-pay

## 2017-04-01 DIAGNOSIS — Z01812 Encounter for preprocedural laboratory examination: Secondary | ICD-10-CM | POA: Insufficient documentation

## 2017-04-01 DIAGNOSIS — R1319 Other dysphagia: Secondary | ICD-10-CM | POA: Insufficient documentation

## 2017-04-01 DIAGNOSIS — G8929 Other chronic pain: Secondary | ICD-10-CM | POA: Insufficient documentation

## 2017-04-01 DIAGNOSIS — M129 Arthropathy, unspecified: Secondary | ICD-10-CM | POA: Insufficient documentation

## 2017-04-01 DIAGNOSIS — M549 Dorsalgia, unspecified: Secondary | ICD-10-CM | POA: Insufficient documentation

## 2017-04-01 DIAGNOSIS — K5909 Other constipation: Secondary | ICD-10-CM | POA: Insufficient documentation

## 2017-04-01 DIAGNOSIS — R634 Abnormal weight loss: Secondary | ICD-10-CM | POA: Insufficient documentation

## 2017-04-01 DIAGNOSIS — J449 Chronic obstructive pulmonary disease, unspecified: Secondary | ICD-10-CM | POA: Insufficient documentation

## 2017-04-01 LAB — BASIC METABOLIC PANEL
Anion gap: 8 (ref 5–15)
BUN: 10 mg/dL (ref 6–20)
CO2: 29 mmol/L (ref 22–32)
Calcium: 9.4 mg/dL (ref 8.9–10.3)
Chloride: 101 mmol/L (ref 101–111)
Creatinine, Ser: 0.66 mg/dL (ref 0.44–1.00)
GFR calc Af Amer: >60 mL/min (ref >60)
GFR calc non Af Amer: >60 mL/min (ref >60)
Glucose, Bld: 88 mg/dL (ref 65–99)
Potassium: 4 mmol/L (ref 3.5–5.1)
Sodium: 138 mmol/L (ref 135–145)

## 2017-04-01 LAB — CBC WITH DIFFERENTIAL/PLATELET
Basophils Absolute: 0 10*3/uL (ref 0.0–0.1)
Basophils Relative: 0 %
Eosinophils Absolute: 0.1 10*3/uL (ref 0.0–0.7)
Eosinophils Relative: 1 %
HCT: 43.8 % (ref 36.0–46.0)
Hemoglobin: 14.7 g/dL (ref 12.0–15.0)
Lymphocytes Relative: 31 %
Lymphs Abs: 2.7 10*3/uL (ref 0.7–4.0)
MCH: 30.6 pg (ref 26.0–34.0)
MCHC: 33.6 g/dL (ref 30.0–36.0)
MCV: 91.3 fL (ref 78.0–100.0)
Monocytes Absolute: 0.6 10*3/uL (ref 0.1–1.0)
Monocytes Relative: 7 %
Neutro Abs: 5.2 10*3/uL (ref 1.7–7.7)
Neutrophils Relative %: 61 %
Platelets: 198 10*3/uL (ref 150–400)
RBC: 4.8 MIL/uL (ref 3.87–5.11)
RDW: 13.7 % (ref 11.5–15.5)
WBC: 8.6 10*3/uL (ref 4.0–10.5)

## 2017-04-03 ENCOUNTER — Inpatient Hospital Stay (HOSPITAL_COMMUNITY): Admission: RE | Admit: 2017-04-03 | Payer: PPO | Source: Ambulatory Visit

## 2017-04-09 ENCOUNTER — Ambulatory Visit (HOSPITAL_COMMUNITY): Payer: PPO | Admitting: Anesthesiology

## 2017-04-09 ENCOUNTER — Encounter (HOSPITAL_COMMUNITY): Payer: Self-pay | Admitting: *Deleted

## 2017-04-09 ENCOUNTER — Encounter (HOSPITAL_COMMUNITY)
Admission: RE | Disposition: A | Discharge status: Home / Self Care | Payer: Self-pay | Source: Ambulatory Visit | Attending: Internal Medicine

## 2017-04-09 ENCOUNTER — Ambulatory Visit (HOSPITAL_COMMUNITY)
Admission: RE | Admit: 2017-04-09 | Discharge: 2017-04-09 | Disposition: A | Discharge status: Home / Self Care | Payer: PPO | Source: Ambulatory Visit | Attending: Internal Medicine | Admitting: Internal Medicine

## 2017-04-09 DIAGNOSIS — K529 Noninfective gastroenteritis and colitis, unspecified: Secondary | ICD-10-CM

## 2017-04-09 DIAGNOSIS — K581 Irritable bowel syndrome with constipation: Secondary | ICD-10-CM | POA: Insufficient documentation

## 2017-04-09 DIAGNOSIS — E7800 Pure hypercholesterolemia, unspecified: Secondary | ICD-10-CM | POA: Diagnosis not present

## 2017-04-09 DIAGNOSIS — K6389 Other specified diseases of intestine: Secondary | ICD-10-CM | POA: Diagnosis not present

## 2017-04-09 DIAGNOSIS — K219 Gastro-esophageal reflux disease without esophagitis: Secondary | ICD-10-CM | POA: Diagnosis not present

## 2017-04-09 DIAGNOSIS — M1611 Unilateral primary osteoarthritis, right hip: Secondary | ICD-10-CM | POA: Insufficient documentation

## 2017-04-09 DIAGNOSIS — I341 Nonrheumatic mitral (valve) prolapse: Secondary | ICD-10-CM | POA: Diagnosis not present

## 2017-04-09 DIAGNOSIS — R933 Abnormal findings on diagnostic imaging of other parts of digestive tract: Secondary | ICD-10-CM | POA: Diagnosis not present

## 2017-04-09 DIAGNOSIS — K621 Rectal polyp: Secondary | ICD-10-CM | POA: Diagnosis not present

## 2017-04-09 DIAGNOSIS — Z79899 Other long term (current) drug therapy: Secondary | ICD-10-CM | POA: Insufficient documentation

## 2017-04-09 DIAGNOSIS — Z90710 Acquired absence of both cervix and uterus: Secondary | ICD-10-CM | POA: Insufficient documentation

## 2017-04-09 DIAGNOSIS — F329 Major depressive disorder, single episode, unspecified: Secondary | ICD-10-CM | POA: Insufficient documentation

## 2017-04-09 DIAGNOSIS — Z880 Allergy status to penicillin: Secondary | ICD-10-CM | POA: Insufficient documentation

## 2017-04-09 DIAGNOSIS — Z8249 Family history of ischemic heart disease and other diseases of the circulatory system: Secondary | ICD-10-CM | POA: Diagnosis not present

## 2017-04-09 DIAGNOSIS — F429 Obsessive-compulsive disorder, unspecified: Secondary | ICD-10-CM | POA: Diagnosis not present

## 2017-04-09 DIAGNOSIS — K5730 Diverticulosis of large intestine without perforation or abscess without bleeding: Secondary | ICD-10-CM | POA: Insufficient documentation

## 2017-04-09 DIAGNOSIS — F17210 Nicotine dependence, cigarettes, uncomplicated: Secondary | ICD-10-CM | POA: Insufficient documentation

## 2017-04-09 DIAGNOSIS — K635 Polyp of colon: Secondary | ICD-10-CM | POA: Diagnosis not present

## 2017-04-09 DIAGNOSIS — Z981 Arthrodesis status: Secondary | ICD-10-CM | POA: Diagnosis not present

## 2017-04-09 DIAGNOSIS — F419 Anxiety disorder, unspecified: Secondary | ICD-10-CM | POA: Insufficient documentation

## 2017-04-09 DIAGNOSIS — R011 Cardiac murmur, unspecified: Secondary | ICD-10-CM | POA: Insufficient documentation

## 2017-04-09 DIAGNOSIS — Z885 Allergy status to narcotic agent status: Secondary | ICD-10-CM | POA: Insufficient documentation

## 2017-04-09 DIAGNOSIS — J449 Chronic obstructive pulmonary disease, unspecified: Secondary | ICD-10-CM | POA: Diagnosis not present

## 2017-04-09 DIAGNOSIS — Z91030 Bee allergy status: Secondary | ICD-10-CM | POA: Insufficient documentation

## 2017-04-09 HISTORY — PX: COLONOSCOPY WITH PROPOFOL: SHX5780

## 2017-04-09 HISTORY — PX: POLYPECTOMY: SHX5525

## 2017-04-09 SURGERY — COLONOSCOPY WITH PROPOFOL
Anesthesia: Monitor Anesthesia Care

## 2017-04-09 MED ORDER — ONDANSETRON 4 MG PO TBDP
ORAL_TABLET | ORAL | Status: AC
  Filled 2017-04-09: qty 1

## 2017-04-09 MED ORDER — ONDANSETRON 4 MG PO TBDP
4.0000 mg | ORAL_TABLET | Freq: Once | ORAL | Status: AC
  Administered 2017-04-09: 4 mg via ORAL

## 2017-04-09 MED ORDER — MIDAZOLAM HCL 2 MG/2ML IJ SOLN
INTRAMUSCULAR | Status: AC
  Filled 2017-04-09: qty 2

## 2017-04-09 MED ORDER — PROPOFOL 500 MG/50ML IV EMUL
INTRAVENOUS | Status: DC | PRN
  Administered 2017-04-09: 75 ug/kg/min via INTRAVENOUS

## 2017-04-09 MED ORDER — CHLORHEXIDINE GLUCONATE CLOTH 2 % EX PADS: 6.0000 | MEDICATED_PAD | Freq: Once | CUTANEOUS | Status: DC

## 2017-04-09 MED ORDER — MIDAZOLAM HCL 5 MG/5ML IJ SOLN
INTRAMUSCULAR | Status: DC | PRN
  Administered 2017-04-09: 2 mg via INTRAVENOUS

## 2017-04-09 MED ORDER — PROPOFOL 10 MG/ML IV BOLUS
INTRAVENOUS | Status: AC
  Filled 2017-04-09: qty 40

## 2017-04-09 MED ORDER — MIDAZOLAM HCL 2 MG/2ML IJ SOLN
1.0000 mg | Freq: Once | INTRAMUSCULAR | Status: AC | PRN
  Administered 2017-04-09: 2 mg via INTRAVENOUS

## 2017-04-09 MED ORDER — LACTATED RINGERS IV SOLN
INTRAVENOUS | Status: DC
  Administered 2017-04-09: 1000 mL via INTRAVENOUS

## 2017-04-09 NOTE — Op Note (Addendum)
[facility] Patient Name: Elizabeth Fowler Procedure Date: 04/09/2017 11:10 AM MRN: [id number] Date of Birth: 02/23/1951 Attending MD: Gennette Pac , MD CSN: [id number] Age: 66 Admit Type: Outpatient Procedure:                Colonoscopy Indications:              Abnormal CT of the GI tract Providers:                Gennette Pac, MD, Loma Messing B. Patsy Lager, RN,                            Dyann Ruddle Referring MD:              Medicines:                Propofol per Anesthesia Complications:            No immediate complications. Estimated Blood Loss:     Estimated blood loss was minimal. Procedure:                Pre-Anesthesia Assessment:                           - Prior to the procedure, a History and Physical                            was performed, and patient medications and                            allergies were reviewed. The patient's tolerance of                            previous anesthesia was also reviewed. The risks                            and benefits of the procedure and the sedation                            options and risks were discussed with the patient.                            All questions were answered, and informed consent                            was obtained. Prior Anticoagulants: The patient has                            taken no previous anticoagulant or antiplatelet                            agents. ASA Grade Assessment: III - A patient with                            severe systemic disease. After reviewing the risks  and benefits, the patient was deemed in                            satisfactory condition to undergo the procedure.                           After obtaining informed consent, the colonoscope                            was passed under direct vision. Throughout the                            procedure, the patient's blood pressure, pulse, and                            oxygen  saturations were monitored continuously. The                            EC-[id number] ([id number]) scope was introduced through                            the anus and advanced to the the cecum, identified                            by appendiceal orifice and ileocecal valve. The                            ileocecal valve, appendiceal orifice, and rectum                            were photographed. The entire colon was well                            visualized. The colonoscopy was performed without                            difficulty. The patient tolerated the procedure                            well. The quality of the bowel preparation was                            adequate. Scope In: 11:49:05 AM Scope Out: 12:07:04 PM Scope Withdrawal Time: 0 hours 12 minutes 20 seconds  Total Procedure Duration: 0 hours 17 minutes 59 seconds  Findings:      The perianal and digital rectal examinations were normal.      Scattered small and large-mouthed diverticula were found in the entire       colon. Scattered mamalatiions in the rectum and rectosigmoid region. One       was cold snared.      The exam was otherwise without abnormality on direct views. Rectal vault       small. Unable to retroflex. However, rectal mucosa seen well on?face.      A 2 mm mammalation was found in the  recto-sigmoid colon. . It was       removed with a cold snare. Resection and retrieval were complete.       Estimated blood loss was minimal. Impression:               - Diverticulosis in the entire examined colon.                           - The examination was otherwise normal on direct                            and retroflexion views.                           - One 2 mm mamalation at the recto-sigmoid colon,                            removed with a cold snare. Resected and retrieved. Moderate Sedation:      Moderate (conscious) sedation was personally administered by an       anesthesia professional. The following  parameters were monitored: oxygen       saturation, heart rate, blood pressure, respiratory rate, EKG, adequacy       of pulmonary ventilation, and response to care. Total physician       intraservice time was 26 minutes. Recommendation:           - Patient has a contact number available for                            emergencies. The signs and symptoms of potential                            delayed complications were discussed with the                            patient. Return to normal activities tomorrow.                            Written discharge instructions were provided to the                            patient.                           - Resume previous diet.                           - Continue present medications.                           - Await pathology results. Begin Benefiber 1                            tablespoon twice daily                           - Repeat colonoscopy date to be determined after  pending pathology results are reviewed for                            surveillance based on pathology results.                           - Return to GI clinic in 2 months. Further                            evaluation of biliary tree will be pursued at that                            time. Procedure Code(s):        --- Professional ---                           [phone], Colonoscopy, flexible; diagnostic, including                            collection of specimen(s) by brushing or washing,                            when performed (separate procedure) Diagnosis Code(s):        --- Professional ---                           K57.30, Diverticulosis of large intestine without                            perforation or abscess without bleeding                           R93.3, Abnormal findings on diagnostic imaging of                            other parts of digestive tract CPT copyright 2016 American [employer] Association. All rights reserved. The  codes documented in this report are preliminary and upon coder review may  be revised to meet current compliance requirements. Gerrit Friends. Royce Sciara, MD Gennette Pac, MD 04/09/2017 12:16:43 PM This report has been signed electronically. Number of Addenda: 0

## 2017-04-09 NOTE — Anesthesia Postprocedure Evaluation (Signed)
Anesthesia Post Note  Patient: Elizabeth Fowler  Procedure(s) Performed: Procedure(s) (LRB): COLONOSCOPY WITH PROPOFOL (N/A) POLYPECTOMY  Patient location during evaluation: PACU Anesthesia Type: MAC Level of consciousness: awake and alert and patient cooperative Pain management: pain level controlled Vital Signs Assessment: post-procedure vital signs reviewed and stable Respiratory status: spontaneous breathing, nonlabored ventilation and respiratory function stable Cardiovascular status: blood pressure returned to baseline Postop Assessment: no apparent nausea or vomiting Anesthetic complications: no     Last Vitals:  Vitals:   04/09/17 1115 04/09/17 1130  BP: (!) 131/58 133/67  Resp: 16 (!) 35  Temp:    SpO2: 94% 96%    Last Pain:  Vitals:   04/09/17 1005  TempSrc: Oral                 Imre Vecchione J

## 2017-04-09 NOTE — Interval H&P Note (Signed)
History and Physical Interval Note:  04/09/2017 11:29 AM  Elizabeth Fowler  has presented today for surgery, with the diagnosis of COLITIS  The various methods of treatment have been discussed with the patient and family. After consideration of risks, benefits and other options for treatment, the patient has consented to  Procedure(s) with comments: COLONOSCOPY WITH PROPOFOL (N/A) - 115 as a surgical intervention .  The patient's history has been reviewed, patient examined, no change in status, stable for surgery.  I have reviewed the patient's chart and labs.  Questions were answered to the patient's satisfaction.      Patient reports bowel symptoms back to normal. Colonoscopy today per plan.The risks, benefits, limitations, alternatives and imponderables have been reviewed with the patient. Questions have been answered. All parties are agreeable.  Elizabeth Fowler

## 2017-04-09 NOTE — Discharge Instructions (Addendum)
Colonoscopy Discharge Instructions  Read the instructions outlined below and refer to this sheet in the next few weeks. These discharge instructions provide you with general information on caring for yourself after you leave the hospital. Your doctor may also give you specific instructions. While your treatment has been planned according to the most current medical practices available, unavoidable complications occasionally occur. If you have any problems or questions after discharge, call Dr. Jena Gauss at [phone]. ACTIVITY  You may resume your regular activity, but move at a slower pace for the next 24 hours.   Take frequent rest periods for the next 24 hours.   Walking will help get rid of the air and reduce the bloated feeling in your belly (abdomen).   No driving for 24 hours (because of the medicine (anesthesia) used during the test).    Do not sign any important legal documents or operate any machinery for 24 hours (because of the anesthesia used during the test).  NUTRITION  Drink plenty of fluids.   You may resume your normal diet as instructed by your doctor.   Begin with a light meal and progress to your normal diet. Heavy or fried foods are harder to digest and may make you feel sick to your stomach (nauseated).   Avoid alcoholic beverages for 24 hours or as instructed.  MEDICATIONS  You may resume your normal medications unless your doctor tells you otherwise.  WHAT YOU CAN EXPECT TODAY  Some feelings of bloating in the abdomen.   Passage of more gas than usual.   Spotting of blood in your stool or on the toilet paper.  IF YOU HAD POLYPS REMOVED DURING THE COLONOSCOPY:  No aspirin products for 7 days or as instructed.   No alcohol for 7 days or as instructed.   Eat a soft diet for the next 24 hours.  FINDING OUT THE RESULTS OF YOUR TEST Not all test results are available during your visit. If your test results are not back during the visit, make an appointment  with your caregiver to find out the results. Do not assume everything is normal if you have not heard from your caregiver or the medical facility. It is important for you to follow up on all of your test results.  SEEK IMMEDIATE MEDICAL ATTENTION IF:  You have more than a spotting of blood in your stool.   Your belly is swollen (abdominal distention).   You are nauseated or vomiting.   You have a temperature over 101.   You have abdominal pain or discomfort that is severe or gets worse throughout the day.    Colon polyp and diverticulosis information provided  Begin Benefiber 1 tablespoon twice daily  Further recommendations to follow pending review of pathology report  Office visit with [place] in 2 months.     Colon Polyps Polyps are tissue growths inside the body. Polyps can grow in many places, including the large intestine (colon). A polyp may be a round bump or a mushroom-shaped growth. You could have one polyp or several. Most colon polyps are noncancerous (benign). However, some colon polyps can become cancerous over time. What are the causes? The exact cause of colon polyps is not known. What increases the risk? This condition is more likely to develop in people who:  Have a family history of colon cancer or colon polyps.  Are older than 50 or older than 45 if they are African American.  Have inflammatory bowel disease, such as ulcerative colitis or  Crohn disease.  Are overweight.  Smoke cigarettes.  Do not get enough exercise.  Drink too much alcohol.  Eat a diet that is: ? High in fat and red meat. ? Low in fiber.  Had childhood cancer that was treated with abdominal radiation.  What are the signs or symptoms? Most polyps do not cause symptoms. If you have symptoms, they may include:  Blood coming from your rectum when having a bowel movement.  Blood in your stool.The stool may look dark red or black.  A change in bowel habits, such as constipation  or diarrhea.  How is this diagnosed? This condition is diagnosed with a colonoscopy. This is a procedure that uses a lighted, flexible scope to look at the inside of your colon. How is this treated? Treatment for this condition involves removing any polyps that are found. Those polyps will then be tested for cancer. If cancer is found, your health care provider will talk to you about options for colon cancer treatment. Follow these instructions at home: Diet  Eat plenty of fiber, such as fruits, vegetables, and whole grains.  Eat foods that are high in calcium and vitamin D, such as milk, cheese, yogurt, eggs, liver, fish, and broccoli.  Limit foods high in fat, red meats, and processed meats, such as hot dogs, sausage, bacon, and lunch meats.  Maintain a healthy weight, or lose weight if recommended by your health care provider. General instructions  Do not smoke cigarettes.  Do not drink alcohol excessively.  Keep all follow-up visits as told by your health care provider. This is important. This includes keeping regularly scheduled colonoscopies. Talk to your health care provider about when you need a colonoscopy.  Exercise every day or as told by your health care provider. Contact a health care provider if:  You have new or worsening bleeding during a bowel movement.  You have new or increased blood in your stool.  You have a change in bowel habits.  You unexpectedly lose weight. This information is not intended to replace advice given to you by your health care provider. Make sure you discuss any questions you have with your health care provider. Document Released: 04/02/2004 Document Revised: 12/13/2015 Document Reviewed: 05/28/2015 Elsevier Interactive Patient Education  [employer].    Diverticulosis Diverticulosis is a condition that develops when small pouches (diverticula) form in the wall of the large intestine (colon). The colon is where water is absorbed  and stool is formed. The pouches form when the inside layer of the colon pushes through weak spots in the outer layers of the colon. You may have a few pouches or many of them. What are the causes? The cause of this condition is not known. What increases the risk? The following factors may make you more likely to develop this condition:  Being older than age 14. Your risk for this condition increases with age. Diverticulosis is rare among people younger than age 33. By age 108, many people have it.  Eating a low-fiber diet.  Having frequent constipation.  Being overweight.  Not getting enough exercise.  Smoking.  Taking over-the-counter pain medicines, like aspirin and ibuprofen.  Having a family history of diverticulosis.  What are the signs or symptoms? In most people, there are no symptoms of this condition. If you do have symptoms, they may include:  Bloating.  Cramps in the abdomen.  Constipation or diarrhea.  Pain in the lower left side of the abdomen.  How is this diagnosed?  This condition is most often diagnosed during an exam for other colon problems. Because diverticulosis usually has no symptoms, it often cannot be diagnosed independently. This condition may be diagnosed by:  Using a flexible scope to examine the colon (colonoscopy).  Taking an X-ray of the colon after dye has been put into the colon (barium enema).  Doing a CT scan.  How is this treated? You may not need treatment for this condition if you have never developed an infection related to diverticulosis. If you have had an infection before, treatment may include:  Eating a high-fiber diet. This may include eating more fruits, vegetables, and grains.  Taking a fiber supplement.  Taking a live bacteria supplement (probiotic).  Taking medicine to relax your colon.  Taking antibiotic medicines.  Follow these instructions at home:  Drink 6-8 glasses of water or more each day to prevent  constipation.  Try not to strain when you have a bowel movement.  If you have had an infection before: ? Eat more fiber as directed by your health care provider or your diet and nutrition specialist (dietitian). ? Take a fiber supplement or probiotic, if your health care provider approves.  Take over-the-counter and prescription medicines only as told by your health care provider.  If you were prescribed an antibiotic, take it as told by your health care provider. Do not stop taking the antibiotic even if you start to feel better.  Keep all follow-up visits as told by your health care provider. This is important. Contact a health care provider if:  You have pain in your abdomen.  You have bloating.  You have cramps.  You have not had a bowel movement in 3 days. Get help right away if:  Your pain gets worse.  Your bloating becomes very bad.  You have a fever or chills, and your symptoms suddenly get worse.  You vomit.  You have bowel movements that are bloody or black.  You have bleeding from your rectum. Summary  Diverticulosis is a condition that develops when small pouches (diverticula) form in the wall of the large intestine (colon).  You may have a few pouches or many of them.  This condition is most often diagnosed during an exam for other colon problems.  If you have had an infection related to diverticulosis, treatment may include increasing the fiber in your diet, taking supplements, or taking medicines. This information is not intended to replace advice given to you by your health care provider. Make sure you discuss any questions you have with your health care provider. Document Released: 04/03/2004 Document Revised: 05/26/2016 Document Reviewed: 05/26/2016 Elsevier Interactive Patient Education  2017 [employer].
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
## 2017-04-09 NOTE — Transfer of Care (Signed)
Immediate Anesthesia Transfer of Care Note  Patient: Elizabeth Fowler  Procedure(s) Performed: Procedure(s) with comments: COLONOSCOPY WITH PROPOFOL (N/A) - 115 POLYPECTOMY - colon  Patient Location: PACU  Anesthesia Type:MAC  Level of Consciousness: awake, alert  and patient cooperative  Airway & Oxygen Therapy: Patient Spontanous Breathing and Patient connected to face mask oxygen  Post-op Assessment: Report given to RN, Post -op Vital signs reviewed and stable and Patient moving all extremities  Post vital signs: Reviewed and stable  Last Vitals:  Vitals:   04/09/17 1115 04/09/17 1130  BP: (!) 131/58 133/67  Resp: 16 (!) 35  Temp:    SpO2: 94% 96%    Last Pain:  Vitals:   04/09/17 1005  TempSrc: Oral      Patients Stated Pain Goal: 7 (51/70/01 7494)  Complications: No apparent anesthesia complications

## 2017-04-09 NOTE — H&P (View-Only) (Signed)
Referring Provider: [name] Primary Care Physician:  [name] Primary GI: Dr. Jena Gauss   Chief Complaint  Patient presents with  . Abdominal Pain    HPI:   Elizabeth Fowler is a 66 y.o. female presenting today in follow-up after hospitalization for colitis in July 2018. She was seen while inpatient and improved with supportive measures. She is here to arrange colonoscopy, as last was 2010 with anal canal hemorrhoids, hyperplastic rectal polyp, diverticulosis, hepatic flexure polyp lost.   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 LFTs and lipase are normal. Related to chronic narcotic use. MRCP to be considered. She desires to wait until after the colonoscopy to pursue this.   She states she had another flare of diarrhea, abdominal pain, and rectal bleeding on Aug 20th and was given Cipro empirically by PCP. No imaging done at that time. She feels much improved now.   Chronic back pain, on narcotics. Tends to stay more constipated. 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 She takes stool softeners with good results per her report.   Past Medical History:  Diagnosis Date  . Acid reflux    occ  . Anxiety   . Arthritis    "right hip" (08/31/2013)  . Chronic bronchitis (HCC)    "got it q yr til pneumonia shot given" (08/31/2013)  . Chronic lower back pain   . Complication of anesthesia    pt. states a difficult time waking up-last 2 surgeries  . COPD (chronic obstructive pulmonary disease) (HCC)   . Depression   . Heart murmur    10 or more yrs ago echo neg unable to remember where  . High cholesterol   . Migraines    "goes to bed dark room; last migraine yrs ago hx  . MVP (mitral valve prolapse)    mild anterior leafleat MVP with mild MR 02/26/04 echo  . OCD (obsessive compulsive disorder)   . Pneumonia    "I've had pneumonia probably 30 times" (08/31/2013)  . Shingles 2016  . UTI  (lower urinary tract infection) 03/14/2016   starting rx today    Past Surgical History:  Procedure Laterality Date  . ABDOMINAL EXPOSURE N/A 08/31/2013   Procedure: ABDOMINAL EXPOSURE;  Surgeon: Larina Earthly, MD;  [facility];  Service: Vascular;  Laterality: N/A;  . ABDOMINAL HYSTERECTOMY  05/1976  . ANTERIOR CERVICAL DECOMP/DISCECTOMY FUSION N/A 07/06/2014   Procedure: ANTERIOR CERVICAL DECOMPRESSION/DISCECTOMY FUSION 1 LEVEL;  Surgeon: Venita Lick, MD;  Location: [facility] OR;  Service: Orthopedics;  Laterality: N/A;  . ANTERIOR LUMBAR FUSION N/A 08/31/2013   Procedure: ALIF L5 - S1 1 LEVEL;  Surgeon: Venita Lick, MD;  Location: [facility] OR;  Service: Orthopedics;  Laterality: N/A;  . APPENDECTOMY  1970's  . CARPAL TUNNEL RELEASE Left   . CHOLECYSTECTOMY  1970's  . COLONOSCOPY WITH ESOPHAGOGASTRODUODENOSCOPY (EGD)  04/23/2009   Dr. Jena Gauss: Distal erosive reflux esophagitis with noncritical Schatzki ring status post dilation. Small hiatal hernia. Mottling submucosal petechiae of the gastric mucosa, benign-appearing stellate shaped prepyloric antral ulcer both with benign biopsies and no H. pylori. On colonoscopy she had anal canal hemorrhoids, diminutive hyperplastic rectal polyp. Diverticulosis. Hepatic flexure polyp lost  . DILATION AND CURETTAGE OF UTERUS  12/1969  . erethra      ureter not urethra  . KIDNEY SURGERY  05/1976   S/P hysterectomy; cut ureter; had to repair kidney/bladder; thought they removed my  kidney for years; records were destroyed; developed problems; told insides looked like mush; cleaned out the infection and repaired leaking bladder I think" (08/31/2013)  . MULTIPLE EXTRACTIONS WITH ALVEOLOPLASTY Bilateral 04/27/2015   Procedure: EXTRACTIONS WITH BILATERAL TORI, MAX (R) TORI;  Surgeon: Ocie DoyneScott Jensen, DDS;  Location: [facility] OR;  Service: Oral Surgery;  Laterality: Bilateral;  . SACROILIAC JOINT FUSION Right 03/19/2016   Procedure: SACROILIAC JOINT FUSION;  Surgeon: Venita Lickahari [name],  MD;  [facility];  Service: Orthopedics;  Laterality: Right;  . WRIST SURGERY Right 1995    Current Outpatient Prescriptions  Medication Sig Dispense Refill  . albuterol (PROVENTIL HFA;VENTOLIN HFA) 108 (90 BASE) MCG/ACT inhaler Inhale 2 puffs into the lungs every 6 (six) hours as needed for wheezing or shortness of breath.    . ALPRAZolam (XANAX) 1 MG tablet Take 1 mg by mouth 3 (three) times daily.    Marland Kitchen. atorvastatin (LIPITOR) 10 MG tablet Take 10 mg by mouth daily.    Marland Kitchen. EPINEPHrine (EPIPEN 2-PAK) 0.3 mg/0.3 mL IJ SOAJ injection Inject 0.3 mg into the muscle once.    . gabapentin (NEURONTIN) 300 MG capsule Take 300 mg by mouth 3 (three) times daily.     Marland Kitchen. HYDROcodone-acetaminophen (NORCO) 10-325 MG tablet Take 1 tablet by mouth every 6 (six) hours as needed. (Patient taking differently: Take 1 tablet by mouth every 6 (six) hours as needed for moderate pain or severe pain. Up to 5 tablets daily) 60 tablet 0  . Menthol, Topical Analgesic, (ASPERCREME MAX ROLL-ON) 16 % LIQD Apply 1 application topically as needed (for pain).    Marland Kitchen. omeprazole (PRILOSEC) 40 MG capsule Take 40 mg by mouth daily.    Marland Kitchen. tiZANidine (ZANAFLEX) 4 MG tablet Take 4 mg by mouth 3 (three) times daily.     No current facility-administered medications for this visit.     Allergies as of 03/19/2017 - Review Complete 03/19/2017  Allergen Reaction Noted  . Bee venom Anaphylaxis 04/19/2015  . Codeine Anaphylaxis and Swelling 08/26/2014  . Penicillins Anaphylaxis, Swelling, and Other (See Comments) 01/22/2017    Family History  Problem Relation Age of Onset  . Heart disease Mother   . Hypertension Mother   . Colon cancer Neg Hx   . Inflammatory bowel disease Neg Hx     Social History   Social History  . Marital status: Married    Spouse name: N/A  . Number of children: N/A  . Years of education: N/A   Social History Main Topics  . Smoking status: Current Every Day Smoker    Packs/day: 0.25    Years: 50.00      Types: Cigarettes  . Smokeless tobacco: Never Used  . Alcohol use No     Comment: 08/31/2013 "drank a little years ago; last drink was probably in the 1990's"  . Drug use: Yes    Types: Marijuana     Comment: last used 01/21/17  . Sexual activity: No   Other Topics Concern  . None   Social History Narrative  . None    Review of Systems: Gen: Denies fever, chills, anorexia. Denies fatigue, weakness, weight loss.  CV: Denies chest pain, palpitations, syncope, peripheral edema, and claudication. Resp: Denies dyspnea at rest, cough, wheezing, coughing up blood, and pleurisy. GI: see HPI  Derm: Denies rash, itching, dry skin Psych: Denies depression, anxiety, memory loss, confusion. No homicidal or suicidal ideation.  Heme: Denies bruising, bleeding, and enlarged lymph nodes.  Physical Exam: BP 125/65  Pulse 74   Temp (!) 97.5 F (36.4 C) (Oral)   Ht  (1.626 m)   Wt 117 lb 6.4 oz (53.3 kg)   BMI 20.15 kg/m  General:   Alert and oriented. No distress noted. Pleasant and cooperative.  Head:  Normocephalic and atraumatic. Eyes:  Conjuctiva clear without scleral icterus. Mouth:  Oral mucosa pink and moist. Good dentition. No lesions. Abdomen:  +BS, soft, non-tender and non-distended. No rebound or guarding. No HSM or masses noted. Msk:  Symmetrical without gross deformities. Normal posture. Extremities:  Without edema. Neurologic:  Alert and  oriented x4 Psych:  Alert and cooperative. Normal mood and affect.  Lab Results  Component Value Date   WBC 6.1 01/24/2017   HGB 14.0 01/24/2017   HCT 41.8 01/24/2017   MCV 90.5 01/24/2017   PLT 218 01/24/2017

## 2017-04-11 ENCOUNTER — Encounter: Payer: Self-pay | Admitting: Internal Medicine

## 2017-04-13 ENCOUNTER — Encounter (HOSPITAL_COMMUNITY): Payer: Self-pay | Admitting: Internal Medicine

## 2017-04-24 DIAGNOSIS — Z23 Encounter for immunization: Secondary | ICD-10-CM | POA: Diagnosis not present

## 2017-04-24 DIAGNOSIS — Z6820 Body mass index (BMI) 20.0-20.9, adult: Secondary | ICD-10-CM | POA: Diagnosis not present

## 2017-04-24 DIAGNOSIS — Z1389 Encounter for screening for other disorder: Secondary | ICD-10-CM | POA: Diagnosis not present

## 2017-04-24 DIAGNOSIS — F329 Major depressive disorder, single episode, unspecified: Secondary | ICD-10-CM | POA: Diagnosis not present

## 2017-04-24 DIAGNOSIS — G4700 Insomnia, unspecified: Secondary | ICD-10-CM | POA: Diagnosis not present

## 2017-04-24 DIAGNOSIS — G894 Chronic pain syndrome: Secondary | ICD-10-CM | POA: Diagnosis not present

## 2017-05-19 ENCOUNTER — Ambulatory Visit: Payer: PPO | Admitting: Gastroenterology

## 2017-05-27 DIAGNOSIS — Z0000 Encounter for general adult medical examination without abnormal findings: Secondary | ICD-10-CM | POA: Diagnosis not present

## 2017-05-27 DIAGNOSIS — Z6821 Body mass index (BMI) 21.0-21.9, adult: Secondary | ICD-10-CM | POA: Diagnosis not present

## 2017-05-27 DIAGNOSIS — Z1389 Encounter for screening for other disorder: Secondary | ICD-10-CM | POA: Diagnosis not present

## 2017-05-27 DIAGNOSIS — F329 Major depressive disorder, single episode, unspecified: Secondary | ICD-10-CM | POA: Diagnosis not present

## 2017-07-22 DIAGNOSIS — G894 Chronic pain syndrome: Secondary | ICD-10-CM | POA: Diagnosis not present

## 2017-07-22 DIAGNOSIS — F419 Anxiety disorder, unspecified: Secondary | ICD-10-CM | POA: Diagnosis not present

## 2017-07-22 DIAGNOSIS — Z6821 Body mass index (BMI) 21.0-21.9, adult: Secondary | ICD-10-CM | POA: Diagnosis not present

## 2017-10-16 DIAGNOSIS — F419 Anxiety disorder, unspecified: Secondary | ICD-10-CM | POA: Diagnosis not present

## 2017-10-16 DIAGNOSIS — G894 Chronic pain syndrome: Secondary | ICD-10-CM | POA: Diagnosis not present

## 2017-10-16 DIAGNOSIS — J449 Chronic obstructive pulmonary disease, unspecified: Secondary | ICD-10-CM | POA: Diagnosis not present

## 2017-10-16 DIAGNOSIS — K219 Gastro-esophageal reflux disease without esophagitis: Secondary | ICD-10-CM | POA: Diagnosis not present

## 2017-10-16 DIAGNOSIS — I700 Atherosclerosis of aorta: Secondary | ICD-10-CM | POA: Diagnosis not present

## 2017-10-16 DIAGNOSIS — Z6821 Body mass index (BMI) 21.0-21.9, adult: Secondary | ICD-10-CM | POA: Diagnosis not present

## 2018-01-01 DIAGNOSIS — R7309 Other abnormal glucose: Secondary | ICD-10-CM | POA: Diagnosis not present

## 2018-01-01 DIAGNOSIS — E782 Mixed hyperlipidemia: Secondary | ICD-10-CM | POA: Diagnosis not present

## 2018-01-01 DIAGNOSIS — Z6820 Body mass index (BMI) 20.0-20.9, adult: Secondary | ICD-10-CM | POA: Diagnosis not present

## 2018-01-01 DIAGNOSIS — J449 Chronic obstructive pulmonary disease, unspecified: Secondary | ICD-10-CM | POA: Diagnosis not present

## 2018-01-01 DIAGNOSIS — E748 Other specified disorders of carbohydrate metabolism: Secondary | ICD-10-CM | POA: Diagnosis not present

## 2018-01-01 DIAGNOSIS — K219 Gastro-esophageal reflux disease without esophagitis: Secondary | ICD-10-CM | POA: Diagnosis not present

## 2018-01-01 DIAGNOSIS — Z79891 Long term (current) use of opiate analgesic: Secondary | ICD-10-CM | POA: Diagnosis not present

## 2018-01-01 DIAGNOSIS — I700 Atherosclerosis of aorta: Secondary | ICD-10-CM | POA: Diagnosis not present

## 2018-01-01 DIAGNOSIS — G894 Chronic pain syndrome: Secondary | ICD-10-CM | POA: Diagnosis not present

## 2018-01-18 DIAGNOSIS — E782 Mixed hyperlipidemia: Secondary | ICD-10-CM | POA: Diagnosis not present

## 2018-01-18 DIAGNOSIS — I10 Essential (primary) hypertension: Secondary | ICD-10-CM | POA: Diagnosis not present

## 2018-01-18 DIAGNOSIS — J449 Chronic obstructive pulmonary disease, unspecified: Secondary | ICD-10-CM | POA: Diagnosis not present

## 2018-01-18 DIAGNOSIS — F419 Anxiety disorder, unspecified: Secondary | ICD-10-CM | POA: Diagnosis not present

## 2018-02-23 ENCOUNTER — Other Ambulatory Visit (HOSPITAL_COMMUNITY): Payer: Self-pay | Admitting: Internal Medicine

## 2018-02-23 DIAGNOSIS — Z1231 Encounter for screening mammogram for malignant neoplasm of breast: Secondary | ICD-10-CM

## 2018-03-01 ENCOUNTER — Ambulatory Visit (HOSPITAL_COMMUNITY)
Admission: RE | Admit: 2018-03-01 | Discharge: 2018-03-01 | Disposition: A | Discharge status: Home / Self Care | Payer: PPO | Source: Ambulatory Visit | Attending: Internal Medicine | Admitting: Internal Medicine

## 2018-03-01 ENCOUNTER — Encounter (HOSPITAL_COMMUNITY): Payer: Self-pay

## 2018-03-01 DIAGNOSIS — Z1231 Encounter for screening mammogram for malignant neoplasm of breast: Secondary | ICD-10-CM | POA: Diagnosis not present

## 2018-03-23 DIAGNOSIS — Z1389 Encounter for screening for other disorder: Secondary | ICD-10-CM | POA: Diagnosis not present

## 2018-03-23 DIAGNOSIS — F329 Major depressive disorder, single episode, unspecified: Secondary | ICD-10-CM | POA: Diagnosis not present

## 2018-03-23 DIAGNOSIS — K219 Gastro-esophageal reflux disease without esophagitis: Secondary | ICD-10-CM | POA: Diagnosis not present

## 2018-03-23 DIAGNOSIS — Z0001 Encounter for general adult medical examination with abnormal findings: Secondary | ICD-10-CM | POA: Diagnosis not present

## 2018-03-23 DIAGNOSIS — F419 Anxiety disorder, unspecified: Secondary | ICD-10-CM | POA: Diagnosis not present

## 2018-03-23 DIAGNOSIS — Z6820 Body mass index (BMI) 20.0-20.9, adult: Secondary | ICD-10-CM | POA: Diagnosis not present

## 2018-03-23 DIAGNOSIS — J449 Chronic obstructive pulmonary disease, unspecified: Secondary | ICD-10-CM | POA: Diagnosis not present

## 2018-03-23 DIAGNOSIS — G894 Chronic pain syndrome: Secondary | ICD-10-CM | POA: Diagnosis not present

## 2018-03-25 ENCOUNTER — Other Ambulatory Visit (HOSPITAL_COMMUNITY): Payer: Self-pay | Admitting: Internal Medicine

## 2018-03-25 DIAGNOSIS — E2839 Other primary ovarian failure: Secondary | ICD-10-CM

## 2018-03-30 ENCOUNTER — Other Ambulatory Visit (HOSPITAL_COMMUNITY): Payer: PPO

## 2018-03-30 ENCOUNTER — Encounter (HOSPITAL_COMMUNITY): Payer: Self-pay

## 2018-05-24 DIAGNOSIS — K219 Gastro-esophageal reflux disease without esophagitis: Secondary | ICD-10-CM | POA: Diagnosis not present

## 2018-05-24 DIAGNOSIS — G894 Chronic pain syndrome: Secondary | ICD-10-CM | POA: Diagnosis not present

## 2018-05-24 DIAGNOSIS — F419 Anxiety disorder, unspecified: Secondary | ICD-10-CM | POA: Diagnosis not present

## 2018-05-24 DIAGNOSIS — Z6820 Body mass index (BMI) 20.0-20.9, adult: Secondary | ICD-10-CM | POA: Diagnosis not present

## 2018-05-24 DIAGNOSIS — J449 Chronic obstructive pulmonary disease, unspecified: Secondary | ICD-10-CM | POA: Diagnosis not present

## 2018-08-18 DIAGNOSIS — G894 Chronic pain syndrome: Secondary | ICD-10-CM | POA: Diagnosis not present

## 2018-08-18 DIAGNOSIS — F419 Anxiety disorder, unspecified: Secondary | ICD-10-CM | POA: Diagnosis not present

## 2018-08-18 DIAGNOSIS — Z23 Encounter for immunization: Secondary | ICD-10-CM | POA: Diagnosis not present

## 2018-08-18 DIAGNOSIS — E7849 Other hyperlipidemia: Secondary | ICD-10-CM | POA: Diagnosis not present

## 2018-08-18 DIAGNOSIS — Z1389 Encounter for screening for other disorder: Secondary | ICD-10-CM | POA: Diagnosis not present

## 2018-08-18 DIAGNOSIS — I10 Essential (primary) hypertension: Secondary | ICD-10-CM | POA: Diagnosis not present

## 2018-08-18 DIAGNOSIS — Z681 Body mass index (BMI) 19 or less, adult: Secondary | ICD-10-CM | POA: Diagnosis not present

## 2018-08-18 DIAGNOSIS — Z0001 Encounter for general adult medical examination with abnormal findings: Secondary | ICD-10-CM | POA: Diagnosis not present

## 2018-08-18 DIAGNOSIS — K219 Gastro-esophageal reflux disease without esophagitis: Secondary | ICD-10-CM | POA: Diagnosis not present

## 2018-10-26 DIAGNOSIS — I2510 Atherosclerotic heart disease of native coronary artery without angina pectoris: Secondary | ICD-10-CM | POA: Diagnosis not present

## 2018-10-26 DIAGNOSIS — M810 Age-related osteoporosis without current pathological fracture: Secondary | ICD-10-CM | POA: Diagnosis not present

## 2018-10-26 DIAGNOSIS — K219 Gastro-esophageal reflux disease without esophagitis: Secondary | ICD-10-CM | POA: Diagnosis not present

## 2018-10-26 DIAGNOSIS — E7849 Other hyperlipidemia: Secondary | ICD-10-CM | POA: Diagnosis not present

## 2018-10-26 DIAGNOSIS — I10 Essential (primary) hypertension: Secondary | ICD-10-CM | POA: Diagnosis not present

## 2018-10-26 DIAGNOSIS — R42 Dizziness and giddiness: Secondary | ICD-10-CM | POA: Diagnosis not present

## 2018-10-26 DIAGNOSIS — Z681 Body mass index (BMI) 19 or less, adult: Secondary | ICD-10-CM | POA: Diagnosis not present

## 2018-10-26 DIAGNOSIS — Z720 Tobacco use: Secondary | ICD-10-CM | POA: Diagnosis not present

## 2018-10-26 DIAGNOSIS — G894 Chronic pain syndrome: Secondary | ICD-10-CM | POA: Diagnosis not present

## 2018-10-26 DIAGNOSIS — E669 Obesity, unspecified: Secondary | ICD-10-CM | POA: Diagnosis not present

## 2018-10-26 DIAGNOSIS — I493 Ventricular premature depolarization: Secondary | ICD-10-CM | POA: Diagnosis not present

## 2018-10-26 DIAGNOSIS — F209 Schizophrenia, unspecified: Secondary | ICD-10-CM | POA: Diagnosis not present

## 2018-11-29 DIAGNOSIS — Z681 Body mass index (BMI) 19 or less, adult: Secondary | ICD-10-CM | POA: Diagnosis not present

## 2018-11-29 DIAGNOSIS — Z1389 Encounter for screening for other disorder: Secondary | ICD-10-CM | POA: Diagnosis not present

## 2018-11-29 DIAGNOSIS — K219 Gastro-esophageal reflux disease without esophagitis: Secondary | ICD-10-CM | POA: Diagnosis not present

## 2018-11-29 DIAGNOSIS — J449 Chronic obstructive pulmonary disease, unspecified: Secondary | ICD-10-CM | POA: Diagnosis not present

## 2018-11-29 DIAGNOSIS — G894 Chronic pain syndrome: Secondary | ICD-10-CM | POA: Diagnosis not present

## 2018-11-29 DIAGNOSIS — I10 Essential (primary) hypertension: Secondary | ICD-10-CM | POA: Diagnosis not present

## 2018-12-22 DIAGNOSIS — J449 Chronic obstructive pulmonary disease, unspecified: Secondary | ICD-10-CM | POA: Diagnosis not present

## 2018-12-22 DIAGNOSIS — I10 Essential (primary) hypertension: Secondary | ICD-10-CM | POA: Diagnosis not present

## 2018-12-22 DIAGNOSIS — S79911A Unspecified injury of right hip, initial encounter: Secondary | ICD-10-CM | POA: Diagnosis not present

## 2018-12-22 DIAGNOSIS — Z681 Body mass index (BMI) 19 or less, adult: Secondary | ICD-10-CM | POA: Diagnosis not present

## 2018-12-22 DIAGNOSIS — F17290 Nicotine dependence, other tobacco product, uncomplicated: Secondary | ICD-10-CM | POA: Diagnosis not present

## 2018-12-22 DIAGNOSIS — K219 Gastro-esophageal reflux disease without esophagitis: Secondary | ICD-10-CM | POA: Diagnosis not present

## 2018-12-23 ENCOUNTER — Other Ambulatory Visit (HOSPITAL_COMMUNITY): Payer: Self-pay | Admitting: Internal Medicine

## 2018-12-23 ENCOUNTER — Other Ambulatory Visit: Payer: Self-pay | Admitting: Internal Medicine

## 2018-12-23 DIAGNOSIS — R0989 Other specified symptoms and signs involving the circulatory and respiratory systems: Secondary | ICD-10-CM

## 2018-12-29 ENCOUNTER — Other Ambulatory Visit: Payer: Self-pay

## 2018-12-29 ENCOUNTER — Ambulatory Visit (HOSPITAL_COMMUNITY)
Admission: RE | Admit: 2018-12-29 | Discharge: 2018-12-29 | Disposition: A | Discharge status: Home / Self Care | Payer: PPO | Source: Ambulatory Visit | Attending: Internal Medicine | Admitting: Internal Medicine

## 2018-12-29 ENCOUNTER — Other Ambulatory Visit (HOSPITAL_COMMUNITY): Payer: Self-pay | Admitting: Internal Medicine

## 2018-12-29 DIAGNOSIS — S79911A Unspecified injury of right hip, initial encounter: Secondary | ICD-10-CM

## 2018-12-29 DIAGNOSIS — I6523 Occlusion and stenosis of bilateral carotid arteries: Secondary | ICD-10-CM | POA: Diagnosis not present

## 2018-12-29 DIAGNOSIS — R0989 Other specified symptoms and signs involving the circulatory and respiratory systems: Secondary | ICD-10-CM | POA: Diagnosis not present

## 2018-12-29 DIAGNOSIS — M25551 Pain in right hip: Secondary | ICD-10-CM | POA: Diagnosis not present

## 2018-12-30 ENCOUNTER — Telehealth: Payer: Self-pay | Admitting: Cardiovascular Disease

## 2018-12-30 NOTE — Telephone Encounter (Signed)
Virtual Visit Pre-Appointment Phone Call  "(Name), I am calling you today to discuss your upcoming appointment. We are currently trying to limit exposure to the virus that causes COVID-19 by seeing patients at home rather than in the office."  1. "What is the BEST phone number to call the day of the visit?" - include this in appointment notes  2. Do you have or have access to (through a family member/friend) a smartphone with video capability that we can use for your visit?" a. If yes - list this number in appt notes as cell (if different from BEST phone #) and list the appointment type as a VIDEO visit in appointment notes b. If no - list the appointment type as a PHONE visit in appointment notes  3. Confirm consent - "In the setting of the current Covid19 crisis, you are scheduled for a (phone or video) visit with your provider on (date) at (time).  Just as we do with many in-office visits, in order for you to participate in this visit, we must obtain consent.  If you'd like, I can send this to your mychart (if signed up) or email for you to review.  Otherwise, I can obtain your verbal consent now.  All virtual visits are billed to your insurance company just like a normal visit would be.  By agreeing to a virtual visit, we'd like you to understand that the technology does not allow for your provider to perform an examination, and thus may limit your provider's ability to fully assess your condition. If your provider identifies any concerns that need to be evaluated in person, we will make arrangements to do so.  Finally, though the technology is pretty good, we cannot assure that it will always work on either your or our end, and in the setting of a video visit, we may have to convert it to a phone-only visit.  In either situation, we cannot ensure that we have a secure connection.  Are you willing to proceed?" STAFF: Did the patient verbally acknowledge consent to telehealth visit? Document  YES/NO here: Yes  4. Advise patient to be prepared - "Two hours prior to your appointment, go ahead and check your blood pressure, pulse, oxygen saturation, and your weight (if you have the equipment to check those) and write them all down. When your visit starts, your provider will ask you for this information. If you have an Apple Watch or Kardia device, please plan to have heart rate information ready on the day of your appointment. Please have a pen and paper handy nearby the day of the visit as well."  5. Give patient instructions for MyChart download to smartphone OR [email or web address] as below if video visit (depending on what platform provider is using)  6. Inform patient they will receive a phone call 15 minutes prior to their appointment time (may be from unknown caller ID) so they should be prepared to answer    TELEPHONE CALL NOTE  Elizabeth Fowler has been deemed a candidate for a follow-up tele-health visit to limit community exposure during the Covid-19 pandemic. I spoke with the patient via phone to ensure availability of phone/video source, confirm preferred email & phone number, and discuss instructions and expectations.  I reminded Elizabeth Fowler to be prepared with any vital sign and/or heart rhythm information that could potentially be obtained via home monitoring, at the time of her visit. I reminded Elizabeth Fowler to expect a phone call prior to  her visit.  Dyane Dustmanerry L Goins 12/30/2018 9:38 AM

## 2018-12-31 NOTE — Progress Notes (Signed)
Virtual Visit via Telephone Note   This visit type was conducted due to national recommendations for restrictions regarding the COVID-19 Pandemic (e.g. social distancing) in an effort to limit this patient's exposure and mitigate transmission in our community.  Due to her co-morbid illnesses, this patient is at least at moderate risk for complications without adequate follow up.  This format is felt to be most appropriate for this patient at this time.  The patient did not have access to video technology/had technical difficulties with video requiring transitioning to audio format only (telephone).  All issues noted in this document were discussed and addressed.  No physical exam could be performed with this format.  Please refer to the patient's chart for her  consent to telehealth for [facility].   Date:  01/06/2019   ID:  ERNIE SAGRERO, DOB Jul 25, 1950, MRN [id number]  Patient Location: Home Provider Location: Office  PCP:  Redmond School, MD  Cardiologist:   Johnsie Cancel Electrophysiologist:  None   Evaluation Performed:  New Patient Evaluation  Chief Complaint:  Syncope  History of Present Illness:    68 y.o. referred by Dr Gerarda Fraction for syncope She is a smoker with history of MVP and mild MR dating back to 2005 Also on statin for HLD Reviewed his note from June 3 Got out of chair to open blind Felt funny ? Passed out Husband helped her off floor Occurred in afternoon Felt fine afterward No seizure activity 2 weeks latter occurred walking into bedroom Hit head on dresser  No palpitations chest pain or dyspnea    The patient  does not have symptoms concerning for COVID-19 infection (fever, chills, cough, or new shortness of breath).    Past Medical History:  Diagnosis Date  . Acid reflux    occ  . Anxiety   . Arthritis    "right hip" (08/31/2013)  . Chronic bronchitis ([place])    "got it q yr til pneumonia shot given" (08/31/2013)  . Chronic lower back pain   . Complication of  anesthesia    pt. states a difficult time waking up-last 2 surgeries  . COPD (chronic obstructive pulmonary disease) ([place])   . Depression   . Heart murmur    10 or more yrs ago echo neg unable to remember where  . High cholesterol   . Migraines    "goes to bed dark room; last migraine yrs ago hx  . MVP (mitral valve prolapse)    mild anterior leafleat MVP with mild MR 02/26/04 echo  . OCD (obsessive compulsive disorder)   . Pneumonia    "I've had pneumonia probably 30 times" (08/31/2013)  . Shingles 2016  . UTI (lower urinary tract infection) 03/14/2016   starting rx today   Past Surgical History:  Procedure Laterality Date  . ABDOMINAL EXPOSURE N/A 08/31/2013   Procedure: ABDOMINAL EXPOSURE;  Surgeon: Rosetta Posner, MD;  Location: [place];  Service: Vascular;  Laterality: N/A;  . ABDOMINAL HYSTERECTOMY  05/1976  . ANTERIOR CERVICAL DECOMP/DISCECTOMY FUSION N/A 07/06/2014   Procedure: ANTERIOR CERVICAL DECOMPRESSION/DISCECTOMY FUSION 1 LEVEL;  Surgeon: Melina Schools, MD;  Location: [place];  Service: Orthopedics;  Laterality: N/A;  . ANTERIOR LUMBAR FUSION N/A 08/31/2013   Procedure: ALIF L5 - S1 1 LEVEL;  Surgeon: Melina Schools, MD;  Location: [place];  Service: Orthopedics;  Laterality: N/A;  . APPENDECTOMY  1970's  . CARPAL TUNNEL RELEASE Left   . CHOLECYSTECTOMY  1970's  . COLONOSCOPY WITH ESOPHAGOGASTRODUODENOSCOPY (EGD)  04/23/2009  Dr. Jena Gaussourk: Distal erosive reflux esophagitis with noncritical Schatzki ring status post dilation. Small hiatal hernia. Mottling submucosal petechiae of the gastric mucosa, benign-appearing stellate shaped prepyloric antral ulcer both with benign biopsies and no H. pylori. On colonoscopy she had anal canal hemorrhoids, diminutive hyperplastic rectal polyp. Diverticulosis. Hepatic flexure polyp lost  . COLONOSCOPY WITH PROPOFOL N/A 04/09/2017   Procedure: COLONOSCOPY WITH PROPOFOL;  Surgeon: Corbin Adeourk, Robert M, MD;  [facility];  Service: Endoscopy;   Laterality: N/A;  115  . DILATION AND CURETTAGE OF UTERUS  12/1969  . erethra      ureter not urethra  . KIDNEY SURGERY  05/1976   S/P hysterectomy; cut ureter; had to repair kidney/bladder; thought they removed my kidney for years; records were destroyed; developed problems; told insides looked like mush; cleaned out the infection and repaired leaking bladder I think" (08/31/2013)  . MULTIPLE EXTRACTIONS WITH ALVEOLOPLASTY Bilateral 04/27/2015   Procedure: EXTRACTIONS WITH BILATERAL TORI, MAX (R) TORI;  Surgeon: Ocie DoyneScott Jensen, DDS;  [facility];  Service: Oral Surgery;  Laterality: Bilateral;  . POLYPECTOMY  04/09/2017   Procedure: POLYPECTOMY;  Surgeon: Corbin Adeourk, Robert M, MD;  [facility];  Service: Endoscopy;;  colon  . SACROILIAC JOINT FUSION Right 03/19/2016   Procedure: SACROILIAC JOINT FUSION;  Surgeon: Venita Lickahari [name], MD;  [facility];  Service: Orthopedics;  Laterality: Right;  . WRIST SURGERY Right 1995     Current Meds  Medication Sig  . albuterol (PROVENTIL HFA;VENTOLIN HFA) 108 (90 BASE) MCG/ACT inhaler Inhale 2 puffs into the lungs every 6 (six) hours as needed for wheezing or shortness of breath.  . ALPRAZolam (XANAX) 1 MG tablet Take 1 mg by mouth 3 (three) times daily as needed for anxiety.   Marland Kitchen. atorvastatin (LIPITOR) 10 MG tablet Take 10 mg by mouth daily.  Marland Kitchen. EPINEPHrine (EPIPEN 2-PAK) 0.3 mg/0.3 mL IJ SOAJ injection Inject 0.3 mg into the muscle as needed (allergic reaction).   . gabapentin (NEURONTIN) 300 MG capsule Take 300 mg by mouth 3 (three) times daily.   Marland Kitchen. HYDROcodone-acetaminophen (NORCO) 10-325 MG tablet Take 1 tablet by mouth every 6 (six) hours as needed. (Patient taking differently: Take 1 tablet by mouth every 6 (six) hours as needed for severe pain. Up to 5 tablets daily)  . Menthol, Topical Analgesic, (ASPERCREME MAX ROLL-ON) 16 % LIQD Apply 1 application topically at bedtime as needed (for pain).   Marland Kitchen. omeprazole (PRILOSEC) 40 MG capsule Take 40 mg  by mouth daily.  . polyethylene glycol-electrolytes (TRILYTE) 420 g solution Take 4,000 mLs by mouth as directed.  Marland Kitchen. tiZANidine (ZANAFLEX) 4 MG tablet Take 4 mg by mouth 3 (three) times daily.     Allergies:   Bee venom, Codeine, and Penicillins   Social History   Tobacco Use  . Smoking status: Current Every Day Smoker    Packs/day: 0.25    Years: 50.00    Pack years: 12.50    Types: Cigarettes  . Smokeless tobacco: Never Used  Substance Use Topics  . Alcohol use: No    Comment: 08/31/2013 "drank a little years ago; last drink was probably in the 1990's"  . Drug use: Yes    Types: Marijuana    Comment: last used 03/31/2017     Family Hx: The patient's family history includes Heart disease in her mother; Hypertension in her mother. There is no history of Colon cancer or Inflammatory bowel disease.  ROS:   Please see the history of present  illness.     All other systems reviewed and are negative.   Prior CV studies:   The following studies were reviewed today:  Carotid Duplex 07/2018 plaque no stenosis   Labs/Other Tests and Data Reviewed:    EKG:  March 2018 SR ICRBBB non specific ST changes   Recent Labs: No results found for requested labs within last 8760 hours.   Recent Lipid Panel No results found for: CHOL, TRIG, HDL, CHOLHDL, LDLCALC, LDLDIRECT  Wt Readings from Last 3 Encounters:  01/06/19 44 kg  04/01/17 52.8 kg  03/19/17 53.3 kg     Objective:    Vital Signs:  BP (!) 118/92   Pulse 92   Ht 5\' 1"  (1.549 m)   Wt 44 kg   BMI 18.33 kg/m    Telephone visit no exam   ASSESSMENT & PLAN:    Syncope:  Does not sound cardiac no need for monitoring in abssence of symptoms and palpitations See below regarding echo Encouraged her to have primary refer to neurology for ? EEG and CT head  HLD:  On statin labs with primary  Smoking: with some component of COPD Has inhalers Discussed lung cancer screening CT   MVP:  With history of mild MR in 2005 given #1  above echo will be ordered    COVID-19 Education: The signs and symptoms of COVID-19 were discussed with the patient and how to seek care for testing (follow up with PCP or arrange E-visit).  The importance of social distancing was discussed today.  Time:   Today, I have spent 30 minutes with the patient with telehealth technology discussing the above problems.     Medication Adjustments/Labs and Tests Ordered: Current medicines are reviewed at length with the patient today.  Concerns regarding medicines are outlined above.   Tests Ordered:  Echo for MVP/MR and Syncope   Medication Changes:  None   Disposition:  Follow up prn  Signed, Charlton HawsPeter Khambrel Amsden, MD  01/06/2019 1:44 PM    [facility]

## 2019-01-03 DIAGNOSIS — Z981 Arthrodesis status: Secondary | ICD-10-CM | POA: Diagnosis not present

## 2019-01-03 DIAGNOSIS — M545 Low back pain: Secondary | ICD-10-CM | POA: Diagnosis not present

## 2019-01-06 ENCOUNTER — Other Ambulatory Visit: Payer: Self-pay

## 2019-01-06 ENCOUNTER — Telehealth (INDEPENDENT_AMBULATORY_CARE_PROVIDER_SITE_OTHER): Payer: PPO | Admitting: Cardiovascular Disease

## 2019-01-06 VITALS — BP 118/92 | HR 92 | Ht 61.0 in | Wt 97.0 lb

## 2019-01-06 DIAGNOSIS — I341 Nonrheumatic mitral (valve) prolapse: Secondary | ICD-10-CM

## 2019-01-06 DIAGNOSIS — R55 Syncope and collapse: Secondary | ICD-10-CM

## 2019-01-06 NOTE — Patient Instructions (Signed)
Medication Instructions:  Your physician recommends that you continue on your current medications as directed. Please refer to the Current Medication list given to you today.   Labwork: NONE  Testing/Procedures: Your physician has requested that you have an echocardiogram. Echocardiography is a painless test that uses sound waves to create images of your heart. It provides your doctor with information about the size and shape of your heart and how well your heart's chambers and valves are working. This procedure takes approximately one hour. There are no restrictions for this procedure.    Follow-Up: Your physician recommends that you schedule a follow-up appointment in: AS NEEDED    Any Other Special Instructions Will Be Listed Below (If Applicable).     If you need a refill on your cardiac medications before your next appointment, please call your pharmacy.   

## 2019-01-06 NOTE — Addendum Note (Signed)
Addended by: Debbora Lacrosse R on: 01/06/2019 02:16 PM   Modules accepted: Orders

## 2019-01-19 DIAGNOSIS — Z681 Body mass index (BMI) 19 or less, adult: Secondary | ICD-10-CM | POA: Diagnosis not present

## 2019-01-19 DIAGNOSIS — G894 Chronic pain syndrome: Secondary | ICD-10-CM | POA: Diagnosis not present

## 2019-01-19 DIAGNOSIS — F329 Major depressive disorder, single episode, unspecified: Secondary | ICD-10-CM | POA: Diagnosis not present

## 2019-01-19 DIAGNOSIS — F419 Anxiety disorder, unspecified: Secondary | ICD-10-CM | POA: Diagnosis not present

## 2019-01-28 ENCOUNTER — Ambulatory Visit (HOSPITAL_COMMUNITY): Payer: PPO

## 2019-02-28 DIAGNOSIS — Z681 Body mass index (BMI) 19 or less, adult: Secondary | ICD-10-CM | POA: Diagnosis not present

## 2019-02-28 DIAGNOSIS — G894 Chronic pain syndrome: Secondary | ICD-10-CM | POA: Diagnosis not present

## 2019-02-28 DIAGNOSIS — K219 Gastro-esophageal reflux disease without esophagitis: Secondary | ICD-10-CM | POA: Diagnosis not present

## 2019-02-28 DIAGNOSIS — J449 Chronic obstructive pulmonary disease, unspecified: Secondary | ICD-10-CM | POA: Diagnosis not present

## 2019-02-28 DIAGNOSIS — I10 Essential (primary) hypertension: Secondary | ICD-10-CM | POA: Diagnosis not present

## 2019-04-20 ENCOUNTER — Encounter: Payer: Self-pay | Admitting: Internal Medicine

## 2019-05-24 DIAGNOSIS — Z23 Encounter for immunization: Secondary | ICD-10-CM | POA: Diagnosis not present

## 2019-05-24 DIAGNOSIS — G894 Chronic pain syndrome: Secondary | ICD-10-CM | POA: Diagnosis not present

## 2019-05-24 DIAGNOSIS — Z681 Body mass index (BMI) 19 or less, adult: Secondary | ICD-10-CM | POA: Diagnosis not present

## 2019-08-09 ENCOUNTER — Other Ambulatory Visit (HOSPITAL_COMMUNITY): Payer: Self-pay | Admitting: Internal Medicine

## 2019-08-09 DIAGNOSIS — J438 Other emphysema: Secondary | ICD-10-CM | POA: Diagnosis not present

## 2019-08-09 DIAGNOSIS — Z1389 Encounter for screening for other disorder: Secondary | ICD-10-CM | POA: Diagnosis not present

## 2019-08-09 DIAGNOSIS — G894 Chronic pain syndrome: Secondary | ICD-10-CM | POA: Diagnosis not present

## 2019-08-09 DIAGNOSIS — E782 Mixed hyperlipidemia: Secondary | ICD-10-CM | POA: Diagnosis not present

## 2019-08-09 DIAGNOSIS — Z0000 Encounter for general adult medical examination without abnormal findings: Secondary | ICD-10-CM | POA: Diagnosis not present

## 2019-08-09 DIAGNOSIS — Z681 Body mass index (BMI) 19 or less, adult: Secondary | ICD-10-CM | POA: Diagnosis not present

## 2019-08-09 DIAGNOSIS — I700 Atherosclerosis of aorta: Secondary | ICD-10-CM | POA: Diagnosis not present

## 2019-08-09 DIAGNOSIS — F419 Anxiety disorder, unspecified: Secondary | ICD-10-CM | POA: Diagnosis not present

## 2019-08-09 DIAGNOSIS — G8929 Other chronic pain: Secondary | ICD-10-CM

## 2019-08-09 DIAGNOSIS — K219 Gastro-esophageal reflux disease without esophagitis: Secondary | ICD-10-CM | POA: Diagnosis not present

## 2019-08-09 DIAGNOSIS — J449 Chronic obstructive pulmonary disease, unspecified: Secondary | ICD-10-CM | POA: Diagnosis not present

## 2019-08-09 DIAGNOSIS — E7849 Other hyperlipidemia: Secondary | ICD-10-CM | POA: Diagnosis not present

## 2019-08-09 DIAGNOSIS — I10 Essential (primary) hypertension: Secondary | ICD-10-CM | POA: Diagnosis not present

## 2019-08-13 DIAGNOSIS — Z1211 Encounter for screening for malignant neoplasm of colon: Secondary | ICD-10-CM | POA: Diagnosis not present

## 2019-09-28 ENCOUNTER — Other Ambulatory Visit (HOSPITAL_COMMUNITY): Payer: Self-pay | Admitting: Internal Medicine

## 2019-09-28 DIAGNOSIS — R634 Abnormal weight loss: Secondary | ICD-10-CM | POA: Diagnosis not present

## 2019-09-28 DIAGNOSIS — G894 Chronic pain syndrome: Secondary | ICD-10-CM | POA: Diagnosis not present

## 2019-09-28 DIAGNOSIS — Z0000 Encounter for general adult medical examination without abnormal findings: Secondary | ICD-10-CM | POA: Diagnosis not present

## 2019-09-28 DIAGNOSIS — F329 Major depressive disorder, single episode, unspecified: Secondary | ICD-10-CM | POA: Diagnosis not present

## 2019-09-28 DIAGNOSIS — K219 Gastro-esophageal reflux disease without esophagitis: Secondary | ICD-10-CM | POA: Diagnosis not present

## 2019-09-28 DIAGNOSIS — R109 Unspecified abdominal pain: Secondary | ICD-10-CM | POA: Diagnosis not present

## 2019-09-28 DIAGNOSIS — Z1389 Encounter for screening for other disorder: Secondary | ICD-10-CM | POA: Diagnosis not present

## 2019-09-28 DIAGNOSIS — R64 Cachexia: Secondary | ICD-10-CM | POA: Diagnosis not present

## 2019-09-28 DIAGNOSIS — Z719 Counseling, unspecified: Secondary | ICD-10-CM | POA: Diagnosis not present

## 2019-09-28 DIAGNOSIS — I10 Essential (primary) hypertension: Secondary | ICD-10-CM | POA: Diagnosis not present

## 2019-09-28 DIAGNOSIS — F17290 Nicotine dependence, other tobacco product, uncomplicated: Secondary | ICD-10-CM | POA: Diagnosis not present

## 2019-09-28 DIAGNOSIS — Z681 Body mass index (BMI) 19 or less, adult: Secondary | ICD-10-CM | POA: Diagnosis not present

## 2019-10-10 DIAGNOSIS — E782 Mixed hyperlipidemia: Secondary | ICD-10-CM | POA: Diagnosis not present

## 2019-10-19 DIAGNOSIS — G894 Chronic pain syndrome: Secondary | ICD-10-CM | POA: Diagnosis not present

## 2019-10-19 DIAGNOSIS — I10 Essential (primary) hypertension: Secondary | ICD-10-CM | POA: Diagnosis not present

## 2019-10-19 DIAGNOSIS — Z87891 Personal history of nicotine dependence: Secondary | ICD-10-CM | POA: Diagnosis not present

## 2019-10-19 DIAGNOSIS — J449 Chronic obstructive pulmonary disease, unspecified: Secondary | ICD-10-CM | POA: Diagnosis not present

## 2019-11-22 DIAGNOSIS — J449 Chronic obstructive pulmonary disease, unspecified: Secondary | ICD-10-CM | POA: Diagnosis not present

## 2019-11-22 DIAGNOSIS — Z681 Body mass index (BMI) 19 or less, adult: Secondary | ICD-10-CM | POA: Diagnosis not present

## 2019-11-22 DIAGNOSIS — K219 Gastro-esophageal reflux disease without esophagitis: Secondary | ICD-10-CM | POA: Diagnosis not present

## 2019-11-22 DIAGNOSIS — G894 Chronic pain syndrome: Secondary | ICD-10-CM | POA: Diagnosis not present

## 2019-11-22 DIAGNOSIS — R64 Cachexia: Secondary | ICD-10-CM | POA: Diagnosis not present

## 2019-11-22 DIAGNOSIS — E43 Unspecified severe protein-calorie malnutrition: Secondary | ICD-10-CM | POA: Diagnosis not present

## 2019-11-22 DIAGNOSIS — Z1389 Encounter for screening for other disorder: Secondary | ICD-10-CM | POA: Diagnosis not present

## 2019-11-22 DIAGNOSIS — R109 Unspecified abdominal pain: Secondary | ICD-10-CM | POA: Diagnosis not present

## 2019-11-22 DIAGNOSIS — I10 Essential (primary) hypertension: Secondary | ICD-10-CM | POA: Diagnosis not present

## 2019-11-22 DIAGNOSIS — R195 Other fecal abnormalities: Secondary | ICD-10-CM | POA: Diagnosis not present

## 2019-11-22 DIAGNOSIS — F329 Major depressive disorder, single episode, unspecified: Secondary | ICD-10-CM | POA: Diagnosis not present

## 2019-11-22 DIAGNOSIS — R634 Abnormal weight loss: Secondary | ICD-10-CM | POA: Diagnosis not present

## 2020-01-03 DIAGNOSIS — G894 Chronic pain syndrome: Secondary | ICD-10-CM | POA: Diagnosis not present

## 2020-01-03 DIAGNOSIS — Z681 Body mass index (BMI) 19 or less, adult: Secondary | ICD-10-CM | POA: Diagnosis not present

## 2020-02-17 DIAGNOSIS — E7849 Other hyperlipidemia: Secondary | ICD-10-CM | POA: Diagnosis not present

## 2020-02-17 DIAGNOSIS — J449 Chronic obstructive pulmonary disease, unspecified: Secondary | ICD-10-CM | POA: Diagnosis not present

## 2020-02-17 DIAGNOSIS — I10 Essential (primary) hypertension: Secondary | ICD-10-CM | POA: Diagnosis not present

## 2020-02-17 DIAGNOSIS — Z87891 Personal history of nicotine dependence: Secondary | ICD-10-CM | POA: Diagnosis not present

## 2020-03-08 DIAGNOSIS — Z681 Body mass index (BMI) 19 or less, adult: Secondary | ICD-10-CM | POA: Diagnosis not present

## 2020-03-08 DIAGNOSIS — G894 Chronic pain syndrome: Secondary | ICD-10-CM | POA: Diagnosis not present

## 2020-04-04 DIAGNOSIS — K219 Gastro-esophageal reflux disease without esophagitis: Secondary | ICD-10-CM | POA: Diagnosis not present

## 2020-04-04 DIAGNOSIS — G894 Chronic pain syndrome: Secondary | ICD-10-CM | POA: Diagnosis not present

## 2020-04-04 DIAGNOSIS — J449 Chronic obstructive pulmonary disease, unspecified: Secondary | ICD-10-CM | POA: Diagnosis not present

## 2020-04-19 DIAGNOSIS — G894 Chronic pain syndrome: Secondary | ICD-10-CM | POA: Diagnosis not present

## 2020-04-19 DIAGNOSIS — J449 Chronic obstructive pulmonary disease, unspecified: Secondary | ICD-10-CM | POA: Diagnosis not present

## 2020-04-19 DIAGNOSIS — Z87891 Personal history of nicotine dependence: Secondary | ICD-10-CM | POA: Diagnosis not present

## 2020-04-19 DIAGNOSIS — F4542 Pain disorder with related psychological factors: Secondary | ICD-10-CM | POA: Diagnosis not present

## 2020-05-07 DIAGNOSIS — R109 Unspecified abdominal pain: Secondary | ICD-10-CM | POA: Diagnosis not present

## 2020-05-07 DIAGNOSIS — K5909 Other constipation: Secondary | ICD-10-CM | POA: Diagnosis not present

## 2020-05-07 DIAGNOSIS — I10 Essential (primary) hypertension: Secondary | ICD-10-CM | POA: Diagnosis not present

## 2020-05-07 DIAGNOSIS — J449 Chronic obstructive pulmonary disease, unspecified: Secondary | ICD-10-CM | POA: Diagnosis not present

## 2020-05-11 ENCOUNTER — Other Ambulatory Visit (HOSPITAL_COMMUNITY): Payer: Self-pay | Admitting: Internal Medicine

## 2020-05-11 ENCOUNTER — Other Ambulatory Visit: Payer: Self-pay | Admitting: Internal Medicine

## 2020-05-11 DIAGNOSIS — R109 Unspecified abdominal pain: Secondary | ICD-10-CM

## 2020-05-19 DIAGNOSIS — Z87891 Personal history of nicotine dependence: Secondary | ICD-10-CM | POA: Diagnosis not present

## 2020-05-19 DIAGNOSIS — J449 Chronic obstructive pulmonary disease, unspecified: Secondary | ICD-10-CM | POA: Diagnosis not present

## 2020-05-19 DIAGNOSIS — F4542 Pain disorder with related psychological factors: Secondary | ICD-10-CM | POA: Diagnosis not present

## 2020-05-19 DIAGNOSIS — G894 Chronic pain syndrome: Secondary | ICD-10-CM | POA: Diagnosis not present

## 2020-05-31 DIAGNOSIS — E440 Moderate protein-calorie malnutrition: Secondary | ICD-10-CM | POA: Diagnosis not present

## 2020-05-31 DIAGNOSIS — J449 Chronic obstructive pulmonary disease, unspecified: Secondary | ICD-10-CM | POA: Diagnosis not present

## 2020-05-31 DIAGNOSIS — G894 Chronic pain syndrome: Secondary | ICD-10-CM | POA: Diagnosis not present

## 2020-06-07 ENCOUNTER — Other Ambulatory Visit: Payer: Self-pay

## 2020-06-07 ENCOUNTER — Ambulatory Visit (HOSPITAL_COMMUNITY)
Admission: RE | Admit: 2020-06-07 | Discharge: 2020-06-07 | Disposition: A | Discharge status: Home / Self Care | Payer: PPO | Source: Ambulatory Visit | Attending: Internal Medicine | Admitting: Internal Medicine

## 2020-06-07 DIAGNOSIS — R109 Unspecified abdominal pain: Secondary | ICD-10-CM | POA: Insufficient documentation

## 2020-06-07 LAB — POCT I-STAT CREATININE: Creatinine, Ser: 0.7 mg/dL (ref 0.44–1.00)

## 2020-06-07 MED ORDER — IOHEXOL 300 MG/ML  SOLN
75.0000 mL | Freq: Once | INTRAMUSCULAR | Status: AC | PRN
  Administered 2020-06-07: 75 mL via INTRAVENOUS

## 2020-06-19 DIAGNOSIS — Z87891 Personal history of nicotine dependence: Secondary | ICD-10-CM | POA: Diagnosis not present

## 2020-06-19 DIAGNOSIS — E7849 Other hyperlipidemia: Secondary | ICD-10-CM | POA: Diagnosis not present

## 2020-06-19 DIAGNOSIS — J449 Chronic obstructive pulmonary disease, unspecified: Secondary | ICD-10-CM | POA: Diagnosis not present

## 2020-06-19 DIAGNOSIS — I10 Essential (primary) hypertension: Secondary | ICD-10-CM | POA: Diagnosis not present

## 2020-06-28 DIAGNOSIS — G894 Chronic pain syndrome: Secondary | ICD-10-CM | POA: Diagnosis not present

## 2020-06-28 DIAGNOSIS — K219 Gastro-esophageal reflux disease without esophagitis: Secondary | ICD-10-CM | POA: Diagnosis not present

## 2020-06-28 DIAGNOSIS — K922 Gastrointestinal hemorrhage, unspecified: Secondary | ICD-10-CM | POA: Diagnosis not present

## 2020-06-28 DIAGNOSIS — I10 Essential (primary) hypertension: Secondary | ICD-10-CM | POA: Diagnosis not present

## 2020-06-28 DIAGNOSIS — R109 Unspecified abdominal pain: Secondary | ICD-10-CM | POA: Diagnosis not present

## 2020-06-28 DIAGNOSIS — Z681 Body mass index (BMI) 19 or less, adult: Secondary | ICD-10-CM | POA: Diagnosis not present

## 2020-07-03 ENCOUNTER — Encounter: Payer: Self-pay | Admitting: Internal Medicine

## 2020-07-20 DIAGNOSIS — F4542 Pain disorder with related psychological factors: Secondary | ICD-10-CM | POA: Diagnosis not present

## 2020-07-20 DIAGNOSIS — Z87891 Personal history of nicotine dependence: Secondary | ICD-10-CM | POA: Diagnosis not present

## 2020-07-20 DIAGNOSIS — G894 Chronic pain syndrome: Secondary | ICD-10-CM | POA: Diagnosis not present

## 2020-07-20 DIAGNOSIS — J449 Chronic obstructive pulmonary disease, unspecified: Secondary | ICD-10-CM | POA: Diagnosis not present

## 2020-07-26 DIAGNOSIS — G894 Chronic pain syndrome: Secondary | ICD-10-CM | POA: Diagnosis not present

## 2020-08-07 DIAGNOSIS — I10 Essential (primary) hypertension: Secondary | ICD-10-CM | POA: Diagnosis not present

## 2020-08-07 DIAGNOSIS — J449 Chronic obstructive pulmonary disease, unspecified: Secondary | ICD-10-CM | POA: Diagnosis not present

## 2020-08-07 DIAGNOSIS — R109 Unspecified abdominal pain: Secondary | ICD-10-CM | POA: Diagnosis not present

## 2020-08-15 ENCOUNTER — Ambulatory Visit: Payer: PPO | Admitting: Gastroenterology

## 2020-08-18 DIAGNOSIS — J449 Chronic obstructive pulmonary disease, unspecified: Secondary | ICD-10-CM | POA: Diagnosis not present

## 2020-08-18 DIAGNOSIS — Z87891 Personal history of nicotine dependence: Secondary | ICD-10-CM | POA: Diagnosis not present

## 2020-08-18 DIAGNOSIS — F4542 Pain disorder with related psychological factors: Secondary | ICD-10-CM | POA: Diagnosis not present

## 2020-08-18 DIAGNOSIS — I10 Essential (primary) hypertension: Secondary | ICD-10-CM | POA: Diagnosis not present

## 2020-08-23 DIAGNOSIS — R109 Unspecified abdominal pain: Secondary | ICD-10-CM | POA: Diagnosis not present

## 2020-08-23 DIAGNOSIS — G894 Chronic pain syndrome: Secondary | ICD-10-CM | POA: Diagnosis not present

## 2020-09-04 NOTE — Progress Notes (Signed)
Referring Provider: Elfredia Nevins, MD Primary Care Physician:  Elfredia Nevins, MD Primary GI: Dr. Jena Gauss    Chief Complaint  Patient presents with  . Abdominal Pain    Has been going on for past couple years. Daily. Lower abd. Had CT done 05/2020  . hemoccult positive    Has seen some blood    HPI:   Elizabeth Fowler is a 70 y.o. 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 Referred for abdominal pain and heme positive stool.   Last colonoscopy in 2018 with pancolonic diverticula, one 2 mm mammillation at recto-sigmoid colon s/p removal, consistent with mucosal prolapse on biopsy.   Weights historically have been in the mid 1teens. Currently weight is 84.8 lbs. Last year in June 2020 weighed 97. Sept 2018 116.    Notes abdominal pain for one year, reporting lower abdomen. Most comfortable sitting on toilet. Can't get comfortable laying down. Has to double over each morning. Going on for a year. Heme positive 3 times.   Has been on oxycodone for 4 months because of abdominal pain. BM every other day. Occasional blood in stool. No melena. Stool is bigger, not smaller. Pain not worsened with eating. Associated nausea with severe pain. Feels like if had female organs, would be falling out. Appetite is not good but forcing self to eat. No dysphagia.   Eats 2 klondike bars a day, ice cream sandwich, energy drink, dinner is meat, veggie, starch, potato chips, cookies, fruit.   Wellness check 2/17 with [place].  She is adamant that she does not want to know if she has cancer. Declining colonoscopy at this point, stating that she was awake and combative during last one (Propofol). By end of visit, she did agree to have findings of any imaging reported to her, even if concerning findings.    Past Medical History:  Diagnosis  Date  . Acid reflux    occ  . Anxiety   . Arthritis    "right hip" (08/31/2013)  . Chronic bronchitis ([facility])    "got it q yr til pneumonia shot given" (08/31/2013)  . Chronic lower back pain   . Complication of anesthesia    pt. states a difficult time waking up-last 2 surgeries  . COPD (chronic obstructive pulmonary disease) ([facility])   . Depression   . Heart murmur    10 or more yrs ago echo neg unable to remember where  . High cholesterol   . Migraines    "goes to bed dark room; last migraine yrs ago hx  . MVP (mitral valve prolapse)    mild anterior leafleat MVP with mild MR 02/26/04 echo  . OCD (obsessive compulsive disorder)   . Pneumonia    "I've had pneumonia probably 30 times" (08/31/2013)  . Shingles 2016  . UTI (lower urinary tract infection) 03/14/2016   starting rx today    Past Surgical History:  Procedure Laterality Date  . ABDOMINAL EXPOSURE N/A 08/31/2013   Procedure: ABDOMINAL EXPOSURE;  Surgeon: Larina Earthly, MD;  [facility];  Service: Vascular;  Laterality: N/A;  . ABDOMINAL HYSTERECTOMY  05/1976  . ANTERIOR CERVICAL DECOMP/DISCECTOMY FUSION N/A 07/06/2014   Procedure: ANTERIOR CERVICAL DECOMPRESSION/DISCECTOMY FUSION 1 LEVEL;  Surgeon: Venita Lick, MD;  [facility];  Service: Orthopedics;  Laterality: N/A;  . ANTERIOR LUMBAR FUSION  N/A 08/31/2013   Procedure: ALIF L5 - S1 1 LEVEL;  Surgeon: Venita Lick, MD;  [facility];  Service: Orthopedics;  Laterality: N/A;  . APPENDECTOMY  1970's  . CARPAL TUNNEL RELEASE Left   . CHOLECYSTECTOMY  1970's  . COLONOSCOPY WITH ESOPHAGOGASTRODUODENOSCOPY (EGD)  04/23/2009   Dr. Jena Gauss: Distal erosive reflux esophagitis with noncritical Schatzki ring status post dilation. Small hiatal hernia. Mottling submucosal petechiae of the gastric mucosa, benign-appearing stellate shaped prepyloric antral ulcer both with benign biopsies and no H. pylori. On colonoscopy she had anal canal hemorrhoids, diminutive hyperplastic  rectal polyp. Diverticulosis. Hepatic flexure polyp lost  . COLONOSCOPY WITH PROPOFOL N/A 04/09/2017   Procedure: COLONOSCOPY WITH PROPOFOL;  Surgeon: Corbin Ade, MD;  [facility];  Service: Endoscopy;  Laterality: N/A;  115  . DILATION AND CURETTAGE OF UTERUS  12/1969  . erethra      ureter not urethra  . KIDNEY SURGERY  05/1976   S/P hysterectomy; cut ureter; had to repair kidney/bladder; thought they removed my kidney for years; records were destroyed; developed problems; told insides looked like mush; cleaned out the infection and repaired leaking bladder I think" (08/31/2013)  . MULTIPLE EXTRACTIONS WITH ALVEOLOPLASTY Bilateral 04/27/2015   Procedure: EXTRACTIONS WITH BILATERAL TORI, MAX (R) TORI;  Surgeon: Ocie Doyne, DDS;  [facility];  Service: Oral Surgery;  Laterality: Bilateral;  . POLYPECTOMY  04/09/2017   Procedure: POLYPECTOMY;  Surgeon: Corbin Ade, MD;  [facility];  Service: Endoscopy;;  colon  . SACROILIAC JOINT FUSION Right 03/19/2016   Procedure: SACROILIAC JOINT FUSION;  Surgeon: Venita Lick, MD;  Location: [facility] OR;  Service: Orthopedics;  Laterality: Right;  . WRIST SURGERY Right 1995    Current Outpatient Medications  Medication Sig Dispense Refill  . albuterol (PROVENTIL HFA;VENTOLIN HFA) 108 (90 BASE) MCG/ACT inhaler Inhale 2 puffs into the lungs every 6 (six) hours as needed for wheezing or shortness of breath.    . busPIRone (BUSPAR) 10 MG tablet Take 10 mg by mouth 2 (two) times daily.    Marland Kitchen EPINEPHrine 0.3 mg/0.3 mL IJ SOAJ injection Inject 0.3 mg into the muscle as needed (allergic reaction).     . gabapentin (NEURONTIN) 300 MG capsule Take 300 mg by mouth 3 (three) times daily.     . mirtazapine (REMERON) 15 MG tablet Take 15 mg by mouth at bedtime.    Marland Kitchen omeprazole (PRILOSEC) 40 MG capsule Take 40 mg by mouth daily.    Marland Kitchen oxycodone (ROXICODONE) 30 MG immediate release tablet Take 30 mg by mouth every 4 (four) hours as needed.     Marland Kitchen tiZANidine (ZANAFLEX) 4 MG tablet Take 4 mg by mouth 3 (three) times daily.     No current facility-administered medications for this visit.    Allergies as of 09/05/2020 - Review Complete 09/05/2020  Allergen Reaction Noted  . Bee venom Anaphylaxis 04/19/2015  . Codeine Anaphylaxis and Swelling 08/26/2014  . Penicillins Anaphylaxis, Swelling, and Other (See Comments) 01/22/2017    Family History  Problem Relation Age of Onset  . Heart disease Mother   . Hypertension Mother   . Colon cancer Neg Hx   . Inflammatory bowel disease Neg Hx     Social History   Socioeconomic History  . Marital status: Married    Spouse name: Not on file  . Number of children: Not on file  . Years of education: Not on file  . Highest education level: Not on file  Occupational History  . Not on file  Tobacco Use  . Smoking status: Former Smoker    Packs/day: 0.25    Years: 50.00    Pack years: 12.50    Types: Cigarettes  . Smokeless tobacco: Never Used  Vaping Use  . Vaping Use: Never used  Substance and Sexual Activity  . Alcohol use: No    Comment: 08/31/2013 "drank a little years ago; last drink was probably in the 1990's"  . Drug use: Not Currently    Types: Marijuana    Comment: in the past  . Sexual activity: Never    Birth control/protection: Surgical, None  Other Topics Concern  . Not on file  Social History Narrative  . Not on file   Social Determinants of Health   Financial Resource Strain: Not on file  Food Insecurity: Not on file  Transportation Needs: Not on file  Physical Activity: Not on file  Stress: Not on file  Social Connections: Not on file    Review of Systems: Gen: see HPI CV: Denies chest pain, palpitations, syncope, peripheral edema, and claudication. Resp: Denies dyspnea at rest, cough, wheezing, coughing up blood, and pleurisy. GI: see HPI Derm: Denies rash, itching, dry skin Psych: Denies depression, anxiety, memory loss, confusion. No  homicidal or suicidal ideation.  Heme: Denies bruising, bleeding, and enlarged lymph nodes.  Physical Exam: BP (!) 144/74   Pulse 75   Temp (!) 96.9 F (36.1 C) (Temporal)   Ht 5\' 3"  (1.6 m)   Wt 84 lb 12.8 oz (38.5 kg)   BMI 15.02 kg/m  General:   Alert and oriented. No distress noted. Pleasant and cooperative.  Head:  Normocephalic and atraumatic. Eyes:  Conjuctiva clear without scleral icterus. Mouth:  Mask in place Cardiac: S1 S2 present without murmurs Lungs: no distress, no wheezing Abdomen:  TTP UPPER ABDOMEN, LUQ/RUQ, +BS, non-distended, no rebound or guarding Msk:  Symmetrical without gross deformities. Normal posture. Extremities:  Without edema. Neurologic:  Alert and  oriented x4 Psych:  Alert and cooperative. Normal mood and affect.  ASSESSMENT: Elizabeth Fowler is a 70 y.o. female presenting today with notable documented weight loss and reported lower abdominal pain waxing and waning, heme positive stool, with last colonoscopy in 2018.   CT on file Nov 2021 with contrast without any acute findings. She has had biliary ductal dilatation and pancreatic duct dilation up to 6 mm chronically but no further imaging such as MRI, which was recommended in the past.   Declining any colonoscopy at this point. She is adamant that she not know if she has any cancer. We discussed pursuing MRI abdomen to start, and she IS agreeable to relaying findings to her regardless of what they may be. Abdominal pain and weight loss quite concerning, and I do note that on exam she has predominant discomfort in upper abdomen (epigastric, RUQ and LUQ) instead of lower abdomen, which is where she describes source of pain. No postprandial component. Unable to rule out chronic mesenteric ischemia although she has no postprandial pain and no food aversions, so I feel this is less likely. Concern for occult malignancy, but she is adamant against colonoscopy. May need to consider EGD, but again, she does not  want to be under anesthesia due to past experiences.   Limited options to provide in evaluation, but she is willing to pursue MRI. I have also requested that she complete CBC, CMP, TSH, when she sees PCP 2/17 and will take those lab orders  with her.    PLAN:  MRI in near future CBC, CMP, TSH Highly recommend TCS +/- EGD if patient is willing Further recommendations to follow   Gelene Mink, PhD, ANP-BC [facility]

## 2020-09-05 ENCOUNTER — Ambulatory Visit (INDEPENDENT_AMBULATORY_CARE_PROVIDER_SITE_OTHER): Payer: PPO | Admitting: Gastroenterology

## 2020-09-05 ENCOUNTER — Encounter: Payer: Self-pay | Admitting: Gastroenterology

## 2020-09-05 ENCOUNTER — Other Ambulatory Visit: Payer: Self-pay

## 2020-09-05 VITALS — BP 144/74 | HR 75 | Temp 96.9°F | Ht 63.0 in | Wt 84.8 lb

## 2020-09-05 DIAGNOSIS — K8689 Other specified diseases of pancreas: Secondary | ICD-10-CM | POA: Diagnosis not present

## 2020-09-05 DIAGNOSIS — R634 Abnormal weight loss: Secondary | ICD-10-CM | POA: Diagnosis not present

## 2020-09-05 DIAGNOSIS — R1030 Lower abdominal pain, unspecified: Secondary | ICD-10-CM | POA: Diagnosis not present

## 2020-09-05 DIAGNOSIS — R109 Unspecified abdominal pain: Secondary | ICD-10-CM | POA: Insufficient documentation

## 2020-09-05 NOTE — Patient Instructions (Signed)
Please have blood work done when you see Dr. Sherwood Gambler.  We are arranging an MRI in near future to further look at liver and pancreas.  Further recommendations to follow once done!   I enjoyed seeing you again today! As you know, I value our relationship and want to provide genuine, compassionate, and quality care. I welcome your feedback. If you receive a survey regarding your visit,  I greatly appreciate you taking time to fill this out. See you next time!  Gelene Mink, PhD, ANP-BC [facility]

## 2020-09-06 ENCOUNTER — Encounter: Payer: Self-pay | Admitting: Gastroenterology

## 2020-09-07 ENCOUNTER — Ambulatory Visit (HOSPITAL_COMMUNITY): Payer: PPO

## 2020-09-10 NOTE — Progress Notes (Signed)
Cc'ed to pcp °

## 2020-09-19 ENCOUNTER — Ambulatory Visit (HOSPITAL_COMMUNITY)
Admission: RE | Admit: 2020-09-19 | Discharge: 2020-09-19 | Disposition: A | Discharge status: Home / Self Care | Payer: PPO | Source: Ambulatory Visit | Attending: Gastroenterology | Admitting: Gastroenterology

## 2020-09-19 ENCOUNTER — Other Ambulatory Visit: Payer: Self-pay

## 2020-09-19 ENCOUNTER — Other Ambulatory Visit: Payer: Self-pay | Admitting: Gastroenterology

## 2020-09-19 DIAGNOSIS — K8689 Other specified diseases of pancreas: Secondary | ICD-10-CM | POA: Insufficient documentation

## 2020-09-19 DIAGNOSIS — R634 Abnormal weight loss: Secondary | ICD-10-CM | POA: Diagnosis not present

## 2020-09-19 DIAGNOSIS — G8929 Other chronic pain: Secondary | ICD-10-CM | POA: Diagnosis not present

## 2020-09-19 MED ORDER — GADOBUTROL 1 MMOL/ML IV SOLN
4.0000 mL | Freq: Once | INTRAVENOUS | Status: AC | PRN
  Administered 2020-09-19: 4 mL via INTRAVENOUS

## 2020-09-20 ENCOUNTER — Telehealth: Payer: Self-pay | Admitting: Internal Medicine

## 2020-09-20 NOTE — Telephone Encounter (Signed)
Spoke with pt. We will call her when the provider reviews her results.

## 2020-09-20 NOTE — Telephone Encounter (Signed)
Pt had MRI yesterday, calling for results today. I told her it normally takes a few days before the doctor reviews and gets back to the nurse, but I would let nurse know that she had called. [phone]

## 2020-10-01 ENCOUNTER — Other Ambulatory Visit: Payer: Self-pay | Admitting: Gastroenterology

## 2020-10-01 DIAGNOSIS — R634 Abnormal weight loss: Secondary | ICD-10-CM | POA: Diagnosis not present

## 2020-10-01 DIAGNOSIS — R1030 Lower abdominal pain, unspecified: Secondary | ICD-10-CM | POA: Diagnosis not present

## 2020-10-01 DIAGNOSIS — G894 Chronic pain syndrome: Secondary | ICD-10-CM | POA: Diagnosis not present

## 2020-10-01 DIAGNOSIS — Z0001 Encounter for general adult medical examination with abnormal findings: Secondary | ICD-10-CM | POA: Diagnosis not present

## 2020-10-01 DIAGNOSIS — Z1331 Encounter for screening for depression: Secondary | ICD-10-CM | POA: Diagnosis not present

## 2020-10-01 DIAGNOSIS — Z681 Body mass index (BMI) 19 or less, adult: Secondary | ICD-10-CM | POA: Diagnosis not present

## 2020-10-01 DIAGNOSIS — I10 Essential (primary) hypertension: Secondary | ICD-10-CM | POA: Diagnosis not present

## 2020-10-01 DIAGNOSIS — E7849 Other hyperlipidemia: Secondary | ICD-10-CM | POA: Diagnosis not present

## 2020-10-01 DIAGNOSIS — E441 Mild protein-calorie malnutrition: Secondary | ICD-10-CM | POA: Diagnosis not present

## 2020-10-02 LAB — CBC WITH DIFFERENTIAL/PLATELET
Basophils Absolute: 0.1 10*3/uL (ref 0.0–0.2)
Basos: 1 %
EOS (ABSOLUTE): 0.2 10*3/uL (ref 0.0–0.4)
Eos: 2 %
Hematocrit: 50.1 % — ABNORMAL HIGH (ref 34.0–46.6)
Hemoglobin: 16.6 g/dL — ABNORMAL HIGH (ref 11.1–15.9)
Immature Grans (Abs): 0 10*3/uL (ref 0.0–0.1)
Immature Granulocytes: 0 %
Lymphocytes Absolute: 3.2 10*3/uL — ABNORMAL HIGH (ref 0.7–3.1)
Lymphs: 35 %
MCH: 30.1 pg (ref 26.6–33.0)
MCHC: 33.1 g/dL (ref 31.5–35.7)
MCV: 91 fL (ref 79–97)
Monocytes Absolute: 0.6 10*3/uL (ref 0.1–0.9)
Monocytes: 6 %
Neutrophils Absolute: 5.3 10*3/uL (ref 1.4–7.0)
Neutrophils: 56 %
Platelets: 212 10*3/uL (ref 150–450)
RBC: 5.52 x10E6/uL — ABNORMAL HIGH (ref 3.77–5.28)
RDW: 13.1 % (ref 11.7–15.4)
WBC: 9.4 10*3/uL (ref 3.4–10.8)

## 2020-10-02 LAB — COMPREHENSIVE METABOLIC PANEL
ALT: 13 IU/L (ref 0–32)
AST: 19 IU/L (ref 0–40)
Albumin/Globulin Ratio: 2.1 (ref 1.2–2.2)
Albumin: 4.7 g/dL (ref 3.8–4.8)
Alkaline Phosphatase: 95 IU/L (ref 44–121)
BUN/Creatinine Ratio: 20 (ref 12–28)
BUN: 14 mg/dL (ref 8–27)
Bilirubin Total: 0.5 mg/dL (ref 0.0–1.2)
CO2: 26 mmol/L (ref 20–29)
Calcium: 9.5 mg/dL (ref 8.7–10.3)
Chloride: 93 mmol/L — ABNORMAL LOW (ref 96–106)
Creatinine, Ser: 0.69 mg/dL (ref 0.57–1.00)
Globulin, Total: 2.2 g/dL (ref 1.5–4.5)
Glucose: 149 mg/dL — ABNORMAL HIGH (ref 65–99)
Potassium: 4.1 mmol/L (ref 3.5–5.2)
Sodium: 139 mmol/L (ref 134–144)
Total Protein: 6.9 g/dL (ref 6.0–8.5)
eGFR: 94 mL/min/{1.73_m2} (ref >59)

## 2020-10-02 LAB — SPECIMEN STATUS REPORT

## 2020-10-02 LAB — TSH: TSH: 1.21 u[IU]/mL (ref 0.450–4.500)

## 2020-10-02 NOTE — Patient Instructions (Signed)
CBC, CMP, TSH results placed in [employer].

## 2020-10-05 NOTE — Progress Notes (Signed)
Received labs dated 10/01/20.   Hgb 16.6, Hct 50.1 (both elevated). BUN 14, Creatinine 0.69, Tbili 0.5, Alk Phos 95, AST 19, ALT 13. TSH 1.210.   Labs unrevealing except for elevated Hgb/Hct. Unclear if this is chronic or not.  Still recommend EGD/colonoscopy. Is she willing?

## 2020-10-08 ENCOUNTER — Telehealth: Payer: Self-pay | Admitting: Gastroenterology

## 2020-10-08 NOTE — Progress Notes (Signed)
Lm with pts spouse. Pt will call back when she wakes up.

## 2020-10-08 NOTE — Progress Notes (Signed)
Spoke with pt. Pt was notified of lab results and recommendations of an EGD. Pt is going to talk things over with her husband and give our office a call back.

## 2020-10-08 NOTE — Telephone Encounter (Signed)
Please send to nursing staff. I didn't call patient. Thanks!

## 2020-10-08 NOTE — Telephone Encounter (Signed)
Routing to [place] who tried to call recently.

## 2020-10-08 NOTE — Telephone Encounter (Signed)
[phone] patient called and stated she was returning call from you.

## 2020-10-09 NOTE — Telephone Encounter (Signed)
See imaging note for documentation.

## 2020-10-17 DIAGNOSIS — I10 Essential (primary) hypertension: Secondary | ICD-10-CM | POA: Diagnosis not present

## 2020-10-17 DIAGNOSIS — E7849 Other hyperlipidemia: Secondary | ICD-10-CM | POA: Diagnosis not present

## 2020-10-17 DIAGNOSIS — J449 Chronic obstructive pulmonary disease, unspecified: Secondary | ICD-10-CM | POA: Diagnosis not present

## 2020-10-17 DIAGNOSIS — Z87891 Personal history of nicotine dependence: Secondary | ICD-10-CM | POA: Diagnosis not present

## 2020-10-31 DIAGNOSIS — N390 Urinary tract infection, site not specified: Secondary | ICD-10-CM | POA: Diagnosis not present

## 2020-10-31 DIAGNOSIS — I10 Essential (primary) hypertension: Secondary | ICD-10-CM | POA: Diagnosis not present

## 2020-10-31 DIAGNOSIS — G894 Chronic pain syndrome: Secondary | ICD-10-CM | POA: Diagnosis not present

## 2020-11-15 DIAGNOSIS — N390 Urinary tract infection, site not specified: Secondary | ICD-10-CM | POA: Diagnosis not present

## 2020-11-17 DIAGNOSIS — E7849 Other hyperlipidemia: Secondary | ICD-10-CM | POA: Diagnosis not present

## 2020-11-17 DIAGNOSIS — I10 Essential (primary) hypertension: Secondary | ICD-10-CM | POA: Diagnosis not present

## 2020-11-17 DIAGNOSIS — J449 Chronic obstructive pulmonary disease, unspecified: Secondary | ICD-10-CM | POA: Diagnosis not present

## 2020-11-17 DIAGNOSIS — Z87891 Personal history of nicotine dependence: Secondary | ICD-10-CM | POA: Diagnosis not present

## 2020-11-27 DIAGNOSIS — G894 Chronic pain syndrome: Secondary | ICD-10-CM | POA: Diagnosis not present

## 2020-11-27 DIAGNOSIS — Z681 Body mass index (BMI) 19 or less, adult: Secondary | ICD-10-CM | POA: Diagnosis not present

## 2020-11-27 DIAGNOSIS — R102 Pelvic and perineal pain: Secondary | ICD-10-CM | POA: Diagnosis not present

## 2020-12-18 DIAGNOSIS — E7849 Other hyperlipidemia: Secondary | ICD-10-CM | POA: Diagnosis not present

## 2020-12-18 DIAGNOSIS — I10 Essential (primary) hypertension: Secondary | ICD-10-CM | POA: Diagnosis not present

## 2020-12-18 DIAGNOSIS — J449 Chronic obstructive pulmonary disease, unspecified: Secondary | ICD-10-CM | POA: Diagnosis not present

## 2020-12-25 DIAGNOSIS — G894 Chronic pain syndrome: Secondary | ICD-10-CM | POA: Diagnosis not present

## 2020-12-25 DIAGNOSIS — I10 Essential (primary) hypertension: Secondary | ICD-10-CM | POA: Diagnosis not present

## 2020-12-25 DIAGNOSIS — F419 Anxiety disorder, unspecified: Secondary | ICD-10-CM | POA: Diagnosis not present

## 2021-01-17 DIAGNOSIS — J449 Chronic obstructive pulmonary disease, unspecified: Secondary | ICD-10-CM | POA: Diagnosis not present

## 2021-01-17 DIAGNOSIS — I10 Essential (primary) hypertension: Secondary | ICD-10-CM | POA: Diagnosis not present

## 2021-01-17 DIAGNOSIS — E7849 Other hyperlipidemia: Secondary | ICD-10-CM | POA: Diagnosis not present

## 2021-01-24 ENCOUNTER — Ambulatory Visit: Payer: PPO | Admitting: Urology

## 2021-01-24 DIAGNOSIS — E440 Moderate protein-calorie malnutrition: Secondary | ICD-10-CM | POA: Diagnosis not present

## 2021-01-24 DIAGNOSIS — J449 Chronic obstructive pulmonary disease, unspecified: Secondary | ICD-10-CM | POA: Diagnosis not present

## 2021-01-24 DIAGNOSIS — G894 Chronic pain syndrome: Secondary | ICD-10-CM | POA: Diagnosis not present

## 2021-02-26 DIAGNOSIS — F17290 Nicotine dependence, other tobacco product, uncomplicated: Secondary | ICD-10-CM | POA: Diagnosis not present

## 2021-02-26 DIAGNOSIS — Z681 Body mass index (BMI) 19 or less, adult: Secondary | ICD-10-CM | POA: Diagnosis not present

## 2021-02-26 DIAGNOSIS — G894 Chronic pain syndrome: Secondary | ICD-10-CM | POA: Diagnosis not present

## 2021-02-26 DIAGNOSIS — K219 Gastro-esophageal reflux disease without esophagitis: Secondary | ICD-10-CM | POA: Diagnosis not present

## 2021-02-26 DIAGNOSIS — E440 Moderate protein-calorie malnutrition: Secondary | ICD-10-CM | POA: Diagnosis not present

## 2021-02-26 DIAGNOSIS — Z719 Counseling, unspecified: Secondary | ICD-10-CM | POA: Diagnosis not present

## 2021-03-28 DIAGNOSIS — N3000 Acute cystitis without hematuria: Secondary | ICD-10-CM | POA: Diagnosis not present

## 2021-03-28 DIAGNOSIS — J449 Chronic obstructive pulmonary disease, unspecified: Secondary | ICD-10-CM | POA: Diagnosis not present

## 2021-04-22 DIAGNOSIS — G4700 Insomnia, unspecified: Secondary | ICD-10-CM | POA: Diagnosis not present

## 2021-04-22 DIAGNOSIS — G894 Chronic pain syndrome: Secondary | ICD-10-CM | POA: Diagnosis not present

## 2021-05-24 DIAGNOSIS — J449 Chronic obstructive pulmonary disease, unspecified: Secondary | ICD-10-CM | POA: Diagnosis not present

## 2021-05-24 DIAGNOSIS — G894 Chronic pain syndrome: Secondary | ICD-10-CM | POA: Diagnosis not present

## 2021-05-24 DIAGNOSIS — I10 Essential (primary) hypertension: Secondary | ICD-10-CM | POA: Diagnosis not present

## 2021-05-24 DIAGNOSIS — Z681 Body mass index (BMI) 19 or less, adult: Secondary | ICD-10-CM | POA: Diagnosis not present

## 2021-06-17 DIAGNOSIS — I10 Essential (primary) hypertension: Secondary | ICD-10-CM | POA: Diagnosis not present

## 2021-06-17 DIAGNOSIS — G894 Chronic pain syndrome: Secondary | ICD-10-CM | POA: Diagnosis not present

## 2021-06-17 DIAGNOSIS — J449 Chronic obstructive pulmonary disease, unspecified: Secondary | ICD-10-CM | POA: Diagnosis not present

## 2021-07-24 DIAGNOSIS — J449 Chronic obstructive pulmonary disease, unspecified: Secondary | ICD-10-CM | POA: Diagnosis not present

## 2021-07-24 DIAGNOSIS — K219 Gastro-esophageal reflux disease without esophagitis: Secondary | ICD-10-CM | POA: Diagnosis not present

## 2021-07-24 DIAGNOSIS — G894 Chronic pain syndrome: Secondary | ICD-10-CM | POA: Diagnosis not present

## 2021-08-20 DIAGNOSIS — J449 Chronic obstructive pulmonary disease, unspecified: Secondary | ICD-10-CM | POA: Diagnosis not present

## 2021-08-20 DIAGNOSIS — I10 Essential (primary) hypertension: Secondary | ICD-10-CM | POA: Diagnosis not present

## 2021-08-20 DIAGNOSIS — Z681 Body mass index (BMI) 19 or less, adult: Secondary | ICD-10-CM | POA: Diagnosis not present

## 2021-08-20 DIAGNOSIS — E7849 Other hyperlipidemia: Secondary | ICD-10-CM | POA: Diagnosis not present

## 2021-08-20 DIAGNOSIS — G894 Chronic pain syndrome: Secondary | ICD-10-CM | POA: Diagnosis not present

## 2021-09-13 DIAGNOSIS — I10 Essential (primary) hypertension: Secondary | ICD-10-CM | POA: Diagnosis not present

## 2021-09-13 DIAGNOSIS — F419 Anxiety disorder, unspecified: Secondary | ICD-10-CM | POA: Diagnosis not present

## 2021-09-13 DIAGNOSIS — J449 Chronic obstructive pulmonary disease, unspecified: Secondary | ICD-10-CM | POA: Diagnosis not present

## 2021-09-13 DIAGNOSIS — G894 Chronic pain syndrome: Secondary | ICD-10-CM | POA: Diagnosis not present

## 2021-10-15 DIAGNOSIS — G894 Chronic pain syndrome: Secondary | ICD-10-CM | POA: Diagnosis not present

## 2021-10-15 DIAGNOSIS — I10 Essential (primary) hypertension: Secondary | ICD-10-CM | POA: Diagnosis not present

## 2021-10-15 DIAGNOSIS — K219 Gastro-esophageal reflux disease without esophagitis: Secondary | ICD-10-CM | POA: Diagnosis not present

## 2021-11-15 DIAGNOSIS — Z23 Encounter for immunization: Secondary | ICD-10-CM | POA: Diagnosis not present

## 2021-11-15 DIAGNOSIS — Z0001 Encounter for general adult medical examination with abnormal findings: Secondary | ICD-10-CM | POA: Diagnosis not present

## 2021-11-15 DIAGNOSIS — K219 Gastro-esophageal reflux disease without esophagitis: Secondary | ICD-10-CM | POA: Diagnosis not present

## 2021-11-15 DIAGNOSIS — I10 Essential (primary) hypertension: Secondary | ICD-10-CM | POA: Diagnosis not present

## 2021-11-15 DIAGNOSIS — J441 Chronic obstructive pulmonary disease with (acute) exacerbation: Secondary | ICD-10-CM | POA: Diagnosis not present

## 2021-11-15 DIAGNOSIS — Z1331 Encounter for screening for depression: Secondary | ICD-10-CM | POA: Diagnosis not present

## 2021-11-15 DIAGNOSIS — G894 Chronic pain syndrome: Secondary | ICD-10-CM | POA: Diagnosis not present

## 2021-11-15 DIAGNOSIS — Z9229 Personal history of other drug therapy: Secondary | ICD-10-CM | POA: Diagnosis not present

## 2021-11-15 DIAGNOSIS — Z681 Body mass index (BMI) 19 or less, adult: Secondary | ICD-10-CM | POA: Diagnosis not present

## 2021-11-29 DIAGNOSIS — G894 Chronic pain syndrome: Secondary | ICD-10-CM | POA: Diagnosis not present

## 2021-11-29 DIAGNOSIS — J449 Chronic obstructive pulmonary disease, unspecified: Secondary | ICD-10-CM | POA: Diagnosis not present

## 2021-11-29 DIAGNOSIS — M159 Polyosteoarthritis, unspecified: Secondary | ICD-10-CM | POA: Diagnosis not present

## 2021-11-29 DIAGNOSIS — I10 Essential (primary) hypertension: Secondary | ICD-10-CM | POA: Diagnosis not present

## 2022-01-03 DIAGNOSIS — J449 Chronic obstructive pulmonary disease, unspecified: Secondary | ICD-10-CM | POA: Diagnosis not present

## 2022-01-03 DIAGNOSIS — M159 Polyosteoarthritis, unspecified: Secondary | ICD-10-CM | POA: Diagnosis not present

## 2022-01-03 DIAGNOSIS — G894 Chronic pain syndrome: Secondary | ICD-10-CM | POA: Diagnosis not present

## 2022-01-22 ENCOUNTER — Encounter (HOSPITAL_COMMUNITY): Payer: Self-pay

## 2022-01-22 ENCOUNTER — Other Ambulatory Visit: Payer: Self-pay

## 2022-01-22 ENCOUNTER — Emergency Department (HOSPITAL_COMMUNITY)
Admission: EM | Admit: 2022-01-22 | Discharge: 2022-01-22 | Disposition: A | Discharge status: Home / Self Care | Payer: PPO | Attending: Emergency Medicine | Admitting: Emergency Medicine

## 2022-01-22 DIAGNOSIS — G8929 Other chronic pain: Secondary | ICD-10-CM | POA: Insufficient documentation

## 2022-01-22 DIAGNOSIS — R0602 Shortness of breath: Secondary | ICD-10-CM | POA: Diagnosis not present

## 2022-01-22 DIAGNOSIS — R0902 Hypoxemia: Secondary | ICD-10-CM | POA: Diagnosis not present

## 2022-01-22 DIAGNOSIS — R1084 Generalized abdominal pain: Secondary | ICD-10-CM

## 2022-01-22 DIAGNOSIS — R0689 Other abnormalities of breathing: Secondary | ICD-10-CM | POA: Diagnosis not present

## 2022-01-22 DIAGNOSIS — J189 Pneumonia, unspecified organism: Secondary | ICD-10-CM | POA: Diagnosis not present

## 2022-01-22 DIAGNOSIS — I10 Essential (primary) hypertension: Secondary | ICD-10-CM | POA: Diagnosis not present

## 2022-01-22 LAB — CBC WITH DIFFERENTIAL/PLATELET
Abs Immature Granulocytes: 0.03 10*3/uL (ref 0.00–0.07)
Basophils Absolute: 0 10*3/uL (ref 0.0–0.1)
Basophils Relative: 0 %
Eosinophils Absolute: 0 10*3/uL (ref 0.0–0.5)
Eosinophils Relative: 1 %
HCT: 48 % — ABNORMAL HIGH (ref 36.0–46.0)
Hemoglobin: 16.4 g/dL — ABNORMAL HIGH (ref 12.0–15.0)
Immature Granulocytes: 0 %
Lymphocytes Relative: 14 %
Lymphs Abs: 1.1 10*3/uL (ref 0.7–4.0)
MCH: 30.9 pg (ref 26.0–34.0)
MCHC: 34.2 g/dL (ref 30.0–36.0)
MCV: 90.6 fL (ref 80.0–100.0)
Monocytes Absolute: 0.6 10*3/uL (ref 0.1–1.0)
Monocytes Relative: 7 %
Neutro Abs: 6.3 10*3/uL (ref 1.7–7.7)
Neutrophils Relative %: 78 %
Platelets: 210 10*3/uL (ref 150–400)
RBC: 5.3 MIL/uL — ABNORMAL HIGH (ref 3.87–5.11)
RDW: 13.4 % (ref 11.5–15.5)
WBC: 8.1 10*3/uL (ref 4.0–10.5)
nRBC: 0 % (ref 0.0–0.2)

## 2022-01-22 LAB — URINALYSIS, ROUTINE W REFLEX MICROSCOPIC
Bilirubin Urine: NEGATIVE
Glucose, UA: NEGATIVE mg/dL
Hgb urine dipstick: NEGATIVE
Ketones, ur: 5 mg/dL — AB
Leukocytes,Ua: NEGATIVE
Nitrite: NEGATIVE
Protein, ur: 30 mg/dL — AB
Specific Gravity, Urine: 1.014 (ref 1.005–1.030)
pH: 8 (ref 5.0–8.0)

## 2022-01-22 LAB — COMPREHENSIVE METABOLIC PANEL
ALT: 21 U/L (ref 0–44)
AST: 23 U/L (ref 15–41)
Albumin: 4.4 g/dL (ref 3.5–5.0)
Alkaline Phosphatase: 71 U/L (ref 38–126)
Anion gap: 11 (ref 5–15)
BUN: 12 mg/dL (ref 8–23)
CO2: 26 mmol/L (ref 22–32)
Calcium: 9.2 mg/dL (ref 8.9–10.3)
Chloride: 99 mmol/L (ref 98–111)
Creatinine, Ser: 0.46 mg/dL (ref 0.44–1.00)
GFR, Estimated: >60 mL/min (ref >60)
Glucose, Bld: 132 mg/dL — ABNORMAL HIGH (ref 70–99)
Potassium: 3.4 mmol/L — ABNORMAL LOW (ref 3.5–5.1)
Sodium: 136 mmol/L (ref 135–145)
Total Bilirubin: 1 mg/dL (ref 0.3–1.2)
Total Protein: 7.2 g/dL (ref 6.5–8.1)

## 2022-01-22 LAB — LIPASE, BLOOD: Lipase: 21 U/L (ref 11–51)

## 2022-01-22 MED ORDER — ONDANSETRON HCL 4 MG/2ML IJ SOLN
4.0000 mg | Freq: Once | INTRAMUSCULAR | Status: AC
  Administered 2022-01-22: 4 mg via INTRAVENOUS
  Filled 2022-01-22: qty 2

## 2022-01-22 MED ORDER — SODIUM CHLORIDE 0.9 % IV BOLUS
1000.0000 mL | Freq: Once | INTRAVENOUS | Status: AC
  Administered 2022-01-22: 1000 mL via INTRAVENOUS

## 2022-01-22 MED ORDER — HYDROMORPHONE HCL 1 MG/ML IJ SOLN
1.0000 mg | Freq: Once | INTRAMUSCULAR | Status: AC
  Administered 2022-01-22: 1 mg via INTRAVENOUS
  Filled 2022-01-22: qty 1

## 2022-01-22 NOTE — ED Triage Notes (Signed)
Pt BIB [facility], called out for abd pain, n/v x 3. Endorses SHOB, pneumonia 2 years, states today pain got worse, increased difficulty breathing and pain with deep breaths.  Pt states she has been on multiple abx for the last 2 years.

## 2022-01-22 NOTE — ED Notes (Signed)
Unable to obtain accurate BP due to pt removing cuff and c/o pain 178/67 HR 82 98 % 2L per EMS

## 2022-01-22 NOTE — ED Notes (Signed)
Pt ambulated to the restroom x 1 assist, no difficulty walking, RN stood by and walked with pt for safety.

## 2022-01-22 NOTE — ED Provider Notes (Signed)
[facility] EMERGENCY DEPARTMENT Provider Note   CSN: [id number] Arrival date & time: 01/22/22  1213     History  Chief Complaint  Patient presents with   Shortness of Breath    Elizabeth Fowler is a 71 y.o. female.  Patient complains of chronic abdominal pain.  No vomiting no diarrhea she has been seen several times for this.  She has been followed by GI  The history is provided by the patient.  Abdominal Pain Pain location:  Generalized Pain quality: aching   Pain radiates to:  Does not radiate Pain severity:  Moderate Onset quality:  Gradual Timing:  Constant Progression:  Waxing and waning Chronicity:  Recurrent Relieved by:  Nothing Worsened by:  Nothing Ineffective treatments:  None tried Associated symptoms: no chest pain, no cough, no diarrhea, no fatigue and no hematuria        Home Medications Prior to Admission medications   Medication Sig Start Date End Date Taking? Authorizing Provider  albuterol (PROVENTIL HFA;VENTOLIN HFA) 108 (90 BASE) MCG/ACT inhaler Inhale 2 puffs into the lungs every 6 (six) hours as needed for wheezing or shortness of breath.   Yes [provider]  BELSOMRA 10 MG TABS Take 1 tablet by mouth daily. 01/03/22  Yes [provider]  busPIRone (BUSPAR) 10 MG tablet Take 10 mg by mouth 2 (two) times daily. 08/07/20  Yes [provider]  EPINEPHrine 0.3 mg/0.3 mL IJ SOAJ injection Inject 0.3 mg into the muscle as needed (allergic reaction).    Yes [provider]  gabapentin (NEURONTIN) 300 MG capsule Take 300 mg by mouth 3 (three) times daily.    Yes [provider]  mirtazapine (REMERON) 15 MG tablet Take 15 mg by mouth at bedtime. 08/31/20  Yes [provider]  omeprazole (PRILOSEC) 40 MG capsule Take 40 mg by mouth daily. 03/16/17  Yes [provider]  oxycodone (ROXICODONE) 30 MG immediate release tablet Take 30 mg by mouth every 4 (four) hours as needed. 08/08/20  Yes [provider]  tiZANidine (ZANAFLEX) 4 MG tablet Take 4 mg by mouth 3 (three) times daily. 03/05/17  Yes [provider]      Allergies    Bee venom, Codeine, and Penicillins    Review of Systems   Review of Systems  Constitutional:  Negative for appetite change and fatigue.  HENT:  Negative for congestion, ear discharge and sinus pressure.   Eyes:  Negative for discharge.  Respiratory:  Negative for cough.   Cardiovascular:  Negative for chest pain.  Gastrointestinal:  Positive for abdominal pain. Negative for diarrhea.  Genitourinary:  Negative for frequency and hematuria.  Musculoskeletal:  Negative for back pain.  Skin:  Negative for rash.  Neurological:  Negative for seizures and headaches.  Psychiatric/Behavioral:  Negative for hallucinations.     Physical Exam Updated Vital Signs BP (!) 158/70   Pulse 71   Temp 97.9 F (36.6 C) (Oral)   Resp 13   Ht 5\' 1"  (1.549 m)   Wt 44 kg   SpO2 92%   BMI 18.33 kg/m  Physical Exam Vitals and nursing note reviewed.  Constitutional:      Appearance: She is well-developed.  HENT:     Head: Normocephalic.     Mouth/Throat:     Mouth: Mucous membranes are moist.  Eyes:     General: No scleral icterus.    Conjunctiva/sclera: Conjunctivae normal.  Neck:     Thyroid: No thyromegaly.  Cardiovascular:  Rate and Rhythm: Normal rate and regular rhythm.     Heart sounds: No murmur heard.    No friction rub. No gallop.  Pulmonary:     Breath sounds: No stridor. No wheezing or rales.  Chest:     Chest wall: No tenderness.  Abdominal:     General: There is no distension.     Tenderness: There is abdominal tenderness. There is no rebound.  Musculoskeletal:        General: Normal range of motion.     Cervical back: Neck supple.  Lymphadenopathy:     Cervical: No cervical adenopathy.  Skin:    Findings: No erythema or rash.  Neurological:     Mental Status: She is alert and oriented to person, place, and time.      Motor: No abnormal muscle tone.     Coordination: Coordination normal.  Psychiatric:        Behavior: Behavior normal.     ED Results / Procedures / Treatments   Labs (all labs ordered are listed, but only abnormal results are displayed) Labs Reviewed  CBC WITH DIFFERENTIAL/PLATELET - Abnormal; Notable for the following components:      Result Value   RBC 5.30 (*)    Hemoglobin 16.4 (*)    HCT 48.0 (*)    All other components within normal limits  COMPREHENSIVE METABOLIC PANEL - Abnormal; Notable for the following components:   Potassium 3.4 (*)    Glucose, Bld 132 (*)    All other components within normal limits  URINALYSIS, ROUTINE W REFLEX MICROSCOPIC - Abnormal; Notable for the following components:   APPearance HAZY (*)    Ketones, ur 5 (*)    Protein, ur 30 (*)    Bacteria, UA RARE (*)    All other components within normal limits  LIPASE, BLOOD    EKG None  Radiology No results found.  Procedures Procedures    Medications Ordered in ED Medications  sodium chloride 0.9 % bolus 1,000 mL (0 mLs Intravenous Stopped 01/22/22 1404)  HYDROmorphone (DILAUDID) injection 1 mg (1 mg Intravenous Given 01/22/22 1334)  ondansetron (ZOFRAN) injection 4 mg (4 mg Intravenous Given 01/22/22 1333)    ED Course/ Medical Decision Making/ A&P                           Medical Decision Making Amount and/or Complexity of Data Reviewed Labs: ordered.  Risk Prescription drug management.  \  This patient presents to the ED for concern of abdominal pain, this involves an extensive number of treatment options, and is a complaint that carries with it a high risk of complications and morbidity.  The differential diagnosis includes adhesions, diverticulitis   Co morbidities that complicate the patient evaluation  Chronic abdominal pain, COPD   Additional history obtained:  Additional history obtained from friend External records from outside source obtained and reviewed  including hospital record   Lab Tests:  I Ordered, and personally interpreted labs.  The pertinent results include: CBC chemistries urinalysis all negative   Imaging Studies ordered:  No imaging  Cardiac Monitoring: / EKG:  The patient was maintained on a cardiac monitor.  I personally viewed and interpreted the cardiac monitored which showed an underlying rhythm of: Normal sinus rhythm   Consultations Obtained:  No consultant Problem List / ED Course / Critical interventions / Medication management  [place] pain and COPD I ordered medication including Dilaudid for pain Reevaluation of the  patient after these medicines showed that the patient improved I have reviewed the patients home medicines and have made adjustments as needed   Social Determinants of Health:  None   Test / Admission - Considered:  CT scan abdomen was considered but patient refused   Patient with chronic abdominal pain.  Labs unremarkable.  Patient refused imaging.  She was given some pain medicine and will follow-up with GI        Final Clinical Impression(s) / ED Diagnoses Final diagnoses:  Generalized abdominal pain    Rx / DC Orders ED Discharge Orders     None         Bethann Berkshire, MD 01/22/22 1819

## 2022-01-22 NOTE — Discharge Instructions (Addendum)
Follow-up with Dr. Marletta Lor or one of his associates at the gastroenterologist office

## 2022-01-22 NOTE — ED Notes (Signed)
Paged Cardiology 13:45

## 2022-02-13 ENCOUNTER — Other Ambulatory Visit (HOSPITAL_COMMUNITY): Payer: Self-pay | Admitting: Internal Medicine

## 2022-02-13 ENCOUNTER — Other Ambulatory Visit: Payer: Self-pay | Admitting: Internal Medicine

## 2022-02-13 DIAGNOSIS — R1084 Generalized abdominal pain: Secondary | ICD-10-CM | POA: Diagnosis not present

## 2022-02-13 DIAGNOSIS — Z681 Body mass index (BMI) 19 or less, adult: Secondary | ICD-10-CM | POA: Diagnosis not present

## 2022-02-13 DIAGNOSIS — I700 Atherosclerosis of aorta: Secondary | ICD-10-CM | POA: Diagnosis not present

## 2022-02-13 DIAGNOSIS — M159 Polyosteoarthritis, unspecified: Secondary | ICD-10-CM | POA: Diagnosis not present

## 2022-02-13 DIAGNOSIS — I10 Essential (primary) hypertension: Secondary | ICD-10-CM | POA: Diagnosis not present

## 2022-02-13 DIAGNOSIS — J449 Chronic obstructive pulmonary disease, unspecified: Secondary | ICD-10-CM | POA: Diagnosis not present

## 2022-02-13 DIAGNOSIS — G894 Chronic pain syndrome: Secondary | ICD-10-CM | POA: Diagnosis not present

## 2022-02-21 ENCOUNTER — Ambulatory Visit (HOSPITAL_COMMUNITY): Payer: PPO

## 2022-02-26 ENCOUNTER — Encounter (HOSPITAL_COMMUNITY): Payer: Self-pay

## 2022-02-26 ENCOUNTER — Ambulatory Visit (HOSPITAL_COMMUNITY): Admission: RE | Admit: 2022-02-26 | Payer: PPO | Source: Ambulatory Visit

## 2022-03-04 IMAGING — CT CT ABD-PELV W/ CM
2 of 5 series · 16 of 46 positions shown, 18 images · IV contrast (Omnipaque or Isovue)
Comparison: 01/22/2017

CLINICAL DATA: Lower abdominal pain, nausea, vomiting, diarrhea for
1 month

EXAM:
CT ABDOMEN AND PELVIS WITH CONTRAST
TECHNIQUE: Multidetector CT imaging of the abdomen and pelvis was performed
using the standard protocol following bolus administration of
intravenous contrast.
CONTRAST:  75mL OMNIPAQUE IOHEXOL 300 MG/ML SOLN, additional oral
enteric contrast

[Series 2: axial st · axial · 0.80mm/px · z∈[+267,+622]mm · 13 of 81 slices shown, 15 images]
[im 5/81  soft-tissue]
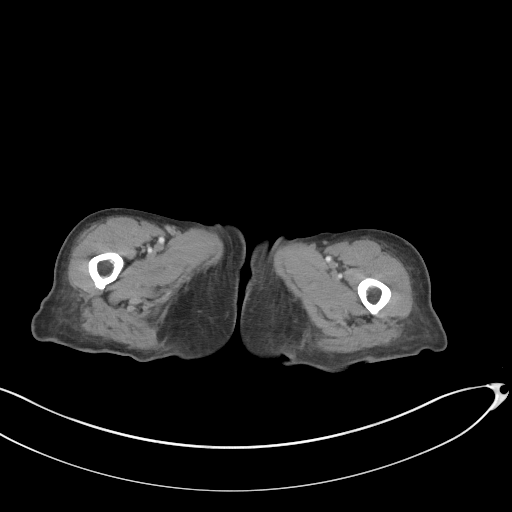
[im 5/81  bone]
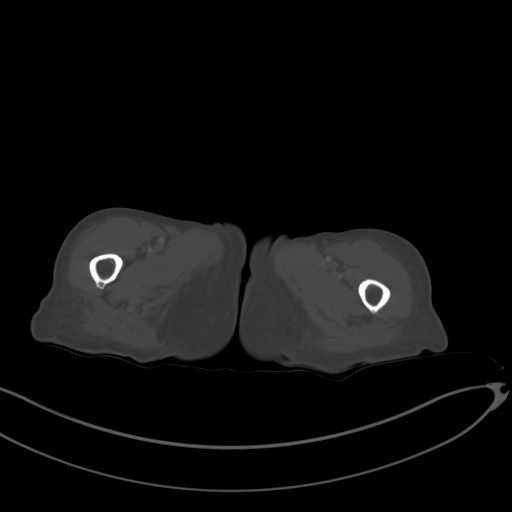
[im 9/81  soft-tissue]
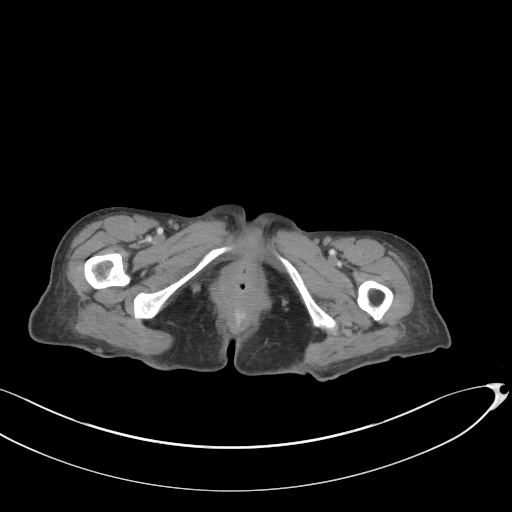
[im 18/81  soft-tissue]
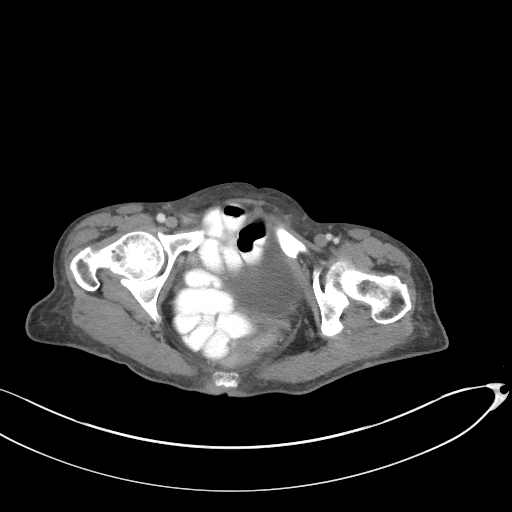
[im 23/81  soft-tissue]
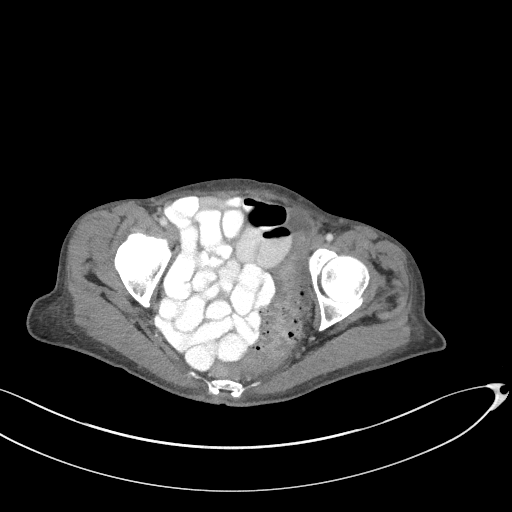
[im 27/81  soft-tissue]
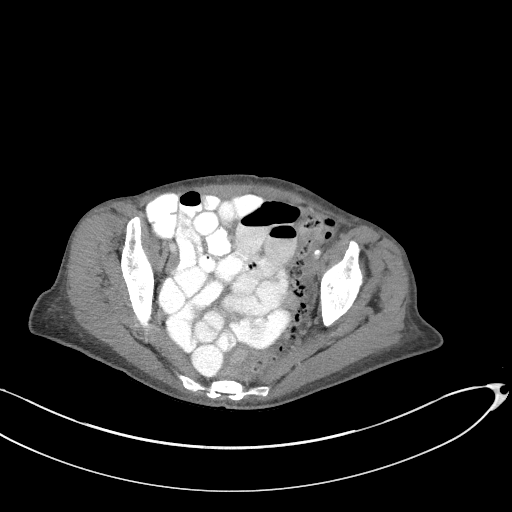
[im 36/81  soft-tissue]
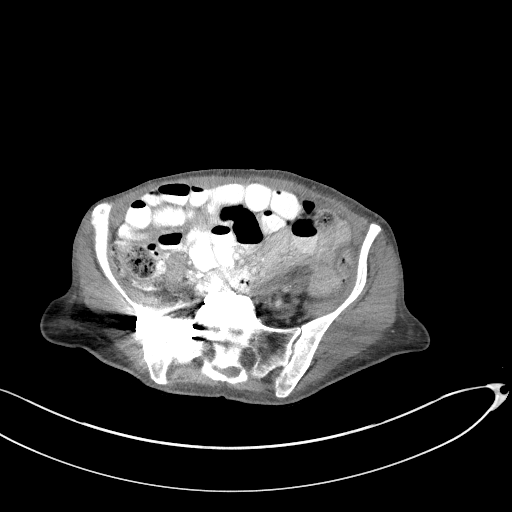
[im 41/81  soft-tissue]
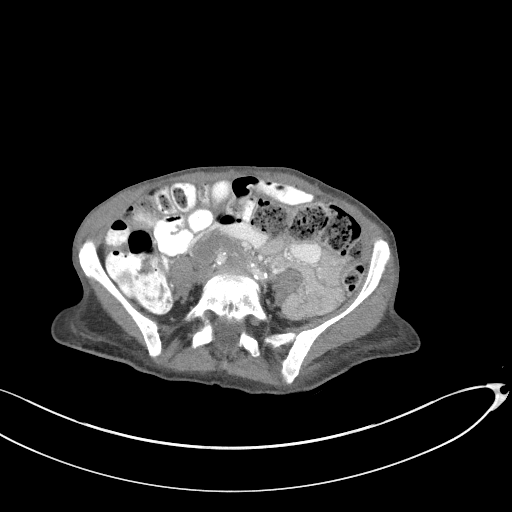
[im 45/81  soft-tissue]
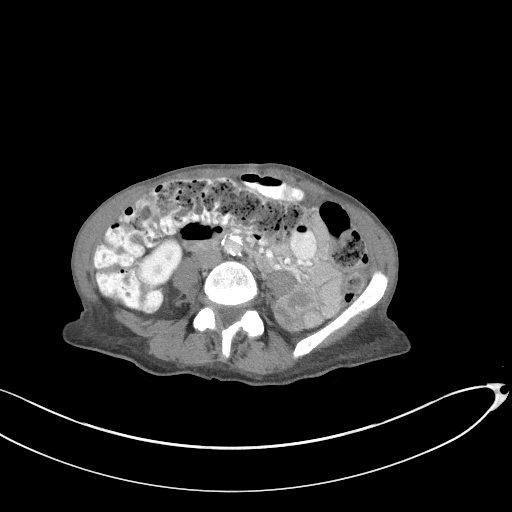
[im 54/81  soft-tissue]
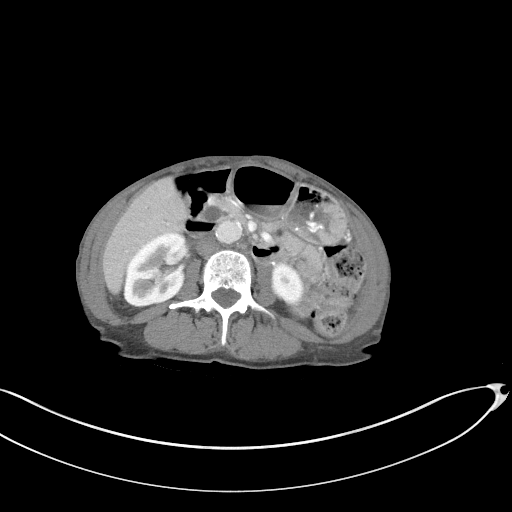
[im 54/81  bone]
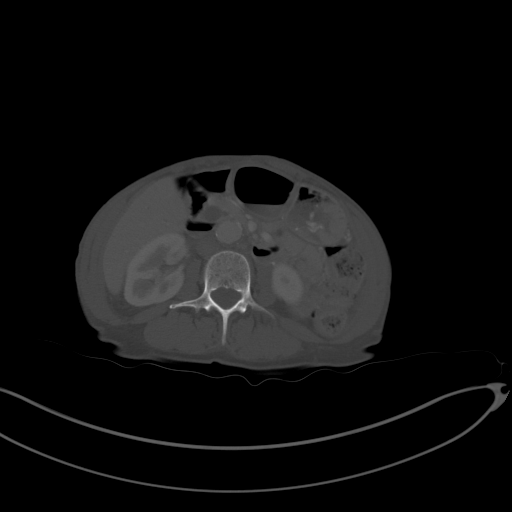
[im 58/81  soft-tissue]
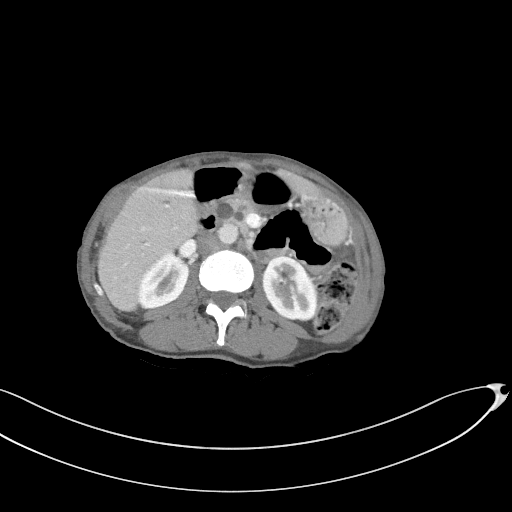
[im 63/81  soft-tissue]
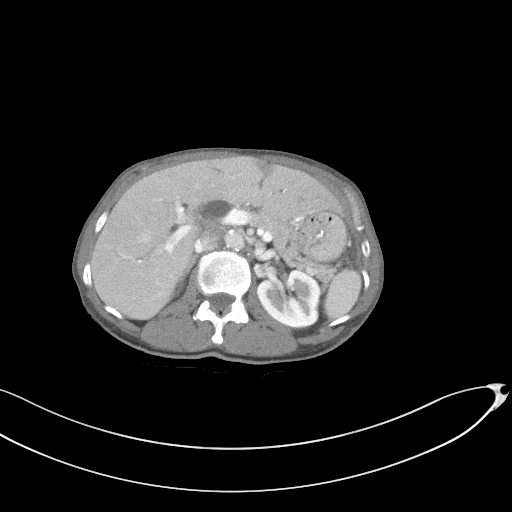
[im 72/81  soft-tissue]
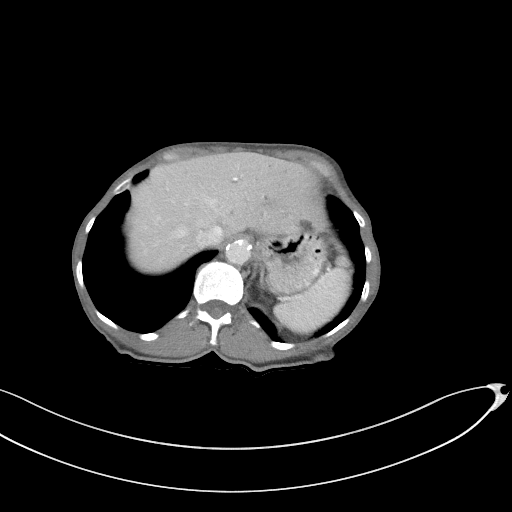
[im 76/81  soft-tissue]
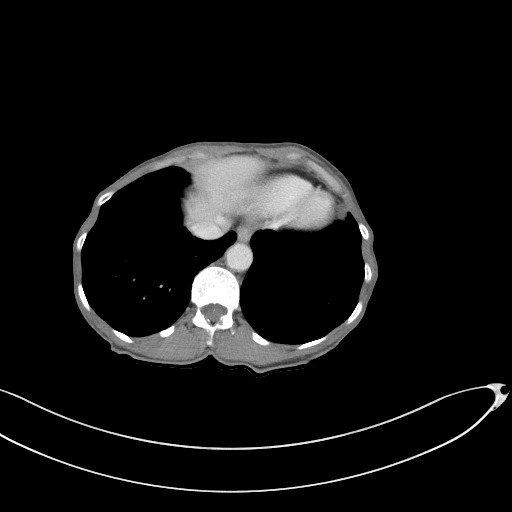

[Series 5: coronal st · coronal · 0.70mm/px · 3 of 79 slices shown]
[im 27/79  soft-tissue]
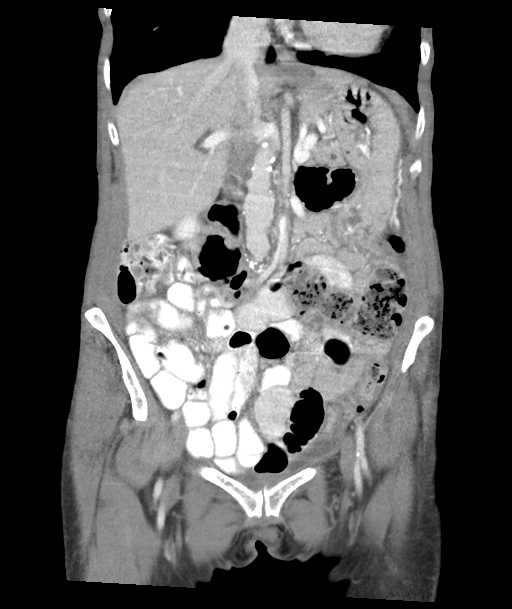
[im 35/79  soft-tissue]
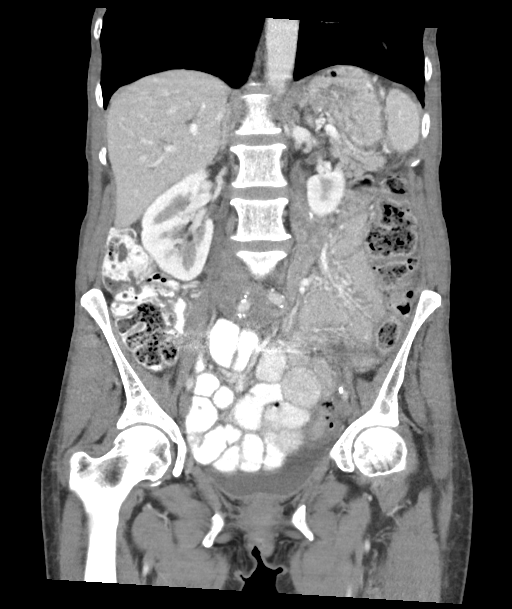
[im 44/79  soft-tissue]
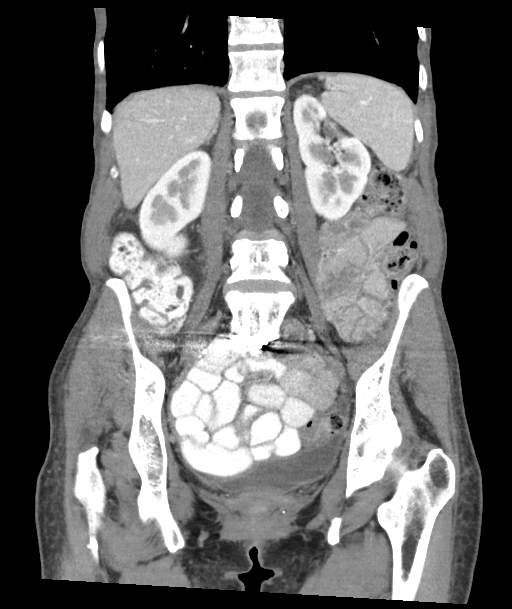

[16 of 46 positions shown; findings below may reference images not displayed]

FINDINGS: Lower chest: No acute abnormality.

Hepatobiliary: No solid liver abnormality is seen. Status post
cholecystectomy. Unchanged postoperative biliary ductal dilatation.

Pancreas: Unchanged enlargement of the pancreatic duct measuring up
to 6 mm (series 2, image 22).

Spleen: Normal in size without significant abnormality.

Adrenals/Urinary Tract: Adrenal glands are unremarkable. Small
nonobstructive calculus of the superior pole of the left kidney.
There is a small adjacent cortical defect suggesting prior insult
(series 2, image 15). The kidneys are otherwise normal. No
hydronephrosis. Bladder is unremarkable.

Stomach/Bowel: Stomach is within normal limits. Appendix appears
normal. No evidence of bowel wall thickening, distention, or
inflammatory changes. Descending and sigmoid diverticulosis.

Vascular/Lymphatic: Aortic atherosclerosis. No enlarged abdominal or
pelvic lymph nodes.

Reproductive: Status post hysterectomy

Other: No abdominal wall hernia or abnormality. No abdominopelvic
ascites.

Musculoskeletal: No acute or significant osseous findings.
IMPRESSION: 1. No acute CT findings of the abdomen or pelvis to explain lower
abdominal pain, nausea, or diarrhea.
2. Descending and sigmoid diverticulosis without evidence of acute
diverticulitis.
3. Small nonobstructive calculus of the superior pole of the left
kidney. No ureteral calculi or hydronephrosis.
4. Status post cholecystectomy and hysterectomy.
5. Unchanged postoperative biliary ductal dilatation.
6. Unchanged enlargement of the pancreatic duct measuring up to 6
mm, of doubtful clinical significance given chronicity.

Aortic Atherosclerosis ([id number]-SXS.S).

## 2022-04-10 DIAGNOSIS — G894 Chronic pain syndrome: Secondary | ICD-10-CM | POA: Diagnosis not present

## 2022-04-10 DIAGNOSIS — R309 Painful micturition, unspecified: Secondary | ICD-10-CM | POA: Diagnosis not present

## 2022-04-10 DIAGNOSIS — I700 Atherosclerosis of aorta: Secondary | ICD-10-CM | POA: Diagnosis not present

## 2022-04-10 DIAGNOSIS — J449 Chronic obstructive pulmonary disease, unspecified: Secondary | ICD-10-CM | POA: Diagnosis not present

## 2022-04-10 DIAGNOSIS — M159 Polyosteoarthritis, unspecified: Secondary | ICD-10-CM | POA: Diagnosis not present

## 2022-04-10 DIAGNOSIS — Z681 Body mass index (BMI) 19 or less, adult: Secondary | ICD-10-CM | POA: Diagnosis not present

## 2022-04-10 DIAGNOSIS — Z23 Encounter for immunization: Secondary | ICD-10-CM | POA: Diagnosis not present

## 2022-04-10 DIAGNOSIS — I10 Essential (primary) hypertension: Secondary | ICD-10-CM | POA: Diagnosis not present

## 2022-04-10 DIAGNOSIS — N390 Urinary tract infection, site not specified: Secondary | ICD-10-CM | POA: Diagnosis not present

## 2022-05-07 DIAGNOSIS — I10 Essential (primary) hypertension: Secondary | ICD-10-CM | POA: Diagnosis not present

## 2022-05-07 DIAGNOSIS — N390 Urinary tract infection, site not specified: Secondary | ICD-10-CM | POA: Diagnosis not present

## 2022-05-07 DIAGNOSIS — G8929 Other chronic pain: Secondary | ICD-10-CM | POA: Diagnosis not present

## 2022-05-07 DIAGNOSIS — M159 Polyosteoarthritis, unspecified: Secondary | ICD-10-CM | POA: Diagnosis not present

## 2022-06-06 DIAGNOSIS — G4700 Insomnia, unspecified: Secondary | ICD-10-CM | POA: Diagnosis not present

## 2022-06-06 DIAGNOSIS — G894 Chronic pain syndrome: Secondary | ICD-10-CM | POA: Diagnosis not present

## 2022-06-06 DIAGNOSIS — J449 Chronic obstructive pulmonary disease, unspecified: Secondary | ICD-10-CM | POA: Diagnosis not present

## 2022-06-06 DIAGNOSIS — M159 Polyosteoarthritis, unspecified: Secondary | ICD-10-CM | POA: Diagnosis not present

## 2022-07-04 DIAGNOSIS — M159 Polyosteoarthritis, unspecified: Secondary | ICD-10-CM | POA: Diagnosis not present

## 2022-07-04 DIAGNOSIS — G4700 Insomnia, unspecified: Secondary | ICD-10-CM | POA: Diagnosis not present

## 2022-07-04 DIAGNOSIS — J449 Chronic obstructive pulmonary disease, unspecified: Secondary | ICD-10-CM | POA: Diagnosis not present

## 2022-07-04 DIAGNOSIS — G894 Chronic pain syndrome: Secondary | ICD-10-CM | POA: Diagnosis not present

## 2022-08-01 DIAGNOSIS — M159 Polyosteoarthritis, unspecified: Secondary | ICD-10-CM | POA: Diagnosis not present

## 2022-08-01 DIAGNOSIS — I700 Atherosclerosis of aorta: Secondary | ICD-10-CM | POA: Diagnosis not present

## 2022-08-01 DIAGNOSIS — Z681 Body mass index (BMI) 19 or less, adult: Secondary | ICD-10-CM | POA: Diagnosis not present

## 2022-08-01 DIAGNOSIS — I10 Essential (primary) hypertension: Secondary | ICD-10-CM | POA: Diagnosis not present

## 2022-08-01 DIAGNOSIS — J449 Chronic obstructive pulmonary disease, unspecified: Secondary | ICD-10-CM | POA: Diagnosis not present

## 2022-08-01 DIAGNOSIS — G894 Chronic pain syndrome: Secondary | ICD-10-CM | POA: Diagnosis not present

## 2022-08-01 DIAGNOSIS — K4090 Unilateral inguinal hernia, without obstruction or gangrene, not specified as recurrent: Secondary | ICD-10-CM | POA: Diagnosis not present

## 2022-08-26 DIAGNOSIS — K4090 Unilateral inguinal hernia, without obstruction or gangrene, not specified as recurrent: Secondary | ICD-10-CM | POA: Diagnosis not present

## 2022-08-26 DIAGNOSIS — I10 Essential (primary) hypertension: Secondary | ICD-10-CM | POA: Diagnosis not present

## 2022-08-26 DIAGNOSIS — G894 Chronic pain syndrome: Secondary | ICD-10-CM | POA: Diagnosis not present

## 2022-08-26 DIAGNOSIS — J449 Chronic obstructive pulmonary disease, unspecified: Secondary | ICD-10-CM | POA: Diagnosis not present

## 2022-09-04 ENCOUNTER — Inpatient Hospital Stay (HOSPITAL_COMMUNITY)
Admission: EM | Admit: 2022-09-04 | Discharge: 2022-09-06 | DRG: 392 | Disposition: A | Discharge status: Home / Self Care | Payer: PPO | Attending: Family Medicine | Admitting: Family Medicine

## 2022-09-04 ENCOUNTER — Encounter (HOSPITAL_COMMUNITY): Payer: Self-pay

## 2022-09-04 ENCOUNTER — Other Ambulatory Visit: Payer: Self-pay

## 2022-09-04 ENCOUNTER — Emergency Department (HOSPITAL_COMMUNITY): Payer: PPO

## 2022-09-04 DIAGNOSIS — K5792 Diverticulitis of intestine, part unspecified, without perforation or abscess without bleeding: Secondary | ICD-10-CM | POA: Diagnosis not present

## 2022-09-04 DIAGNOSIS — J449 Chronic obstructive pulmonary disease, unspecified: Secondary | ICD-10-CM | POA: Diagnosis not present

## 2022-09-04 DIAGNOSIS — Z87891 Personal history of nicotine dependence: Secondary | ICD-10-CM

## 2022-09-04 DIAGNOSIS — G894 Chronic pain syndrome: Secondary | ICD-10-CM | POA: Diagnosis not present

## 2022-09-04 DIAGNOSIS — Z8249 Family history of ischemic heart disease and other diseases of the circulatory system: Secondary | ICD-10-CM | POA: Diagnosis not present

## 2022-09-04 DIAGNOSIS — K5909 Other constipation: Secondary | ICD-10-CM | POA: Diagnosis not present

## 2022-09-04 DIAGNOSIS — K219 Gastro-esophageal reflux disease without esophagitis: Secondary | ICD-10-CM | POA: Diagnosis not present

## 2022-09-04 DIAGNOSIS — E7800 Pure hypercholesterolemia, unspecified: Secondary | ICD-10-CM | POA: Diagnosis not present

## 2022-09-04 DIAGNOSIS — R109 Unspecified abdominal pain: Secondary | ICD-10-CM

## 2022-09-04 DIAGNOSIS — I341 Nonrheumatic mitral (valve) prolapse: Secondary | ICD-10-CM | POA: Diagnosis present

## 2022-09-04 DIAGNOSIS — Z981 Arthrodesis status: Secondary | ICD-10-CM | POA: Diagnosis not present

## 2022-09-04 DIAGNOSIS — Z885 Allergy status to narcotic agent status: Secondary | ICD-10-CM

## 2022-09-04 DIAGNOSIS — R0602 Shortness of breath: Secondary | ICD-10-CM | POA: Diagnosis not present

## 2022-09-04 DIAGNOSIS — Z79899 Other long term (current) drug therapy: Secondary | ICD-10-CM

## 2022-09-04 DIAGNOSIS — G8929 Other chronic pain: Secondary | ICD-10-CM | POA: Diagnosis not present

## 2022-09-04 DIAGNOSIS — E876 Hypokalemia: Secondary | ICD-10-CM

## 2022-09-04 DIAGNOSIS — F429 Obsessive-compulsive disorder, unspecified: Secondary | ICD-10-CM | POA: Diagnosis present

## 2022-09-04 DIAGNOSIS — Z880 Allergy status to penicillin: Secondary | ICD-10-CM | POA: Diagnosis not present

## 2022-09-04 DIAGNOSIS — K838 Other specified diseases of biliary tract: Secondary | ICD-10-CM | POA: Diagnosis not present

## 2022-09-04 DIAGNOSIS — F32A Depression, unspecified: Secondary | ICD-10-CM | POA: Diagnosis not present

## 2022-09-04 DIAGNOSIS — R1030 Lower abdominal pain, unspecified: Secondary | ICD-10-CM | POA: Diagnosis not present

## 2022-09-04 DIAGNOSIS — M549 Dorsalgia, unspecified: Secondary | ICD-10-CM | POA: Diagnosis not present

## 2022-09-04 DIAGNOSIS — F419 Anxiety disorder, unspecified: Secondary | ICD-10-CM | POA: Diagnosis not present

## 2022-09-04 DIAGNOSIS — Z91030 Bee allergy status: Secondary | ICD-10-CM

## 2022-09-04 DIAGNOSIS — N281 Cyst of kidney, acquired: Secondary | ICD-10-CM | POA: Diagnosis not present

## 2022-09-04 DIAGNOSIS — N200 Calculus of kidney: Secondary | ICD-10-CM | POA: Diagnosis not present

## 2022-09-04 DIAGNOSIS — R29898 Other symptoms and signs involving the musculoskeletal system: Secondary | ICD-10-CM

## 2022-09-04 LAB — COMPREHENSIVE METABOLIC PANEL
ALT: 16 U/L (ref 0–44)
AST: 23 U/L (ref 15–41)
Albumin: 4.5 g/dL (ref 3.5–5.0)
Alkaline Phosphatase: 79 U/L (ref 38–126)
Anion gap: 11 (ref 5–15)
BUN: 13 mg/dL (ref 8–23)
CO2: 23 mmol/L (ref 22–32)
Calcium: 9 mg/dL (ref 8.9–10.3)
Chloride: 99 mmol/L (ref 98–111)
Creatinine, Ser: 0.44 mg/dL (ref 0.44–1.00)
GFR, Estimated: >60 mL/min (ref >60)
Glucose, Bld: 124 mg/dL — ABNORMAL HIGH (ref 70–99)
Potassium: 3 mmol/L — ABNORMAL LOW (ref 3.5–5.1)
Sodium: 133 mmol/L — ABNORMAL LOW (ref 135–145)
Total Bilirubin: 1 mg/dL (ref 0.3–1.2)
Total Protein: 7.5 g/dL (ref 6.5–8.1)

## 2022-09-04 LAB — CBC
HCT: 47.8 % — ABNORMAL HIGH (ref 36.0–46.0)
Hemoglobin: 16.1 g/dL — ABNORMAL HIGH (ref 12.0–15.0)
MCH: 30.7 pg (ref 26.0–34.0)
MCHC: 33.7 g/dL (ref 30.0–36.0)
MCV: 91.2 fL (ref 80.0–100.0)
Platelets: 198 10*3/uL (ref 150–400)
RBC: 5.24 MIL/uL — ABNORMAL HIGH (ref 3.87–5.11)
RDW: 14 % (ref 11.5–15.5)
WBC: 8.3 10*3/uL (ref 4.0–10.5)
nRBC: 0 % (ref 0.0–0.2)

## 2022-09-04 LAB — MAGNESIUM: Magnesium: 2.1 mg/dL (ref 1.7–2.4)

## 2022-09-04 LAB — URINALYSIS, ROUTINE W REFLEX MICROSCOPIC
Bilirubin Urine: NEGATIVE
Glucose, UA: NEGATIVE mg/dL
Hgb urine dipstick: NEGATIVE
Ketones, ur: NEGATIVE mg/dL
Leukocytes,Ua: NEGATIVE
Nitrite: NEGATIVE
Protein, ur: NEGATIVE mg/dL
Specific Gravity, Urine: 1.01 (ref 1.005–1.030)
pH: 7 (ref 5.0–8.0)

## 2022-09-04 LAB — LIPASE, BLOOD: Lipase: 26 U/L (ref 11–51)

## 2022-09-04 MED ORDER — GABAPENTIN 300 MG PO CAPS
300.0000 mg | ORAL_CAPSULE | Freq: Three times a day (TID) | ORAL | Status: DC
  Administered 2022-09-04 – 2022-09-06 (×5): 300 mg via ORAL
  Filled 2022-09-04 (×5): qty 1

## 2022-09-04 MED ORDER — HYDROMORPHONE HCL 1 MG/ML IJ SOLN
1.0000 mg | Freq: Once | INTRAMUSCULAR | Status: AC
  Administered 2022-09-04: 1 mg via INTRAVENOUS
  Filled 2022-09-04: qty 1

## 2022-09-04 MED ORDER — POTASSIUM CHLORIDE 10 MEQ/100ML IV SOLN
10.0000 meq | Freq: Once | INTRAVENOUS | Status: AC
  Administered 2022-09-04: 10 meq via INTRAVENOUS
  Filled 2022-09-04: qty 100

## 2022-09-04 MED ORDER — POLYETHYLENE GLYCOL 3350 17 G PO PACK
17.0000 g | PACK | Freq: Two times a day (BID) | ORAL | Status: DC
  Administered 2022-09-04 – 2022-09-06 (×4): 17 g via ORAL
  Filled 2022-09-04 (×4): qty 1

## 2022-09-04 MED ORDER — MORPHINE SULFATE (PF) 4 MG/ML IV SOLN
4.0000 mg | INTRAVENOUS | Status: AC | PRN
  Administered 2022-09-05 – 2022-09-06 (×3): 4 mg via INTRAVENOUS
  Filled 2022-09-04 (×3): qty 1

## 2022-09-04 MED ORDER — CIPROFLOXACIN IN D5W 400 MG/200ML IV SOLN
400.0000 mg | Freq: Two times a day (BID) | INTRAVENOUS | Status: DC
  Administered 2022-09-05 – 2022-09-06 (×3): 400 mg via INTRAVENOUS
  Filled 2022-09-04 (×3): qty 200

## 2022-09-04 MED ORDER — ONDANSETRON HCL 4 MG/2ML IJ SOLN
4.0000 mg | Freq: Once | INTRAMUSCULAR | Status: AC
  Administered 2022-09-04: 4 mg via INTRAVENOUS
  Filled 2022-09-04: qty 2

## 2022-09-04 MED ORDER — IOHEXOL 300 MG/ML  SOLN
100.0000 mL | Freq: Once | INTRAMUSCULAR | Status: AC | PRN
  Administered 2022-09-04: 100 mL via INTRAVENOUS

## 2022-09-04 MED ORDER — NALOXONE HCL 0.4 MG/ML IJ SOLN: 0.4000 mg | INTRAMUSCULAR | Status: DC | PRN

## 2022-09-04 MED ORDER — ZOLPIDEM TARTRATE 5 MG PO TABS: 5.0000 mg | ORAL_TABLET | Freq: Every evening | ORAL | Status: DC | PRN

## 2022-09-04 MED ORDER — BUSPIRONE HCL 5 MG PO TABS
15.0000 mg | ORAL_TABLET | Freq: Two times a day (BID) | ORAL | Status: DC
  Administered 2022-09-04 – 2022-09-06 (×4): 15 mg via ORAL
  Filled 2022-09-04 (×5): qty 3

## 2022-09-04 MED ORDER — MORPHINE SULFATE (PF) 4 MG/ML IV SOLN
4.0000 mg | Freq: Once | INTRAVENOUS | Status: AC
  Administered 2022-09-04: 4 mg via INTRAMUSCULAR
  Filled 2022-09-04: qty 1

## 2022-09-04 MED ORDER — MORPHINE SULFATE (PF) 4 MG/ML IV SOLN: 4.0000 mg | INTRAVENOUS | Status: DC | PRN

## 2022-09-04 MED ORDER — MIRTAZAPINE 15 MG PO TABS
15.0000 mg | ORAL_TABLET | Freq: Every day | ORAL | Status: DC
  Administered 2022-09-04 – 2022-09-05 (×2): 15 mg via ORAL
  Filled 2022-09-04 (×2): qty 1

## 2022-09-04 MED ORDER — NALOXEGOL OXALATE 12.5 MG PO TABS
12.5000 mg | ORAL_TABLET | Freq: Every day | ORAL | Status: DC
  Administered 2022-09-04 – 2022-09-06 (×3): 12.5 mg via ORAL
  Filled 2022-09-04 (×5): qty 1

## 2022-09-04 MED ORDER — SODIUM CHLORIDE 0.9 % IV BOLUS
1000.0000 mL | Freq: Once | INTRAVENOUS | Status: AC
  Administered 2022-09-04: 1000 mL via INTRAVENOUS

## 2022-09-04 MED ORDER — POTASSIUM CHLORIDE 20 MEQ PO PACK
40.0000 meq | PACK | Freq: Four times a day (QID) | ORAL | Status: AC
  Administered 2022-09-04 – 2022-09-05 (×2): 40 meq via ORAL
  Filled 2022-09-04 (×2): qty 2

## 2022-09-04 MED ORDER — MORPHINE SULFATE (PF) 4 MG/ML IV SOLN
4.0000 mg | Freq: Once | INTRAVENOUS | Status: AC
  Administered 2022-09-04: 4 mg via INTRAVENOUS
  Filled 2022-09-04: qty 1

## 2022-09-04 MED ORDER — LACTATED RINGERS IV SOLN: INTRAVENOUS | Status: DC

## 2022-09-04 MED ORDER — CIPROFLOXACIN IN D5W 400 MG/200ML IV SOLN
400.0000 mg | Freq: Once | INTRAVENOUS | Status: AC
  Administered 2022-09-04: 400 mg via INTRAVENOUS
  Filled 2022-09-04: qty 200

## 2022-09-04 MED ORDER — HEPARIN SODIUM (PORCINE) 5000 UNIT/ML IJ SOLN
5000.0000 [IU] | Freq: Three times a day (TID) | INTRAMUSCULAR | Status: DC
  Administered 2022-09-05 – 2022-09-06 (×4): 5000 [IU] via SUBCUTANEOUS
  Filled 2022-09-04 (×4): qty 1

## 2022-09-04 MED ORDER — PANTOPRAZOLE SODIUM 40 MG PO TBEC
40.0000 mg | DELAYED_RELEASE_TABLET | Freq: Every day | ORAL | Status: DC
  Administered 2022-09-05 – 2022-09-06 (×2): 40 mg via ORAL
  Filled 2022-09-04 (×2): qty 1

## 2022-09-04 MED ORDER — METRONIDAZOLE 500 MG/100ML IV SOLN
500.0000 mg | Freq: Once | INTRAVENOUS | Status: AC
  Administered 2022-09-04: 500 mg via INTRAVENOUS
  Filled 2022-09-04: qty 100

## 2022-09-04 MED ORDER — TIZANIDINE HCL 4 MG PO TABS
4.0000 mg | ORAL_TABLET | Freq: Three times a day (TID) | ORAL | Status: DC
  Administered 2022-09-04: 4 mg via ORAL
  Filled 2022-09-04 (×2): qty 1

## 2022-09-04 MED ORDER — OXYCODONE HCL 5 MG PO TABS
30.0000 mg | ORAL_TABLET | ORAL | Status: DC | PRN
  Administered 2022-09-05 – 2022-09-06 (×6): 30 mg via ORAL
  Filled 2022-09-04 (×6): qty 6

## 2022-09-04 MED ORDER — ALBUTEROL SULFATE (2.5 MG/3ML) 0.083% IN NEBU: 2.5000 mg | INHALATION_SOLUTION | RESPIRATORY_TRACT | Status: DC | PRN

## 2022-09-04 MED ORDER — METRONIDAZOLE 500 MG/100ML IV SOLN
500.0000 mg | Freq: Two times a day (BID) | INTRAVENOUS | Status: DC
  Administered 2022-09-05 – 2022-09-06 (×3): 500 mg via INTRAVENOUS
  Filled 2022-09-04 (×3): qty 100

## 2022-09-04 NOTE — ED Notes (Signed)
Pt lying in bed at this time, IV to right forearm is secured with armboard and conforming bandage, secured with tape. Pt instructed not to get out of bed and use call bell for assistance. Pt and pt spouse verbalized understanding. Dr Waldron Labs at bedside and aware of pt status, pt is also having some confusion. Pt could not tell the name of hospital or town she was in, then several minutes later, after pain meds were given, pt was able to say she was in [place].

## 2022-09-04 NOTE — ED Notes (Signed)
Pt spouse walks out of room again saying pt has to urinateTheodoro Clock, EDT made aware- taking pt to bathroom now

## 2022-09-04 NOTE — ED Notes (Signed)
Pt unable to tolerate IV potassium after slowing rate to 65m/hr and running with other fluids, pt has pulled out two of her previous Iv's. Spouse at bedside.

## 2022-09-04 NOTE — ED Notes (Addendum)
Pt spouse stepped out of room saying pt was bleeding, nurse to room,pt IV had come out, pt says CT tech said it was "loose"- this nurse not notified-  gauze applied to control bleeding - EDP made aware- pt has order for one run of K+ IV- will see if it can be changed to PO, IV fluids have completed. Pt also c/o abd pain -Dr Gilford Raid aware- new orders received, will hold off on restarting IV and giving IVP K+ until CT scan results per Dr Gilford Raid.

## 2022-09-04 NOTE — ED Triage Notes (Signed)
Patient brought in by [facility] with complaints of abdominal pain. Patient states she woke up with severe RUQ abdominal pain pain that she rates 10/10 stabbing pain. Patient states she had a BM today around 1500 and states it was not normal. Patient states her BM is small in size and rock hard.

## 2022-09-04 NOTE — ED Notes (Signed)
Nurse attempted to take  vital signs, pt ripped off BP cuff, says it was squeezing to tight and causing more pain, will re attempt after pain medication has had time to take effect. Spouse at bedside.

## 2022-09-04 NOTE — H&P (Signed)
TR[facility] H&P   Patient Demographics:    Elizabeth Fowler, is a 72 y.o. female  MRN: [id number]   DOB - July 31, 1950  Admit Date - 09/04/2022  Outpatient Primary MD for the patient is Redmond School, MD  Referring MD/NP/PA: Dr Gilford Raid   Patient coming from: home   Chief Complaint  Patient presents with   Abdominal Pain      HPI:    Elizabeth Fowler  is a 72 y.o. female,  with medical history of COPD, GERD, previous tobacco abuse, hyperlipidemia, and chronic low back pain . -Presents to ED secondary to complaints of abdominal pain, patient with known chronic pain syndrome, lower back pain, but she does endorse abdominal pain, reports she woke up this morning with severe right upper quadrant abdominal pain, 10 out of 10, stabbing quality, reports she had a BM today around 3 PM, small in size, she had chronic history patient due to her opiates, denies fever, chills, chest pain or shortness of breath. -ED CT abdomen and pelvis significant for early acute diverticulitis, with known diverticulosis and chronic bile duct dilation, no significant lab abnormalities, but given patient's severe pain requiring multiple IV pain dosing, [facility] hospitalist consulted to admit.   Review of systems:     A full 10 point Review of Systems was done, except as stated above, all other Review of Systems were negative.   With Past History of the following :    Past Medical History:  Diagnosis Date   Acid reflux    occ   Anxiety    Arthritis    "right hip" (08/31/2013)   Chronic bronchitis (HCC)    "got it q yr til pneumonia shot given" (08/31/2013)   Chronic lower back pain    Complication of anesthesia    pt. states a difficult time waking up-last 2 surgeries   COPD (chronic obstructive pulmonary disease) (HCC)    Depression    Heart murmur    10 or more yrs ago echo neg unable to remember where   High  cholesterol    Migraines    "goes to bed dark room; last migraine yrs ago hx   MVP (mitral valve prolapse)    mild anterior leafleat MVP with mild MR 02/26/04 echo   OCD (obsessive compulsive disorder)    Pneumonia    "I've had pneumonia probably 30 times" (08/31/2013)   Shingles 2016   UTI (lower urinary tract infection) 03/14/2016   starting rx today      Past Surgical History:  Procedure Laterality Date   ABDOMINAL EXPOSURE N/A 08/31/2013   Procedure: ABDOMINAL EXPOSURE;  Surgeon: Rosetta Posner, MD;  Location: [facility];  Service: Vascular;  Laterality: N/A;   ABDOMINAL HYSTERECTOMY  05/1976   ANTERIOR CERVICAL DECOMP/DISCECTOMY FUSION N/A 07/06/2014   Procedure: ANTERIOR CERVICAL DECOMPRESSION/DISCECTOMY FUSION 1 LEVEL;  Surgeon: Melina Schools, MD;  Location: [facility];  Service: Orthopedics;  Laterality: N/A;   ANTERIOR LUMBAR FUSION N/A 08/31/2013   Procedure: ALIF L5 - S1 1 LEVEL;  Surgeon: Melina Schools, MD;  Location: [facility];  Service: Orthopedics;  Laterality: N/A;   APPENDECTOMY  1970's   CARPAL TUNNEL RELEASE Left    CHOLECYSTECTOMY  1970's   COLONOSCOPY WITH ESOPHAGOGASTRODUODENOSCOPY (EGD)  04/23/2009   Dr. Gala Romney: Distal erosive reflux esophagitis with noncritical Schatzki ring status post dilation. Small hiatal hernia. Mottling submucosal petechiae of the gastric mucosa, benign-appearing stellate shaped prepyloric antral ulcer both with benign biopsies and no H. pylori. On colonoscopy she had anal canal hemorrhoids, diminutive hyperplastic rectal polyp. Diverticulosis. Hepatic flexure polyp lost   COLONOSCOPY WITH PROPOFOL N/A 04/09/2017   pancolonic diverticula, one 2 mm mammillation at recto-sigmoid colon s/p removal, consistent with mucosal prolapse on biopsy.   DILATION AND CURETTAGE OF UTERUS  12/1969   erethra      ureter not urethra   KIDNEY SURGERY  05/1976   S/P hysterectomy; cut ureter; had to repair kidney/bladder; thought they removed my kidney for years; records were  destroyed; developed problems; told insides looked like mush; cleaned out the infection and repaired leaking bladder I think" (08/31/2013)   MULTIPLE EXTRACTIONS WITH ALVEOLOPLASTY Bilateral 04/27/2015   Procedure: EXTRACTIONS WITH BILATERAL TORI, MAX (R) TORI;  Surgeon: Diona Browner, DDS;  Location: [facility];  Service: Oral Surgery;  Laterality: Bilateral;   POLYPECTOMY  04/09/2017   Procedure: POLYPECTOMY;  Surgeon: Daneil Dolin, MD;  [facility];  Service: Endoscopy;;  colon   SACROILIAC JOINT FUSION Right 03/19/2016   Procedure: SACROILIAC JOINT FUSION;  Surgeon: Melina Schools, MD;  Location: [facility];  Service: Orthopedics;  Laterality: Right;   WRIST SURGERY Right 1995      Social History:     Social History   Tobacco Use   Smoking status: Former    Packs/day: 0.25    Years: 50.00    Total pack years: 12.50    Types: Cigarettes   Smokeless tobacco: Never  Substance Use Topics   Alcohol use: No    Comment: 08/31/2013 "drank a little years ago; last drink was probably in the 1990's"       Family History :     Family History  Problem Relation Age of Onset   Heart disease Mother    Hypertension Mother    Colon cancer Neg Hx    Inflammatory bowel disease Neg Hx       Home Medications:   Prior to Admission medications   Medication Sig Start Date End Date Taking? Authorizing Provider  albuterol (PROVENTIL HFA;VENTOLIN HFA) 108 (90 BASE) MCG/ACT inhaler Inhale 2 puffs into the lungs every 6 (six) hours as needed for wheezing or shortness of breath.   Yes [provider]  BELSOMRA 10 MG TABS Take 1 tablet by mouth daily. 01/03/22  Yes [provider]  busPIRone (BUSPAR) 15 MG tablet Take 15 mg by mouth 2 (two) times daily. 08/01/22  Yes [provider]  EPINEPHrine 0.3 mg/0.3 mL IJ SOAJ injection Inject 0.3 mg into the muscle as needed (allergic reaction).    Yes [provider]  gabapentin (NEURONTIN) 300 MG capsule Take 300 mg by  mouth 3 (three) times daily.    Yes [provider]  mirtazapine (REMERON) 15 MG tablet Take 15 mg by mouth at bedtime. 08/31/20  Yes [provider]  omeprazole (PRILOSEC) 40 MG capsule Take 40 mg by mouth daily. 03/16/17  Yes  [provider]  oxycodone (ROXICODONE) 30 MG immediate release tablet Take 30 mg by mouth every 4 (four) hours as needed. 08/08/20  Yes [provider]  tiZANidine (ZANAFLEX) 4 MG tablet Take 4 mg by mouth 3 (three) times daily. 03/05/17  Yes [provider]     Allergies:     Allergies  Allergen Reactions   Bee Venom Anaphylaxis   Codeine Anaphylaxis and Swelling   Penicillins Anaphylaxis, Swelling and Other (See Comments)    Has patient had a PCN reaction causing immediate rash, facial/tongue/throat swelling, SOB or lightheadedness with hypotension: Yes Has patient had a PCN reaction causing severe rash involving mucus membranes or skin necrosis: Yes Has patient had a PCN reaction that required hospitalization: No Has patient had a PCN reaction occurring within the last 10 years: No If all of the above answers are "NO", then may proceed with Cephalosporin use.     Physical Exam:   Vitals  Blood pressure (!) 156/81, pulse 73, temperature 98.2 F (36.8 C), resp. rate 17, height 5' 1"$  (1.549 m), weight 44 kg, SpO2 97 %.   1. General frail, chronically ill-appearing, in discomfort due to pain  2.  Confused, oriented x 2  3. No F.N deficits, ALL C.Nerves Intact, Strength 5/5 all 4 extremities, Sensation intact all 4 extremities, Plantars down going.  4. Ears and Eyes appear Normal, Conjunctivae clear, PERRLA. Moist Oral Mucosa.  5. Supple Neck, No JVD, No cervical lymphadenopathy appriciated, No Carotid Bruits.  6. Symmetrical Chest wall movement, Good air movement bilaterally, CTAB.  7. RRR, No Gallops, Rubs or Murmurs, No Parasternal Heave.  8. Positive Bowel Sounds, use abdominal tenderness to palpation, but  no rebound or guarding   9.  No Cyanosis, Normal Skin Turgor, No Skin Rash or Bruise.  10. Good muscle tone,  joints appear normal , no effusions, Normal ROM.  11. No Palpable Lymph Nodes in Neck or Axillae    Data Review:    CBC Recent Labs  Lab 09/04/22 1659  WBC 8.3  HGB 16.1*  HCT 47.8*  PLT 198  MCV 91.2  MCH 30.7  MCHC 33.7  RDW 14.0   ------------------------------------------------------------------------------------------------------------------  Chemistries  Recent Labs  Lab 09/04/22 1659  NA 133*  K 3.0*  CL 99  CO2 23  GLUCOSE 124*  BUN 13  CREATININE 0.44  CALCIUM 9.0  MG 2.1  AST 23  ALT 16  ALKPHOS 79  BILITOT 1.0   ------------------------------------------------------------------------------------------------------------------ estimated creatinine clearance is 44.8 mL/min (by C-G formula based on SCr of 0.44 mg/dL). ------------------------------------------------------------------------------------------------------------------ No results for input(s): "TSH", "T4TOTAL", "T3FREE", "THYROIDAB" in the last 72 hours.  Invalid input(s): "FREET3"  Coagulation profile No results for input(s): "INR", "PROTIME" in the last 168 hours. ------------------------------------------------------------------------------------------------------------------- No results for input(s): "DDIMER" in the last 72 hours. -------------------------------------------------------------------------------------------------------------------  Cardiac Enzymes No results for input(s): "CKMB", "TROPONINI", "MYOGLOBIN" in the last 168 hours.  Invalid input(s): "CK" ------------------------------------------------------------------------------------------------------------------ No results found for: "BNP"   ---------------------------------------------------------------------------------------------------------------  Urinalysis    Component Value Date/Time    COLORURINE YELLOW 01/22/2022 1312   APPEARANCEUR HAZY (A) 01/22/2022 1312   LABSPEC 1.014 01/22/2022 1312   PHURINE 8.0 01/22/2022 1312   GLUCOSEU NEGATIVE 01/22/2022 1312   HGBUR NEGATIVE 01/22/2022 1312   BILIRUBINUR NEGATIVE 01/22/2022 1312   KETONESUR 5 (A) 01/22/2022 1312   PROTEINUR 30 (A) 01/22/2022 1312   UROBILINOGEN 1.0 02/21/2014 2210   NITRITE NEGATIVE 01/22/2022 1312   LEUKOCYTESUR NEGATIVE 01/22/2022 1312    ----------------------------------------------------------------------------------------------------------------  Imaging Results:    CT ABDOMEN PELVIS W CONTRAST  Result Date: 09/04/2022 CLINICAL DATA:  Right lower quadrant abdominal pain. EXAM: CT ABDOMEN AND PELVIS WITH CONTRAST TECHNIQUE: Multidetector CT imaging of the abdomen and pelvis was performed using the standard protocol following bolus administration of intravenous contrast. RADIATION DOSE REDUCTION: This exam was performed according to the departmental dose-optimization program which includes automated exposure control, adjustment of the mA and/or kV according to patient size and/or use of iterative reconstruction technique. CONTRAST:  166m OMNIPAQUE IOHEXOL 300 MG/ML  SOLN COMPARISON:  MRI 09/19/2020.  CT 06/07/2020 FINDINGS: Lower chest: Lung bases are clear, allowing for motion artifact. Hepatobiliary: No focal liver lesions identified. Prominent intra and extrahepatic bile duct dilatation is unchanged since previous studies, likely indicating benign etiology. No mass or common duct stone is identified. The gallbladder appears to be surgically absent. Pancreas: Diffuse pancreatic ductal dilatation without mass or obstructing stone. Appearance is unchanged since prior study. Spleen: Normal in size without focal abnormality. Adrenals/Urinary Tract: No adrenal gland nodules. Kidneys are symmetrical. No hydronephrosis or hydroureter. Cyst in the upper pole left kidney measuring 1.4 cm diameter, unchanged. No  imaging follow-up is indicated. Focal scarring in the upper pole of the left kidney. 4 mm stone in the upper pole of the left kidney. Bladder is normal. Stomach/Bowel: Stomach, small bowel, and colon are not abnormally distended. Diverticulosis of the sigmoid colon. Mild mucosal thickening may indicate early changes of acute diverticulitis. No loculated collection or significant pericolonic stranding. Appendix is not identified. Vascular/Lymphatic: Aortic atherosclerosis with calcific and noncalcific plaque formation. No aneurysm. No significant lymphadenopathy. Reproductive: Status post hysterectomy. No adnexal masses. Other: No free air or free fluid in the abdomen. Abdominal wall musculature appears intact. Musculoskeletal: Postoperative changes with internal fixation at L5-S1 and of the right SI joint. Degenerative changes in the spine. IMPRESSION: 1. Stable appearance of intra and extrahepatic bile duct dilatation is well as pancreatic ductal dilatation since prior studies. No obstructing stone or lesion identified. 2. 4 mm nonobstructing stone in the left kidney. 3. Sigmoid colonic diverticulosis with diffuse wall thickening suggesting early changes of acute diverticulitis. No abscess. 4. Aortic atherosclerosis. Electronically Signed   By: WLucienne CapersM.D.   On: 09/04/2022 19:40      Assessment & Plan:    Principal Problem:   Acute diverticulitis Active Problems:   CONSTIPATION, CHRONIC   Back pain    Acute diverticulitis -Presents with abdominal pain, imaging significant for early acute diverticulitis, with known diverticulosis -Pain is significant, most likely due to her history of chronic pain syndrome and high tolerance for pain medication -Continue with as needed pain medication, she is on oxycodone 30 mg p.o. 4 times daily as needed, which will be continued, would add as needed IV morphine x 3 doses, will keep continuous pulse ox and telemetry and as needed naloxone. -Keep on clear  liquid diet and advance as tolerated -Will keep on IV fluids  Hypokalemia -Repleted, recheck in a.m.  Constipation -opiates Induced, will start on Movantik and MiraLAX  Chronic back pain Chronic pain syndrome -See above discussion regarding pain medications  History of COPD -Continue with as needed albuterol  Biliary duct dilation -Chronic findings, LFTs within normal limit, this is most likely physiologic in the setting of prior cholecystectomy     DVT Prophylaxis Heparin   AM Labs Ordered, also please review Full Orders  Family Communication: Admission, patients condition and plan of care including tests being ordered have been discussed with  the patient and husband who indicate understanding and agree with the plan and Code Status.  Code Status Full  Likely DC to  home  Condition GUARDED    Consults called: none    Admission status: observation    Time spent in minutes : 60 minutes   Phillips Climes [name] on 09/04/2022 at 9:28 PM   [facility] - Office  [phone]    h

## 2022-09-04 NOTE — ED Provider Notes (Signed)
[facility] Provider Note   CSN: [id number] Arrival date & time: 09/04/22  1544     History  Chief Complaint  Patient presents with   Abdominal Pain    Elizabeth Fowler is a 72 y.o. female.  Pt is a 72 yo female with pmhx significant for anxiety, depression, mvp, arthritis, chronic lbp, copd, ocd, and gerd.  Pt said she's had abd pain since this am.  She denies any n/v.  No fevers.       Home Medications Prior to Admission medications   Medication Sig Start Date End Date Taking? Authorizing Provider  albuterol (PROVENTIL HFA;VENTOLIN HFA) 108 (90 BASE) MCG/ACT inhaler Inhale 2 puffs into the lungs every 6 (six) hours as needed for wheezing or shortness of breath.   Yes [provider]  BELSOMRA 10 MG TABS Take 1 tablet by mouth daily. 01/03/22  Yes [provider]  busPIRone (BUSPAR) 15 MG tablet Take 15 mg by mouth 2 (two) times daily. 08/01/22  Yes [provider]  EPINEPHrine 0.3 mg/0.3 mL IJ SOAJ injection Inject 0.3 mg into the muscle as needed (allergic reaction).    Yes [provider]  gabapentin (NEURONTIN) 300 MG capsule Take 300 mg by mouth 3 (three) times daily.    Yes [provider]  mirtazapine (REMERON) 15 MG tablet Take 15 mg by mouth at bedtime. 08/31/20  Yes [provider]  omeprazole (PRILOSEC) 40 MG capsule Take 40 mg by mouth daily. 03/16/17  Yes [provider]  oxycodone (ROXICODONE) 30 MG immediate release tablet Take 30 mg by mouth every 4 (four) hours as needed. 08/08/20  Yes [provider]  tiZANidine (ZANAFLEX) 4 MG tablet Take 4 mg by mouth 3 (three) times daily. 03/05/17  Yes [provider]      Allergies    Bee venom, Codeine, and Penicillins    Review of Systems   Review of Systems  Gastrointestinal:  Positive for abdominal pain.  All other systems reviewed and are negative.   Physical Exam Updated Vital Signs BP (!)  150/63 (BP Location: Left Arm)   Pulse 67   Temp 97.8 F (36.6 C) (Oral)   Resp 18   Ht 5' 1"$  (1.549 m)   Wt 44 kg   SpO2 97%   BMI 18.33 kg/m  Physical Exam Vitals and nursing note reviewed.  Constitutional:      Appearance: She is well-developed.  HENT:     Head: Normocephalic and atraumatic.     Mouth/Throat:     Mouth: Mucous membranes are dry.  Eyes:     Extraocular Movements: Extraocular movements intact.     Pupils: Pupils are equal, round, and reactive to light.  Cardiovascular:     Rate and Rhythm: Normal rate and regular rhythm.     Heart sounds: Normal heart sounds.  Pulmonary:     Effort: Pulmonary effort is normal.     Breath sounds: Normal breath sounds.  Abdominal:     General: Abdomen is flat. Bowel sounds are normal.     Palpations: Abdomen is soft.     Tenderness: There is abdominal tenderness in the right lower quadrant.  Skin:    General: Skin is warm.     Capillary Refill: Capillary refill takes less than 2 seconds.  Neurological:     General: No focal deficit present.     Mental Status: She is alert and oriented to person, place, and time.  Psychiatric:        Mood and Affect: Mood normal.        Behavior: Behavior normal.     ED Results / Procedures / Treatments   Labs (all labs ordered are listed, but only abnormal results are displayed) Labs Reviewed  COMPREHENSIVE METABOLIC PANEL - Abnormal; Notable for the following components:      Result Value   Sodium 133 (*)    Potassium 3.0 (*)    Glucose, Bld 124 (*)    All other components within normal limits  CBC - Abnormal; Notable for the following components:   RBC 5.24 (*)    Hemoglobin 16.1 (*)    HCT 47.8 (*)    All other components within normal limits  LIPASE, BLOOD  MAGNESIUM  URINALYSIS, ROUTINE W REFLEX MICROSCOPIC    EKG None  Radiology CT ABDOMEN PELVIS W CONTRAST  Result Date: 09/04/2022 CLINICAL DATA:  Right lower quadrant abdominal pain. EXAM: CT ABDOMEN AND  PELVIS WITH CONTRAST TECHNIQUE: Multidetector CT imaging of the abdomen and pelvis was performed using the standard protocol following bolus administration of intravenous contrast. RADIATION DOSE REDUCTION: This exam was performed according to the departmental dose-optimization program which includes automated exposure control, adjustment of the mA and/or kV according to patient size and/or use of iterative reconstruction technique. CONTRAST:  155m OMNIPAQUE IOHEXOL 300 MG/ML  SOLN COMPARISON:  MRI 09/19/2020.  CT 06/07/2020 FINDINGS: Lower chest: Lung bases are clear, allowing for motion artifact. Hepatobiliary: No focal liver lesions identified. Prominent intra and extrahepatic bile duct dilatation is unchanged since previous studies, likely indicating benign etiology. No mass or common duct stone is identified. The gallbladder appears to be surgically absent. Pancreas: Diffuse pancreatic ductal dilatation without mass or obstructing stone. Appearance is unchanged since prior study. Spleen: Normal in size without focal abnormality. Adrenals/Urinary Tract: No adrenal gland nodules. Kidneys are symmetrical. No hydronephrosis or hydroureter. Cyst in the upper pole left kidney measuring 1.4 cm diameter, unchanged. No imaging follow-up is indicated. Focal scarring in the upper pole of the left kidney. 4 mm stone in the upper pole of the left kidney. Bladder is normal. Stomach/Bowel: Stomach, small bowel, and colon are not abnormally distended. Diverticulosis of the sigmoid colon. Mild mucosal thickening may indicate early changes of acute diverticulitis. No loculated collection or significant pericolonic stranding. Appendix is not identified. Vascular/Lymphatic: Aortic atherosclerosis with calcific and noncalcific plaque formation. No aneurysm. No significant lymphadenopathy. Reproductive: Status post hysterectomy. No adnexal masses. Other: No free air or free fluid in the abdomen. Abdominal wall musculature appears  intact. Musculoskeletal: Postoperative changes with internal fixation at L5-S1 and of the right SI joint. Degenerative changes in the spine. IMPRESSION: 1. Stable appearance of intra and extrahepatic bile duct dilatation is well as pancreatic ductal dilatation since prior studies. No obstructing stone or lesion identified. 2. 4 mm nonobstructing stone in the left kidney. 3. Sigmoid colonic diverticulosis with diffuse wall thickening suggesting early changes of acute diverticulitis. No abscess. 4. Aortic atherosclerosis. Electronically Signed   By: WLucienne CapersM.D.   On: 09/04/2022 19:40    Procedures Procedures    Medications Ordered in ED Medications  ciprofloxacin (CIPRO) IVPB 400 mg (0 mg Intravenous Stopped 09/04/22 2153)    And  metroNIDAZOLE (FLAGYL) IVPB 500 mg (500 mg Intravenous Transfusing/Transfer 09/04/22 2205)  polyethylene glycol (MIRALAX / GLYCOLAX) packet 17 g (has no administration in time range)  naloxegol oxalate (MOVANTIK) tablet 12.5 mg (has no administration in time range)  ciprofloxacin (CIPRO)  IVPB 400 mg (has no administration in time range)  metroNIDAZOLE (FLAGYL) IVPB 500 mg (has no administration in time range)  potassium chloride (KLOR-CON) packet 40 mEq (has no administration in time range)  lactated ringers infusion (has no administration in time range)  oxyCODONE (Oxy IR/ROXICODONE) immediate release tablet 30 mg (has no administration in time range)  morphine (PF) 4 MG/ML injection 4 mg (has no administration in time range)  naloxone (NARCAN) injection 0.4 mg (has no administration in time range)  zolpidem (AMBIEN) tablet 5 mg (has no administration in time range)  gabapentin (NEURONTIN) capsule 300 mg (has no administration in time range)  tiZANidine (ZANAFLEX) tablet 4 mg (has no administration in time range)  morphine (PF) 4 MG/ML injection 4 mg (4 mg Intravenous Given 09/04/22 1803)  ondansetron (ZOFRAN) injection 4 mg (4 mg Intravenous Given 09/04/22  1806)  sodium chloride 0.9 % bolus 1,000 mL (0 mLs Intravenous Stopped 09/04/22 1920)  iohexol (OMNIPAQUE) 300 MG/ML solution 100 mL (100 mLs Intravenous Contrast Given 09/04/22 1843)  potassium chloride 10 mEq in 100 mL IVPB (0 mEq Intravenous Stopped 09/04/22 2154)  morphine (PF) 4 MG/ML injection 4 mg (4 mg Intramuscular Given 09/04/22 1955)  HYDROmorphone (DILAUDID) injection 1 mg (1 mg Intravenous Given 09/04/22 2117)    ED Course/ Medical Decision Making/ A&P                             Medical Decision Making Amount and/or Complexity of Data Reviewed Labs: ordered. Radiology: ordered.  Risk Prescription drug management. Decision regarding hospitalization.   This patient presents to the ED for concern of abd pain, this involves an extensive number of treatment options, and is a complaint that carries with it a high risk of complications and morbidity.  The differential diagnosis includes uti, appendicitis, diverticulitis, constipation   Co morbidities that complicate the patient evaluation  anxiety, depression, mvp, arthritis, chronic lbp, copd, ocd, and gerd   Additional history obtained:  Additional history obtained from epic chart review External records from outside source obtained and reviewed including EMS report   Lab Tests:  I Ordered, and personally interpreted labs.  The pertinent results include:  cbc nl, cmp nl other than mild hypokalemia (k 3.0), mg 2.1, lip 26   Imaging Studies ordered:  I ordered imaging studies including ct abd/pelvis  I independently visualized and interpreted imaging which showed  Stable appearance of intra and extrahepatic bile duct dilatation  is well as pancreatic ductal dilatation since prior studies. No  obstructing stone or lesion identified.  2. 4 mm nonobstructing stone in the left kidney.  3. Sigmoid colonic diverticulosis with diffuse wall thickening  suggesting early changes of acute diverticulitis. No abscess.  4.  Aortic atherosclerosis.   I agree with the radiologist interpretation   Cardiac Monitoring:  The patient was maintained on a cardiac monitor.  I personally viewed and interpreted the cardiac monitored which showed an underlying rhythm of: nsr   Medicines ordered and prescription drug management:  I ordered medication including morphine/dilaudid  for pain, kcl for hypok; cipro/flagyl for diverticulitis Reevaluation of the patient after these medicines showed that the patient improved I have reviewed the patients home medicines and have made adjustments as needed   Test Considered:  ct   Critical Interventions:  Pain control/abx   Consultations Obtained:  I requested consultation with the hospitalist (Dr. [facility]),  and discussed lab and imaging findings as well as pertinent  plan - they recommend: he will admit   Problem List / ED Course:  Acute diverticulitis:  pt given iv cipro/flagyl as well as morphine/dilaudid for pain.  Pt still uncomfortable and won't be able to go home.   Hypokalemia:  replaced   Reevaluation:  After the interventions noted above, I reevaluated the patient and found that they have :improved   Social Determinants of Health:  Lives at home   Dispostion:  After consideration of the diagnostic results and the patients response to treatment, I feel that the patent would benefit from admission.          Final Clinical Impression(s) / ED Diagnoses Final diagnoses:  Diverticulitis  Intractable abdominal pain  Hypokalemia    Rx / DC Orders ED Discharge Orders     None         Isla Pence, MD 09/04/22 2224

## 2022-09-04 NOTE — ED Notes (Signed)
Nurse to room, pt pulled out IV for 2nd time, IV cath intact, bleeding controlled. pt got out of bed, spouse in room, pt sitting in chair, will attempt 3rd IV.

## 2022-09-04 NOTE — ED Notes (Signed)
Spouse walks out of room saying pt is trying to pull out IV, informed spouse to tell her not to do that, explained that I could not do anything to stop her if she does, it will have to be restarted again when she gets upstairs to admission bed.

## 2022-09-05 ENCOUNTER — Inpatient Hospital Stay (HOSPITAL_COMMUNITY): Payer: PPO

## 2022-09-05 ENCOUNTER — Encounter (HOSPITAL_COMMUNITY): Payer: Self-pay | Admitting: Family Medicine

## 2022-09-05 DIAGNOSIS — F429 Obsessive-compulsive disorder, unspecified: Secondary | ICD-10-CM | POA: Diagnosis present

## 2022-09-05 DIAGNOSIS — K219 Gastro-esophageal reflux disease without esophagitis: Secondary | ICD-10-CM | POA: Diagnosis present

## 2022-09-05 DIAGNOSIS — K5792 Diverticulitis of intestine, part unspecified, without perforation or abscess without bleeding: Secondary | ICD-10-CM | POA: Diagnosis present

## 2022-09-05 DIAGNOSIS — E7800 Pure hypercholesterolemia, unspecified: Secondary | ICD-10-CM | POA: Diagnosis present

## 2022-09-05 DIAGNOSIS — Z8249 Family history of ischemic heart disease and other diseases of the circulatory system: Secondary | ICD-10-CM | POA: Diagnosis not present

## 2022-09-05 DIAGNOSIS — G894 Chronic pain syndrome: Secondary | ICD-10-CM | POA: Diagnosis present

## 2022-09-05 DIAGNOSIS — I341 Nonrheumatic mitral (valve) prolapse: Secondary | ICD-10-CM | POA: Diagnosis present

## 2022-09-05 DIAGNOSIS — K5909 Other constipation: Secondary | ICD-10-CM | POA: Diagnosis not present

## 2022-09-05 DIAGNOSIS — Z880 Allergy status to penicillin: Secondary | ICD-10-CM | POA: Diagnosis not present

## 2022-09-05 DIAGNOSIS — J449 Chronic obstructive pulmonary disease, unspecified: Secondary | ICD-10-CM | POA: Diagnosis present

## 2022-09-05 DIAGNOSIS — Z885 Allergy status to narcotic agent status: Secondary | ICD-10-CM | POA: Diagnosis not present

## 2022-09-05 DIAGNOSIS — Z981 Arthrodesis status: Secondary | ICD-10-CM | POA: Diagnosis not present

## 2022-09-05 DIAGNOSIS — Z91030 Bee allergy status: Secondary | ICD-10-CM | POA: Diagnosis not present

## 2022-09-05 DIAGNOSIS — Z79899 Other long term (current) drug therapy: Secondary | ICD-10-CM | POA: Diagnosis not present

## 2022-09-05 DIAGNOSIS — F32A Depression, unspecified: Secondary | ICD-10-CM | POA: Diagnosis present

## 2022-09-05 DIAGNOSIS — F419 Anxiety disorder, unspecified: Secondary | ICD-10-CM | POA: Diagnosis present

## 2022-09-05 DIAGNOSIS — Z87891 Personal history of nicotine dependence: Secondary | ICD-10-CM | POA: Diagnosis not present

## 2022-09-05 DIAGNOSIS — K838 Other specified diseases of biliary tract: Secondary | ICD-10-CM | POA: Diagnosis present

## 2022-09-05 DIAGNOSIS — E876 Hypokalemia: Secondary | ICD-10-CM | POA: Diagnosis present

## 2022-09-05 LAB — COMPREHENSIVE METABOLIC PANEL
ALT: 17 U/L (ref 0–44)
AST: 22 U/L (ref 15–41)
Albumin: 4 g/dL (ref 3.5–5.0)
Alkaline Phosphatase: 70 U/L (ref 38–126)
Anion gap: 10 (ref 5–15)
BUN: 11 mg/dL (ref 8–23)
CO2: 23 mmol/L (ref 22–32)
Calcium: 8.9 mg/dL (ref 8.9–10.3)
Chloride: 103 mmol/L (ref 98–111)
Creatinine, Ser: 0.51 mg/dL (ref 0.44–1.00)
GFR, Estimated: >60 mL/min (ref >60)
Glucose, Bld: 108 mg/dL — ABNORMAL HIGH (ref 70–99)
Potassium: 3.4 mmol/L — ABNORMAL LOW (ref 3.5–5.1)
Sodium: 136 mmol/L (ref 135–145)
Total Bilirubin: 1 mg/dL (ref 0.3–1.2)
Total Protein: 6.9 g/dL (ref 6.5–8.1)

## 2022-09-05 LAB — CBC
HCT: 46.2 % — ABNORMAL HIGH (ref 36.0–46.0)
Hemoglobin: 15.7 g/dL — ABNORMAL HIGH (ref 12.0–15.0)
MCH: 30.5 pg (ref 26.0–34.0)
MCHC: 34 g/dL (ref 30.0–36.0)
MCV: 89.7 fL (ref 80.0–100.0)
Platelets: 198 10*3/uL (ref 150–400)
RBC: 5.15 MIL/uL — ABNORMAL HIGH (ref 3.87–5.11)
RDW: 13.8 % (ref 11.5–15.5)
WBC: 9.1 10*3/uL (ref 4.0–10.5)
nRBC: 0 % (ref 0.0–0.2)

## 2022-09-05 MED ORDER — HYDRALAZINE HCL 20 MG/ML IJ SOLN: 10.0000 mg | INTRAMUSCULAR | Status: DC | PRN

## 2022-09-05 MED ORDER — ALBUTEROL SULFATE (2.5 MG/3ML) 0.083% IN NEBU: 3.0000 mL | INHALATION_SOLUTION | Freq: Four times a day (QID) | RESPIRATORY_TRACT | Status: DC | PRN

## 2022-09-05 MED ORDER — CYCLOBENZAPRINE HCL 10 MG PO TABS
5.0000 mg | ORAL_TABLET | Freq: Three times a day (TID) | ORAL | Status: DC | PRN
  Administered 2022-09-06: 5 mg via ORAL
  Filled 2022-09-05: qty 1

## 2022-09-05 MED ORDER — POTASSIUM CHLORIDE CRYS ER 20 MEQ PO TBCR
20.0000 meq | EXTENDED_RELEASE_TABLET | Freq: Two times a day (BID) | ORAL | Status: AC
  Administered 2022-09-05 (×2): 20 meq via ORAL
  Filled 2022-09-05 (×2): qty 1

## 2022-09-05 MED ORDER — IPRATROPIUM-ALBUTEROL 0.5-2.5 (3) MG/3ML IN SOLN
3.0000 mL | Freq: Four times a day (QID) | RESPIRATORY_TRACT | Status: DC
  Administered 2022-09-05 (×2): 3 mL via RESPIRATORY_TRACT
  Filled 2022-09-05 (×3): qty 3

## 2022-09-05 MED ORDER — FUROSEMIDE 10 MG/ML IJ SOLN
30.0000 mg | Freq: Once | INTRAMUSCULAR | Status: AC
  Administered 2022-09-05: 30 mg via INTRAVENOUS
  Filled 2022-09-05: qty 4

## 2022-09-05 NOTE — Progress Notes (Signed)
PROGRESS NOTE   Elizabeth Fowler  [id number] DOB: 10/31/1950 DOA: 09/04/2022 PCP: Redmond School, MD   Chief Complaint  Patient presents with   Abdominal Pain   Level of care: Med-Surg  Brief Admission History:  72 y.o. female,  with medical history of COPD, GERD, previous tobacco abuse, hyperlipidemia, and chronic low back pain . -Presents to ED secondary to complaints of abdominal pain, patient with known chronic pain syndrome, lower back pain, but she does endorse abdominal pain, reports she woke up this morning with severe right upper quadrant abdominal pain, 10 out of 10, stabbing quality, reports she had a BM today around 3 PM, small in size, she had chronic history patient due to her opiates, denies fever, chills, chest pain or shortness of breath. -ED CT abdomen and pelvis significant for early acute diverticulitis, with known diverticulosis and chronic bile duct dilation, no significant lab abnormalities, but given patient's severe pain requiring multiple IV pain dosing, [facility] hospitalist consulted to admit.   Assessment and Plan:  Acute diverticulitis -Presented with severe generalized abdominal pain, imaging significant for early acute diverticulitis, with known diverticulosis -Pain is significant, most likely exacerbated due to her history of chronic pain syndrome and high tolerance for pain medication -Continue with as needed pain medication, she is on oxycodone 30 mg p.o. 4 times daily as needed, which will be continued, would add as needed IV morphine will keep continuous pulse ox and telemetry and as needed naloxone. -only able to tolerate clears -advance diet as tolerated to full liquids -continue IV cipro/metronidazole   Hypokalemia -Repleted, recheck in a.m.   Constipation -opiates Induced, will start on Movantik and MiraLAX   Chronic back pain Chronic pain syndrome -See above discussion regarding pain medications   History of COPD -Continue with as needed  albuterol   Biliary duct dilation -Chronic findings, LFTs within normal limit, this is most likely physiologic in the setting of prior cholecystectomy   DVT prophylaxis: [place] heparin Code Status: full  Family Communication:  Disposition: Status is: Inpatient Remains inpatient appropriate because: IV antibiotics    Consultants:   Procedures:   Antimicrobials:  Ciprofloxacin/metronidazole 2/15>>   Subjective: Pt reports being in severe abdominal pain, only able to tolerate sips of clears.   Objective: Vitals:   09/04/22 2110 09/04/22 2218 09/05/22 0151 09/05/22 0511  BP: (!) 156/81 (!) 150/63 (!) 165/73 (!) 140/74  Pulse: 73 67 80 80  Resp: 17 18 16 18  $ Temp: 98.2 F (36.8 C) 97.8 F (36.6 C) 98.6 F (37 C) 98.5 F (36.9 C)  TempSrc:  Oral Oral Oral  SpO2: 97% 97% 91% 94%  Weight:      Height:        Intake/Output Summary (Last 24 hours) at 09/05/2022 1058 Last data filed at 09/05/2022 0910 Gross per 24 hour  Intake 508.22 ml  Output --  Net 508.22 ml   Filed Weights   09/04/22 1617  Weight: 44 kg   Examination:  General exam: Appears moderately distressed  Respiratory system: Clear to auscultation. Respiratory effort normal. Cardiovascular system: normal S1 & S2 heard. No JVD, murmurs, rubs, gallops or clicks. No pedal edema. Gastrointestinal system: Abdomen is nondistended, soft but diffusely tender. No organomegaly or masses felt. Normal bowel sounds heard. Central nervous system: Alert and oriented. No focal neurological deficits. Extremities: Symmetric 5 x 5 power. Skin: No rashes, lesions or ulcers. Psychiatry: Judgement and insight appear normal. Mood & affect appropriate.   Data Reviewed: I have personally reviewed  following labs and imaging studies  CBC: Recent Labs  Lab 09/04/22 1659 09/05/22 0409  WBC 8.3 9.1  HGB 16.1* 15.7*  HCT 47.8* 46.2*  MCV 91.2 89.7  PLT 198 [id number]    Basic Metabolic Panel: Recent Labs  Lab 09/04/22 1659  09/05/22 0409  NA 133* 136  K 3.0* 3.4*  CL 99 103  CO2 23 23  GLUCOSE 124* 108*  BUN 13 11  CREATININE 0.44 0.51  CALCIUM 9.0 8.9  MG 2.1  --     CBG: No results for input(s): "GLUCAP" in the last 168 hours.  No results found for this or any previous visit (from the past 240 hour(s)).   Radiology Studies: CT ABDOMEN PELVIS W CONTRAST  Result Date: 09/04/2022 CLINICAL DATA:  Right lower quadrant abdominal pain. EXAM: CT ABDOMEN AND PELVIS WITH CONTRAST TECHNIQUE: Multidetector CT imaging of the abdomen and pelvis was performed using the standard protocol following bolus administration of intravenous contrast. RADIATION DOSE REDUCTION: This exam was performed according to the departmental dose-optimization program which includes automated exposure control, adjustment of the mA and/or kV according to patient size and/or use of iterative reconstruction technique. CONTRAST:  163m OMNIPAQUE IOHEXOL 300 MG/ML  SOLN COMPARISON:  MRI 09/19/2020.  CT 06/07/2020 FINDINGS: Lower chest: Lung bases are clear, allowing for motion artifact. Hepatobiliary: No focal liver lesions identified. Prominent intra and extrahepatic bile duct dilatation is unchanged since previous studies, likely indicating benign etiology. No mass or common duct stone is identified. The gallbladder appears to be surgically absent. Pancreas: Diffuse pancreatic ductal dilatation without mass or obstructing stone. Appearance is unchanged since prior study. Spleen: Normal in size without focal abnormality. Adrenals/Urinary Tract: No adrenal gland nodules. Kidneys are symmetrical. No hydronephrosis or hydroureter. Cyst in the upper pole left kidney measuring 1.4 cm diameter, unchanged. No imaging follow-up is indicated. Focal scarring in the upper pole of the left kidney. 4 mm stone in the upper pole of the left kidney. Bladder is normal. Stomach/Bowel: Stomach, small bowel, and colon are not abnormally distended. Diverticulosis of the  sigmoid colon. Mild mucosal thickening may indicate early changes of acute diverticulitis. No loculated collection or significant pericolonic stranding. Appendix is not identified. Vascular/Lymphatic: Aortic atherosclerosis with calcific and noncalcific plaque formation. No aneurysm. No significant lymphadenopathy. Reproductive: Status post hysterectomy. No adnexal masses. Other: No free air or free fluid in the abdomen. Abdominal wall musculature appears intact. Musculoskeletal: Postoperative changes with internal fixation at L5-S1 and of the right SI joint. Degenerative changes in the spine. IMPRESSION: 1. Stable appearance of intra and extrahepatic bile duct dilatation is well as pancreatic ductal dilatation since prior studies. No obstructing stone or lesion identified. 2. 4 mm nonobstructing stone in the left kidney. 3. Sigmoid colonic diverticulosis with diffuse wall thickening suggesting early changes of acute diverticulitis. No abscess. 4. Aortic atherosclerosis. Electronically Signed   By: WLucienne CapersM.D.   On: 09/04/2022 19:40    Scheduled Meds:  busPIRone  15 mg Oral BID   gabapentin  300 mg Oral TID   heparin  5,000 Units Subcutaneous Q8H   mirtazapine  15 mg Oral QHS   naloxegol oxalate  12.5 mg Oral Daily   pantoprazole  40 mg Oral Daily   polyethylene glycol  17 g Oral BID   potassium chloride  20 mEq Oral BID   Continuous Infusions:  ciprofloxacin 400 mg (09/05/22 1012)   lactated ringers Stopped (09/05/22 0100)   metronidazole 500 mg (09/05/22 0859)  LOS: 0 days   Time spent: 35 mins  Lark Runk Wynetta Emery, MD How to contact the [facility] Attending or Consulting provider [place] or covering provider during after hours [place], for this patient?  Check the care team in [facility] and look for a) attending/consulting [facility] provider listed and b) the [facility] team listed Log into [email or web address] and use [place]'s universal password to access. If you do not have the password, please contact  the hospital operator. Locate the [facility] provider you are looking for under [facility] and page to a number that you can be directly reached. If you still have difficulty reaching the provider, please page the [facility] (Director on Call) for the Hospitalists listed on amion for assistance.  09/05/2022, 10:58 AM

## 2022-09-05 NOTE — Evaluation (Signed)
Physical Therapy Evaluation Patient Details Name: Elizabeth Fowler MRN: [id number] DOB: 08-24-1950 Today's Date: 09/05/2022  History of Present Illness  Elizabeth Fowler  is a 72 y.o. female,  with medical history of COPD, GERD, previous tobacco abuse, hyperlipidemia, and chronic low back pain .  -Presents to ED secondary to complaints of abdominal pain, patient with known chronic pain syndrome, lower back pain, but she does endorse abdominal pain, reports she woke up this morning with severe right upper quadrant abdominal pain, 10 out of 10, stabbing quality, reports she had a BM today around 3 PM, small in size, she had chronic history patient due to her opiates, denies fever, chills, chest pain or shortness of breath.   Clinical Impression  Patient presents with sitter in room mostly due to pulling out IV's and mild agitation.  Patient demonstrates good return for sitting up at bedside, unsteady using SPC during transfer to chair, had to use RW for taking steps in room, but limited secondary to c/o fatigue and left stomach pain.  Patient tolerated sitting up in chair after therapy with sitter in room.  Patient will benefit from continued skilled physical therapy in hospital and recommended venue below to increase strength, balance, endurance for safe ADLs and gait.         Recommendations for follow up therapy are one component of a multi-disciplinary discharge planning process, led by the attending physician.  Recommendations may be updated based on patient status, additional functional criteria and insurance authorization.  Follow Up Recommendations Home health PT      Assistance Recommended at Discharge Intermittent Supervision/Assistance  Patient can return home with the following  A little help with walking and/or transfers;A little help with bathing/dressing/bathroom;Help with stairs or ramp for entrance;Assistance with cooking/housework    Equipment Recommendations None recommended by PT   Recommendations for Other Services       Functional Status Assessment Patient has had a recent decline in their functional status and demonstrates the ability to make significant improvements in function in a reasonable and predictable amount of time.     Precautions / Restrictions Precautions Precautions: Fall Restrictions Weight Bearing Restrictions: No      Mobility  Bed Mobility Overal bed mobility: Modified Independent                  Transfers Overall transfer level: Needs assistance Equipment used: Rolling walker (2 wheels), Straight cane Transfers: Sit to/from Stand, Bed to chair/wheelchair/BSC Sit to Stand: Min guard   Step pivot transfers: Min assist, Min guard       General transfer comment: unsteady labored movement using SPC, safer using RW    Ambulation/Gait Ambulation/Gait assistance: Herbalist (Feet): 10 Feet Assistive device: Rolling walker (2 wheels) Gait Pattern/deviations: Decreased step length - right, Decreased step length - left, Decreased stride length Gait velocity: slow     General Gait Details: limited to a few steps in room before having to stop due to c/o fatigue and stomach pain  Stairs            Wheelchair Mobility    Modified Rankin (Stroke Patients Only)       Balance Overall balance assessment: Needs assistance Sitting-balance support: Feet supported, No upper extremity supported Sitting balance-Leahy Scale: Fair Sitting balance - Comments: fair/good seated at EOB   Standing balance support: During functional activity, Single extremity supported Standing balance-Leahy Scale: Poor Standing balance comment: fair using RW  Pertinent Vitals/Pain Pain Assessment Pain Assessment: 0-10 Pain Score: 5  Pain Location: left side of stomach Pain Descriptors / Indicators: Aching Pain Intervention(s): Limited activity within patient's tolerance, Monitored during  session, Repositioned    Home Living Family/patient expects to be discharged to:: Private residence Living Arrangements: Spouse/significant other Available Help at Discharge: Family;Available 24 hours/day Type of Home: House Home Access: Stairs to enter Entrance Stairs-Rails: Right Entrance Stairs-Number of Steps: 4   Home Layout: One level Home Equipment: Conservation officer, nature (2 wheels);Cane - single point;Wheelchair - Brewing technologist      Prior Function Prior Level of Function : Independent/Modified Independent             Mobility Comments: household and short distanced community using [facility] ADLs Comments: assisted by family     Hand Dominance   Dominant Hand: Right    Extremity/Trunk Assessment   Upper Extremity Assessment Upper Extremity Assessment: Overall WFL for tasks assessed    Lower Extremity Assessment Lower Extremity Assessment: Generalized weakness    Cervical / Trunk Assessment Cervical / Trunk Assessment: Normal  Communication   Communication: No difficulties  Cognition Arousal/Alertness: Awake/alert Behavior During Therapy: WFL for tasks assessed/performed, Anxious Overall Cognitive Status: Within Functional Limits for tasks assessed                                          General Comments      Exercises     Assessment/Plan    PT Assessment Patient needs continued PT services  PT Problem List Decreased strength;Decreased activity tolerance;Decreased balance;Decreased mobility       PT Treatment Interventions DME instruction;Gait training;Stair training;Functional mobility training;Therapeutic activities;Therapeutic exercise;Patient/family education;Balance training    PT Goals (Current goals can be found in the Care Plan section)  Acute Rehab PT Goals Patient Stated Goal: return hom with family to assist PT Goal Formulation: With patient Time For Goal Achievement: 09/12/22 Potential to Achieve Goals: Good     Frequency Min 3X/week     Co-evaluation               AM-PAC PT "6 Clicks" Mobility  Outcome Measure Help needed turning from your back to your side while in a flat bed without using bedrails?: None Help needed moving from lying on your back to sitting on the side of a flat bed without using bedrails?: A Little Help needed moving to and from a bed to a chair (including a wheelchair)?: A Little Help needed standing up from a chair using your arms (e.g., wheelchair or bedside chair)?: A Little Help needed to walk in hospital room?: A Little Help needed climbing 3-5 steps with a railing? : A Lot 6 Click Score: 18    End of Session   Activity Tolerance: Patient tolerated treatment well;Patient limited by fatigue Patient left: in chair;with call bell/phone within reach;with nursing/sitter in room Nurse Communication: Mobility status PT Visit Diagnosis: Unsteadiness on feet (R26.81);Other abnormalities of gait and mobility (R26.89);Muscle weakness (generalized) (M62.81)    Time: [id number] PT Time Calculation (min) (ACUTE ONLY): 18 min   Charges:   PT Evaluation $PT Eval Low Complexity: 1 Low PT Treatments $Therapeutic Activity: 8-22 mins        2:36 PM, 09/05/22 Lonell Grandchild, MPT Physical Therapist with [facility] [phone] office [phone] mobile phone

## 2022-09-05 NOTE — Hospital Course (Signed)
72 y.o. female,  with medical history of COPD, GERD, previous tobacco abuse, hyperlipidemia, and chronic low back pain . -Presents to ED secondary to complaints of abdominal pain, patient with known chronic pain syndrome, lower back pain, but she does endorse abdominal pain, reports she woke up this morning with severe right upper quadrant abdominal pain, 10 out of 10, stabbing quality, reports she had a BM today around 3 PM, small in size, she had chronic history patient due to her opiates, denies fever, chills, chest pain or shortness of breath. -ED CT abdomen and pelvis significant for early acute diverticulitis, with known diverticulosis and chronic bile duct dilation, no significant lab abnormalities, but given patient's severe pain requiring multiple IV pain dosing, [facility] hospitalist consulted to admit.

## 2022-09-05 NOTE — Progress Notes (Signed)
  Transition of Care [facility]) Screening Note   Patient Details  Name: Elizabeth Fowler Date of Birth: [birth year]   [facility] CM/SW Contact:    Ihor Gully, LCSW Phone Number: 09/05/2022, 1:37 PM    Transition of Care Department [facility]) has reviewed patient and no TOC needs have been identified at this time. We will continue to monitor patient advancement through interdisciplinary progression rounds. If new patient transition needs arise, please place a TOC consult.    PT recommends HHPT. Patient does not desire HHPT. Patient is agreeable to OPPT. Chooses [place] OPPT. Referral made.

## 2022-09-05 NOTE — Plan of Care (Signed)
  Problem: Acute Rehab PT Goals(only PT should resolve) Goal: Pt Will Go Supine/Side To Sit Outcome: Progressing Flowsheets (Taken 09/05/2022 1437) Pt will go Supine/Side to Sit:  Independently  with modified independence Goal: Patient Will Transfer Sit To/From Stand Outcome: Progressing Flowsheets (Taken 09/05/2022 1437) Patient will transfer sit to/from stand:  with supervision  with modified independence Goal: Pt Will Transfer Bed To Chair/Chair To Bed Outcome: Progressing Flowsheets (Taken 09/05/2022 1437) Pt will Transfer Bed to Chair/Chair to Bed:  with supervision  with modified independence Goal: Pt Will Ambulate Outcome: Progressing Flowsheets (Taken 09/05/2022 1437) Pt will Ambulate:  75 feet  50 feet  with minimal assist  with rolling walker  with least restrictive assistive device   2:38 PM, 09/05/22 Elizabeth Fowler, MPT Physical Therapist with [facility] 336 [phone] office [phone] mobile phone

## 2022-09-05 NOTE — Progress Notes (Signed)
This nurse was called into pt room d/t pt c/o not being able to breathe and abd pain. 10:10. BP 180/100 manually, O2 96 on RA. MD notified. Will continue to monitor.

## 2022-09-05 NOTE — Progress Notes (Addendum)
Pt pulled out IV. Staff attempted to restart IV x2. Another staff successfully restarted IV. However, before staff could wrap IV, pt pulled it out. MD aware pt has no IV access at this time.

## 2022-09-06 DIAGNOSIS — K5909 Other constipation: Secondary | ICD-10-CM | POA: Diagnosis not present

## 2022-09-06 DIAGNOSIS — K5792 Diverticulitis of intestine, part unspecified, without perforation or abscess without bleeding: Secondary | ICD-10-CM | POA: Diagnosis not present

## 2022-09-06 LAB — BASIC METABOLIC PANEL
Anion gap: 10 (ref 5–15)
BUN: 18 mg/dL (ref 8–23)
CO2: 24 mmol/L (ref 22–32)
Calcium: 9 mg/dL (ref 8.9–10.3)
Chloride: 105 mmol/L (ref 98–111)
Creatinine, Ser: 0.64 mg/dL (ref 0.44–1.00)
GFR, Estimated: >60 mL/min (ref >60)
Glucose, Bld: 97 mg/dL (ref 70–99)
Potassium: 3.6 mmol/L (ref 3.5–5.1)
Sodium: 139 mmol/L (ref 135–145)

## 2022-09-06 MED ORDER — METRONIDAZOLE 500 MG PO TABS: 500.0000 mg | ORAL_TABLET | Freq: Two times a day (BID) | ORAL | 0 refills | Status: AC

## 2022-09-06 MED ORDER — MORPHINE SULFATE (PF) 2 MG/ML IV SOLN
2.0000 mg | Freq: Once | INTRAVENOUS | Status: AC
  Administered 2022-09-06: 2 mg via INTRAVENOUS
  Filled 2022-09-06: qty 1

## 2022-09-06 MED ORDER — CIPROFLOXACIN HCL 500 MG PO TABS: 500.0000 mg | ORAL_TABLET | Freq: Two times a day (BID) | ORAL | 0 refills | Status: AC

## 2022-09-06 MED ORDER — POLYETHYLENE GLYCOL 3350 17 G PO PACK: 17.0000 g | PACK | Freq: Every day | ORAL | 2 refills | Status: AC

## 2022-09-06 MED ORDER — CIPROFLOXACIN HCL 100 MG PO TABS: 100.0000 mg | ORAL_TABLET | Freq: Two times a day (BID) | ORAL | 0 refills | Status: DC

## 2022-09-06 NOTE — Progress Notes (Signed)
Pt discharged via WC to POV.

## 2022-09-06 NOTE — TOC Transition Note (Signed)
Per chart review PT recommends HHPT. Patient does not desire HHPT. Patient is agreeable to OPPT. Chooses [place] OPPT. Referral made.    No additional TOC needs

## 2022-09-06 NOTE — Discharge Summary (Addendum)
Physician Discharge Summary  Elizabeth Fowler [id number] DOB: 1950-08-18 DOA: 09/04/2022  PCP: Redmond School, MD  Admit date: 09/04/2022 Discharge date: 09/06/2022  Admitted From:  Home  Disposition: Home   Recommendations for Outpatient Follow-up:  Follow up with PCP in 1 weeks  Home Health:  outpatient PT ordered   Discharge Condition: STABLE   CODE STATUS: FULL DIET: soft foods    Brief Hospitalization Summary: Please see all hospital notes, images, labs for full details of the hospitalization.   ADMISSION HPI:  72 y.o. female,  with medical history of COPD, GERD, previous tobacco abuse, hyperlipidemia, and chronic low back pain. -Presents to ED secondary to complaints of abdominal pain, patient with known chronic pain syndrome, lower back pain, but she does endorse abdominal pain, reports she woke up this morning with severe right upper quadrant abdominal pain, 10 out of 10, stabbing quality, reports she had a BM today around 3 PM, small in size, she had chronic history patient due to her opiates, denies fever, chills, chest pain or shortness of breath. -ED CT abdomen and pelvis significant for early acute diverticulitis, with known diverticulosis and chronic bile duct dilation, no significant lab abnormalities, but given patient's severe pain requiring multiple IV pain dosing, [facility] consulted to admit.   Hospital Course by problem   Acute diverticulitis -Presented with severe generalized abdominal pain, imaging significant for early acute diverticulitis, with known diverticulosis -Pain is significant, most likely exacerbated due to her history of chronic pain syndrome and high tolerance for pain medication -Continue with as needed pain medication, she is on oxycodone 30 mg p.o. 4 times daily as needed, which will be continued, would add as needed IV morphine will keep continuous pulse ox and telemetry and as needed naloxone. -only able to tolerate clears -advanced  diet as tolerated -treated with IV cipro/metronidazole -Pt will discharge home on oral cipro and metronidazole x 7 more days to complete course of treatment    Hypokalemia -Repleted   Constipation -opiates Induced, continue MiraLAX   Chronic back pain Chronic pain syndrome -See above discussion regarding pain medications   History of COPD -Continue with as needed albuterol   Biliary duct dilation -Chronic findings, LFTs within normal limit, this is most likely physiologic in the setting of prior cholecystectomy  Discharge Diagnoses:  Principal Problem:   Acute diverticulitis Active Problems:   CONSTIPATION, CHRONIC   Back pain   Discharge Instructions: Discharge Instructions     Ambulatory referral to Physical Therapy   Complete by: As directed       Allergies as of 09/06/2022       Reactions   Bee Venom Anaphylaxis   Codeine Anaphylaxis, Swelling   Penicillins Anaphylaxis, Swelling, Other (See Comments)   Has patient had a PCN reaction causing immediate rash, facial/tongue/throat swelling, SOB or lightheadedness with hypotension: Yes Has patient had a PCN reaction causing severe rash involving mucus membranes or skin necrosis: Yes Has patient had a PCN reaction that required hospitalization: No Has patient had a PCN reaction occurring within the last 10 years: No If all of the above answers are "NO", then may proceed with Cephalosporin use.        Medication List     STOP taking these medications    tiZANidine 4 MG tablet Commonly known as: ZANAFLEX       TAKE these medications    albuterol 108 (90 Base) MCG/ACT inhaler Commonly known as: VENTOLIN HFA Inhale 2 puffs into the lungs every 6 (  six) hours as needed for wheezing or shortness of breath.   Belsomra 10 MG Tabs Generic drug: Suvorexant Take 1 tablet by mouth daily.   busPIRone 15 MG tablet Commonly known as: BUSPAR Take 15 mg by mouth 2 (two) times daily.   ciprofloxacin 500 MG  tablet Commonly known as: CIPRO Take 1 tablet (500 mg total) by mouth 2 (two) times daily for 7 days.   EPINEPHrine 0.3 mg/0.3 mL Soaj injection Commonly known as: EPI-PEN Inject 0.3 mg into the muscle as needed (allergic reaction).   gabapentin 300 MG capsule Commonly known as: NEURONTIN Take 300 mg by mouth 3 (three) times daily.   metroNIDAZOLE 500 MG tablet Commonly known as: Flagyl Take 1 tablet (500 mg total) by mouth 2 (two) times daily with a meal for 7 days. DO NOT CONSUME ALCOHOL WHILE TAKING THIS MEDICATION.   mirtazapine 15 MG tablet Commonly known as: REMERON Take 15 mg by mouth at bedtime.   omeprazole 40 MG capsule Commonly known as: PRILOSEC Take 40 mg by mouth daily.   oxycodone 30 MG immediate release tablet Commonly known as: ROXICODONE Take 30 mg by mouth every 4 (four) hours as needed.   polyethylene glycol 17 g packet Commonly known as: MIRALAX / GLYCOLAX Take 17 g by mouth daily.        Follow-up Information     Redmond School, MD. Schedule an appointment as soon as possible for a visit in 1 week(s).   Specialty: Internal Medicine Why: Hospital Follow Up Contact information: [place] [id number] [phone]                Allergies  Allergen Reactions   Bee Venom Anaphylaxis   Codeine Anaphylaxis and Swelling   Penicillins Anaphylaxis, Swelling and Other (See Comments)    Has patient had a PCN reaction causing immediate rash, facial/tongue/throat swelling, SOB or lightheadedness with hypotension: Yes Has patient had a PCN reaction causing severe rash involving mucus membranes or skin necrosis: Yes Has patient had a PCN reaction that required hospitalization: No Has patient had a PCN reaction occurring within the last 10 years: No If all of the above answers are "NO", then may proceed with Cephalosporin use.   Allergies as of 09/06/2022       Reactions   Bee Venom Anaphylaxis   Codeine Anaphylaxis,  Swelling   Penicillins Anaphylaxis, Swelling, Other (See Comments)   Has patient had a PCN reaction causing immediate rash, facial/tongue/throat swelling, SOB or lightheadedness with hypotension: Yes Has patient had a PCN reaction causing severe rash involving mucus membranes or skin necrosis: Yes Has patient had a PCN reaction that required hospitalization: No Has patient had a PCN reaction occurring within the last 10 years: No If all of the above answers are "NO", then may proceed with Cephalosporin use.        Medication List     STOP taking these medications    tiZANidine 4 MG tablet Commonly known as: ZANAFLEX       TAKE these medications    albuterol 108 (90 Base) MCG/ACT inhaler Commonly known as: VENTOLIN HFA Inhale 2 puffs into the lungs every 6 (six) hours as needed for wheezing or shortness of breath.   Belsomra 10 MG Tabs Generic drug: Suvorexant Take 1 tablet by mouth daily.   busPIRone 15 MG tablet Commonly known as: BUSPAR Take 15 mg by mouth 2 (two) times daily.   ciprofloxacin 500 MG tablet Commonly known as: CIPRO Take 1  tablet (500 mg total) by mouth 2 (two) times daily for 7 days.   EPINEPHrine 0.3 mg/0.3 mL Soaj injection Commonly known as: EPI-PEN Inject 0.3 mg into the muscle as needed (allergic reaction).   gabapentin 300 MG capsule Commonly known as: NEURONTIN Take 300 mg by mouth 3 (three) times daily.   metroNIDAZOLE 500 MG tablet Commonly known as: Flagyl Take 1 tablet (500 mg total) by mouth 2 (two) times daily with a meal for 7 days. DO NOT CONSUME ALCOHOL WHILE TAKING THIS MEDICATION.   mirtazapine 15 MG tablet Commonly known as: REMERON Take 15 mg by mouth at bedtime.   omeprazole 40 MG capsule Commonly known as: PRILOSEC Take 40 mg by mouth daily.   oxycodone 30 MG immediate release tablet Commonly known as: ROXICODONE Take 30 mg by mouth every 4 (four) hours as needed.   polyethylene glycol 17 g packet Commonly known  as: MIRALAX / GLYCOLAX Take 17 g by mouth daily.        Procedures/Studies: DG CHEST PORT 1 VIEW  Result Date: 09/05/2022 CLINICAL DATA:  Shortness of breath EXAM: PORTABLE CHEST 1 VIEW COMPARISON:  09/22/2016 FINDINGS: Normal heart size and mediastinal contours. Large lung volumes. No acute infiltrate or edema. No effusion or pneumothorax. No acute osseous findings. Artifact from EKG leads.  Right hilar calcified granuloma. IMPRESSION: No evidence of active disease.  Stable from prior. Electronically Signed   By: Jorje Guild [name].   On: 09/05/2022 12:10   CT ABDOMEN PELVIS W CONTRAST  Result Date: 09/04/2022 CLINICAL DATA:  Right lower quadrant abdominal pain. EXAM: CT ABDOMEN AND PELVIS WITH CONTRAST TECHNIQUE: Multidetector CT imaging of the abdomen and pelvis was performed using the standard protocol following bolus administration of intravenous contrast. RADIATION DOSE REDUCTION: This exam was performed according to the departmental dose-optimization program which includes automated exposure control, adjustment of the mA and/or kV according to patient size and/or use of iterative reconstruction technique. CONTRAST:  155m OMNIPAQUE IOHEXOL 300 MG/ML  SOLN COMPARISON:  MRI 09/19/2020.  CT 06/07/2020 FINDINGS: Lower chest: Lung bases are clear, allowing for motion artifact. Hepatobiliary: No focal liver lesions identified. Prominent intra and extrahepatic bile duct dilatation is unchanged since previous studies, likely indicating benign etiology. No mass or common duct stone is identified. The gallbladder appears to be surgically absent. Pancreas: Diffuse pancreatic ductal dilatation without mass or obstructing stone. Appearance is unchanged since prior study. Spleen: Normal in size without focal abnormality. Adrenals/Urinary Tract: No adrenal gland nodules. Kidneys are symmetrical. No hydronephrosis or hydroureter. Cyst in the upper pole left kidney measuring 1.4 cm diameter, unchanged. No  imaging follow-up is indicated. Focal scarring in the upper pole of the left kidney. 4 mm stone in the upper pole of the left kidney. Bladder is normal. Stomach/Bowel: Stomach, small bowel, and colon are not abnormally distended. Diverticulosis of the sigmoid colon. Mild mucosal thickening may indicate early changes of acute diverticulitis. No loculated collection or significant pericolonic stranding. Appendix is not identified. Vascular/Lymphatic: Aortic atherosclerosis with calcific and noncalcific plaque formation. No aneurysm. No significant lymphadenopathy. Reproductive: Status post hysterectomy. No adnexal masses. Other: No free air or free fluid in the abdomen. Abdominal wall musculature appears intact. Musculoskeletal: Postoperative changes with internal fixation at L5-S1 and of the right SI joint. Degenerative changes in the spine. IMPRESSION: 1. Stable appearance of intra and extrahepatic bile duct dilatation is well as pancreatic ductal dilatation since prior studies. No obstructing stone or lesion identified. 2. 4 mm nonobstructing stone in the  left kidney. 3. Sigmoid colonic diverticulosis with diffuse wall thickening suggesting early changes of acute diverticulitis. No abscess. 4. Aortic atherosclerosis. Electronically Signed   By: Lucienne Capers [name].   On: 09/04/2022 19:40     Subjective: Pt reports she is tolerating diet and pain is much better and she feels back to her baseline.    Discharge Exam: Vitals:   09/06/22 0114 09/06/22 0300  BP: (!) 149/76 131/64  Pulse: 72 60  Resp:  20  Temp:  98.6 F (37 C)  SpO2:  96%   Vitals:   09/05/22 1922 09/05/22 2051 09/06/22 0114 09/06/22 0300  BP:  (!) 144/63 (!) 149/76 131/64  Pulse:  88 72 60  Resp:  18  20  Temp:  98 F (36.7 C)  98.6 F (37 C)  TempSrc:  Oral  Oral  SpO2: 94% 93%  96%  Weight:      Height:        General: Pt is alert, awake, not in acute distress Cardiovascular: RRR, S1/S2 +, no rubs, no  gallops Respiratory: CTA bilaterally, no wheezing, no rhonchi Abdominal: Soft, NT, ND, bowel sounds + Extremities: no edema, no cyanosis   The results of significant diagnostics from this hospitalization (including imaging, microbiology, ancillary and laboratory) are listed below for reference.     Microbiology: No results found for this or any previous visit (from the past 240 hour(s)).   Labs: BNP (last 3 results) No results for input(s): "BNP" in the last 8760 hours. Basic Metabolic Panel: Recent Labs  Lab 09/04/22 1659 09/05/22 0409 09/06/22 0510  NA 133* 136 139  K 3.0* 3.4* 3.6  CL 99 103 105  CO2 23 23 24  $ GLUCOSE 124* 108* 97  BUN 13 11 18  $ CREATININE 0.44 0.51 0.64  CALCIUM 9.0 8.9 9.0  MG 2.1  --   --    Liver Function Tests: Recent Labs  Lab 09/04/22 1659 09/05/22 0409  AST 23 22  ALT 16 17  ALKPHOS 79 70  BILITOT 1.0 1.0  PROT 7.5 6.9  ALBUMIN 4.5 4.0   Recent Labs  Lab 09/04/22 1659  LIPASE 26   No results for input(s): "AMMONIA" in the last 168 hours. CBC: Recent Labs  Lab 09/04/22 1659 09/05/22 0409  WBC 8.3 9.1  HGB 16.1* 15.7*  HCT 47.8* 46.2*  MCV 91.2 89.7  PLT 198 198   Cardiac Enzymes: No results for input(s): "CKTOTAL", "CKMB", "CKMBINDEX", "TROPONINI" in the last 168 hours. BNP: Invalid input(s): "POCBNP" CBG: No results for input(s): "GLUCAP" in the last 168 hours. D-Dimer No results for input(s): "DDIMER" in the last 72 hours. Hgb A1c No results for input(s): "HGBA1C" in the last 72 hours. Lipid Profile No results for input(s): "CHOL", "HDL", "LDLCALC", "TRIG", "CHOLHDL", "LDLDIRECT" in the last 72 hours. Thyroid function studies No results for input(s): "TSH", "T4TOTAL", "T3FREE", "THYROIDAB" in the last 72 hours.  Invalid input(s): "FREET3" Anemia work up No results for input(s): "VITAMINB12", "FOLATE", "FERRITIN", "TIBC", "IRON", "RETICCTPCT" in the last 72 hours. Urinalysis    Component Value Date/Time    COLORURINE YELLOW 09/04/2022 2203   APPEARANCEUR CLEAR 09/04/2022 2203   LABSPEC 1.010 09/04/2022 2203   PHURINE 7.0 09/04/2022 2203   GLUCOSEU NEGATIVE 09/04/2022 2203   HGBUR NEGATIVE 09/04/2022 2203   BILIRUBINUR NEGATIVE 09/04/2022 2203   [place] 09/04/2022 2203   PROTEINUR NEGATIVE 09/04/2022 2203   UROBILINOGEN 1.0 02/21/2014 2210   NITRITE NEGATIVE 09/04/2022 2203   LEUKOCYTESUR NEGATIVE 09/04/2022  [place]  Lab 09/04/22 1659 09/05/22 0409  WBC 8.3 9.1   Microbiology No results found for this or any previous visit (from the past 240 hour(s)).  Time coordinating discharge: 36 mins  SIGNED:  Irwin Brakeman, MD  [facility] 09/06/2022, 10:15 AM How to contact the [facility] Attending or Consulting provider [place] or covering provider during after hours [place], for this patient?  Check the care team in [facility] and look for a) attending/consulting [facility] provider listed and b) the [facility] team listed Log into [email or web address] and use [place] universal password to access. If you do not have the password, please contact the hospital operator. Locate the [facility] provider you are looking for under [facility] and page to a number that you can be directly reached. If you still have difficulty reaching the provider, please page the [facility] (Director on Call) for the Hospitalists listed on amion for assistance.

## 2022-09-06 NOTE — Discharge Instructions (Signed)
IMPORTANT INFORMATION: PAY CLOSE ATTENTION  ? ?PHYSICIAN DISCHARGE INSTRUCTIONS ? ?Follow with Primary care provider  Fusco, Lawrence, MD  and other consultants as instructed by your Hospitalist Physician ? ?SEEK MEDICAL CARE OR RETURN TO EMERGENCY ROOM IF SYMPTOMS COME BACK, WORSEN OR NEW PROBLEM DEVELOPS  ? ?Please note: ?You were cared for by a hospitalist during your hospital stay. Every effort will be made to forward records to your primary care provider.  You can request that your primary care provider send for your hospital records if they have not received them.  Once you are discharged, your primary care physician will handle any further medical issues. Please note that NO REFILLS for any discharge medications will be authorized once you are discharged, as it is imperative that you return to your primary care physician (or establish a relationship with a primary care physician if you do not have one) for your post hospital discharge needs so that they can reassess your need for medications and monitor your lab values. ? ?Please get a complete blood count and chemistry panel checked by your Primary MD at your next visit, and again as instructed by your Primary MD. ? ?Get Medicines reviewed and adjusted: ?Please take all your medications with you for your next visit with your Primary MD ? ?Laboratory/radiological data: ?Please request your Primary MD to go over all hospital tests and procedure/radiological results at the follow up, please ask your primary care provider to get all Hospital records sent to his/her office. ? ?In some cases, they will be blood work, cultures and biopsy results pending at the time of your discharge. Please request that your primary care provider follow up on these results. ? ?If you are diabetic, please bring your blood sugar readings with you to your follow up appointment with primary care.   ? ?Please call and make your follow up appointments as soon as possible.   ? ?Also Note  the following: ?If you experience worsening of your admission symptoms, develop shortness of breath, life threatening emergency, suicidal or homicidal thoughts you must seek medical attention immediately by calling 911 or calling your MD immediately  if symptoms less severe. ? ?You must read complete instructions/literature along with all the possible adverse reactions/side effects for all the Medicines you take and that have been prescribed to you. Take any new Medicines after you have completely understood and accpet all the possible adverse reactions/side effects.  ? ?Do not drive when taking Pain medications or sleeping medications (Benzodiazepines) ? ?Do not take more than prescribed Pain, Sleep and Anxiety Medications. It is not advisable to combine anxiety,sleep and pain medications without talking with your primary care practitioner ? ?Special Instructions: If you have smoked or chewed Tobacco  in the last 2 yrs please stop smoking, stop any regular Alcohol  and or any Recreational drug use. ? ?Wear Seat belts while driving.  Do not drive if taking any narcotic, mind altering or controlled substances or recreational drugs or alcohol.  ? ? ? ? ? ?

## 2022-09-06 NOTE — Plan of Care (Signed)
  Problem: Education: Goal: Knowledge of General Education information will improve Description: Including pain rating scale, medication(s)/side effects and non-pharmacologic comfort measures 09/06/2022 0939 by Santa Lighter, RN Outcome: Adequate for Discharge 09/06/2022 0939 by Santa Lighter, RN Outcome: Progressing   Problem: Health Behavior/Discharge Planning: Goal: Ability to manage health-related needs will improve 09/06/2022 0939 by Santa Lighter, RN Outcome: Adequate for Discharge 09/06/2022 0939 by Santa Lighter, RN Outcome: Progressing   Problem: Clinical Measurements: Goal: Ability to maintain clinical measurements within normal limits will improve Outcome: Adequate for Discharge Goal: Will remain free from infection Outcome: Adequate for Discharge Goal: Diagnostic test results will improve Outcome: Adequate for Discharge Goal: Respiratory complications will improve Outcome: Adequate for Discharge Goal: Cardiovascular complication will be avoided Outcome: Adequate for Discharge   Problem: Activity: Goal: Risk for activity intolerance will decrease Outcome: Adequate for Discharge   Problem: Nutrition: Goal: Adequate nutrition will be maintained Outcome: Adequate for Discharge   Problem: Coping: Goal: Level of anxiety will decrease Outcome: Adequate for Discharge   Problem: Elimination: Goal: Will not experience complications related to bowel motility Outcome: Adequate for Discharge Goal: Will not experience complications related to urinary retention Outcome: Adequate for Discharge   Problem: Pain Managment: Goal: General experience of comfort will improve Outcome: Adequate for Discharge   Problem: Safety: Goal: Ability to remain free from injury will improve Outcome: Adequate for Discharge   Problem: Skin Integrity: Goal: Risk for impaired skin integrity will decrease Outcome: Adequate for Discharge

## 2022-09-06 NOTE — Plan of Care (Signed)
  Problem: Education: Goal: Knowledge of General Education information will improve Description Including pain rating scale, medication(s)/side effects and non-pharmacologic comfort measures Outcome: Progressing   Problem: Health Behavior/Discharge Planning: Goal: Ability to manage health-related needs will improve Outcome: Progressing   

## 2022-09-23 DIAGNOSIS — I10 Essential (primary) hypertension: Secondary | ICD-10-CM | POA: Diagnosis not present

## 2022-09-23 DIAGNOSIS — I700 Atherosclerosis of aorta: Secondary | ICD-10-CM | POA: Diagnosis not present

## 2022-09-23 DIAGNOSIS — J449 Chronic obstructive pulmonary disease, unspecified: Secondary | ICD-10-CM | POA: Diagnosis not present

## 2022-09-23 DIAGNOSIS — G894 Chronic pain syndrome: Secondary | ICD-10-CM | POA: Diagnosis not present

## 2022-10-22 DIAGNOSIS — Z681 Body mass index (BMI) 19 or less, adult: Secondary | ICD-10-CM | POA: Diagnosis not present

## 2022-10-22 DIAGNOSIS — J449 Chronic obstructive pulmonary disease, unspecified: Secondary | ICD-10-CM | POA: Diagnosis not present

## 2022-10-22 DIAGNOSIS — G894 Chronic pain syndrome: Secondary | ICD-10-CM | POA: Diagnosis not present

## 2022-10-22 DIAGNOSIS — I10 Essential (primary) hypertension: Secondary | ICD-10-CM | POA: Diagnosis not present

## 2022-10-22 DIAGNOSIS — M159 Polyosteoarthritis, unspecified: Secondary | ICD-10-CM | POA: Diagnosis not present

## 2022-10-28 ENCOUNTER — Ambulatory Visit: Payer: PPO | Admitting: General Surgery

## 2022-12-01 DIAGNOSIS — G894 Chronic pain syndrome: Secondary | ICD-10-CM | POA: Diagnosis not present

## 2022-12-01 DIAGNOSIS — M159 Polyosteoarthritis, unspecified: Secondary | ICD-10-CM | POA: Diagnosis not present

## 2022-12-01 DIAGNOSIS — J449 Chronic obstructive pulmonary disease, unspecified: Secondary | ICD-10-CM | POA: Diagnosis not present

## 2022-12-01 DIAGNOSIS — I10 Essential (primary) hypertension: Secondary | ICD-10-CM | POA: Diagnosis not present

## 2023-01-21 DIAGNOSIS — I10 Essential (primary) hypertension: Secondary | ICD-10-CM | POA: Diagnosis not present

## 2023-01-21 DIAGNOSIS — G894 Chronic pain syndrome: Secondary | ICD-10-CM | POA: Diagnosis not present

## 2023-01-21 DIAGNOSIS — M159 Polyosteoarthritis, unspecified: Secondary | ICD-10-CM | POA: Diagnosis not present

## 2023-03-20 DIAGNOSIS — M159 Polyosteoarthritis, unspecified: Secondary | ICD-10-CM | POA: Diagnosis not present

## 2023-03-20 DIAGNOSIS — G894 Chronic pain syndrome: Secondary | ICD-10-CM | POA: Diagnosis not present

## 2023-03-20 DIAGNOSIS — Z681 Body mass index (BMI) 19 or less, adult: Secondary | ICD-10-CM | POA: Diagnosis not present

## 2023-03-20 DIAGNOSIS — I10 Essential (primary) hypertension: Secondary | ICD-10-CM | POA: Diagnosis not present

## 2023-03-20 DIAGNOSIS — E440 Moderate protein-calorie malnutrition: Secondary | ICD-10-CM | POA: Diagnosis not present

## 2023-03-20 DIAGNOSIS — I700 Atherosclerosis of aorta: Secondary | ICD-10-CM | POA: Diagnosis not present

## 2023-03-20 DIAGNOSIS — J449 Chronic obstructive pulmonary disease, unspecified: Secondary | ICD-10-CM | POA: Diagnosis not present

## 2023-04-20 ENCOUNTER — Other Ambulatory Visit: Payer: Self-pay | Admitting: *Deleted

## 2023-04-20 DIAGNOSIS — J449 Chronic obstructive pulmonary disease, unspecified: Secondary | ICD-10-CM | POA: Diagnosis not present

## 2023-04-20 DIAGNOSIS — K469 Unspecified abdominal hernia without obstruction or gangrene: Secondary | ICD-10-CM

## 2023-04-20 DIAGNOSIS — G894 Chronic pain syndrome: Secondary | ICD-10-CM | POA: Diagnosis not present

## 2023-04-20 DIAGNOSIS — K219 Gastro-esophageal reflux disease without esophagitis: Secondary | ICD-10-CM | POA: Diagnosis not present

## 2023-04-21 ENCOUNTER — Ambulatory Visit: Payer: PPO | Admitting: General Surgery

## 2023-04-21 DIAGNOSIS — I10 Essential (primary) hypertension: Secondary | ICD-10-CM | POA: Diagnosis not present

## 2023-04-21 DIAGNOSIS — M159 Polyosteoarthritis, unspecified: Secondary | ICD-10-CM | POA: Diagnosis not present

## 2023-04-21 DIAGNOSIS — J449 Chronic obstructive pulmonary disease, unspecified: Secondary | ICD-10-CM | POA: Diagnosis not present

## 2023-04-21 DIAGNOSIS — G894 Chronic pain syndrome: Secondary | ICD-10-CM | POA: Diagnosis not present

## 2023-05-21 DIAGNOSIS — E440 Moderate protein-calorie malnutrition: Secondary | ICD-10-CM | POA: Diagnosis not present

## 2023-05-21 DIAGNOSIS — G894 Chronic pain syndrome: Secondary | ICD-10-CM | POA: Diagnosis not present

## 2023-05-21 DIAGNOSIS — F419 Anxiety disorder, unspecified: Secondary | ICD-10-CM | POA: Diagnosis not present

## 2023-05-21 DIAGNOSIS — K219 Gastro-esophageal reflux disease without esophagitis: Secondary | ICD-10-CM | POA: Diagnosis not present

## 2023-05-21 DIAGNOSIS — J449 Chronic obstructive pulmonary disease, unspecified: Secondary | ICD-10-CM | POA: Diagnosis not present

## 2023-05-21 DIAGNOSIS — I10 Essential (primary) hypertension: Secondary | ICD-10-CM | POA: Diagnosis not present

## 2023-06-15 DIAGNOSIS — E440 Moderate protein-calorie malnutrition: Secondary | ICD-10-CM | POA: Diagnosis not present

## 2023-06-15 DIAGNOSIS — I10 Essential (primary) hypertension: Secondary | ICD-10-CM | POA: Diagnosis not present

## 2023-06-15 DIAGNOSIS — M159 Polyosteoarthritis, unspecified: Secondary | ICD-10-CM | POA: Diagnosis not present

## 2023-06-15 DIAGNOSIS — F1120 Opioid dependence, uncomplicated: Secondary | ICD-10-CM | POA: Diagnosis not present

## 2023-06-15 DIAGNOSIS — J449 Chronic obstructive pulmonary disease, unspecified: Secondary | ICD-10-CM | POA: Diagnosis not present

## 2023-06-15 DIAGNOSIS — G894 Chronic pain syndrome: Secondary | ICD-10-CM | POA: Diagnosis not present

## 2023-06-20 DIAGNOSIS — G894 Chronic pain syndrome: Secondary | ICD-10-CM | POA: Diagnosis not present

## 2023-06-20 DIAGNOSIS — F1120 Opioid dependence, uncomplicated: Secondary | ICD-10-CM | POA: Diagnosis not present

## 2023-06-20 DIAGNOSIS — J449 Chronic obstructive pulmonary disease, unspecified: Secondary | ICD-10-CM | POA: Diagnosis not present

## 2023-06-20 DIAGNOSIS — M159 Polyosteoarthritis, unspecified: Secondary | ICD-10-CM | POA: Diagnosis not present

## 2023-07-08 DIAGNOSIS — G894 Chronic pain syndrome: Secondary | ICD-10-CM | POA: Diagnosis not present

## 2023-07-08 DIAGNOSIS — F1120 Opioid dependence, uncomplicated: Secondary | ICD-10-CM | POA: Diagnosis not present

## 2023-07-08 DIAGNOSIS — Z681 Body mass index (BMI) 19 or less, adult: Secondary | ICD-10-CM | POA: Diagnosis not present

## 2023-07-08 DIAGNOSIS — I10 Essential (primary) hypertension: Secondary | ICD-10-CM | POA: Diagnosis not present

## 2023-07-08 DIAGNOSIS — J449 Chronic obstructive pulmonary disease, unspecified: Secondary | ICD-10-CM | POA: Diagnosis not present

## 2023-07-08 DIAGNOSIS — M159 Polyosteoarthritis, unspecified: Secondary | ICD-10-CM | POA: Diagnosis not present

## 2023-07-21 DIAGNOSIS — F1120 Opioid dependence, uncomplicated: Secondary | ICD-10-CM | POA: Diagnosis not present

## 2023-07-21 DIAGNOSIS — J449 Chronic obstructive pulmonary disease, unspecified: Secondary | ICD-10-CM | POA: Diagnosis not present

## 2023-07-21 DIAGNOSIS — G894 Chronic pain syndrome: Secondary | ICD-10-CM | POA: Diagnosis not present

## 2023-08-10 DIAGNOSIS — I10 Essential (primary) hypertension: Secondary | ICD-10-CM | POA: Diagnosis not present

## 2023-08-10 DIAGNOSIS — M159 Polyosteoarthritis, unspecified: Secondary | ICD-10-CM | POA: Diagnosis not present

## 2023-08-10 DIAGNOSIS — Z681 Body mass index (BMI) 19 or less, adult: Secondary | ICD-10-CM | POA: Diagnosis not present

## 2023-08-10 DIAGNOSIS — F1120 Opioid dependence, uncomplicated: Secondary | ICD-10-CM | POA: Diagnosis not present

## 2023-08-10 DIAGNOSIS — J449 Chronic obstructive pulmonary disease, unspecified: Secondary | ICD-10-CM | POA: Diagnosis not present

## 2023-08-10 DIAGNOSIS — G894 Chronic pain syndrome: Secondary | ICD-10-CM | POA: Diagnosis not present

## 2023-08-21 DIAGNOSIS — F419 Anxiety disorder, unspecified: Secondary | ICD-10-CM | POA: Diagnosis not present

## 2023-08-21 DIAGNOSIS — J449 Chronic obstructive pulmonary disease, unspecified: Secondary | ICD-10-CM | POA: Diagnosis not present

## 2023-09-10 DIAGNOSIS — I10 Essential (primary) hypertension: Secondary | ICD-10-CM | POA: Diagnosis not present

## 2023-09-10 DIAGNOSIS — J449 Chronic obstructive pulmonary disease, unspecified: Secondary | ICD-10-CM | POA: Diagnosis not present

## 2023-09-10 DIAGNOSIS — G894 Chronic pain syndrome: Secondary | ICD-10-CM | POA: Diagnosis not present

## 2023-09-10 DIAGNOSIS — F1120 Opioid dependence, uncomplicated: Secondary | ICD-10-CM | POA: Diagnosis not present

## 2023-09-10 DIAGNOSIS — N390 Urinary tract infection, site not specified: Secondary | ICD-10-CM | POA: Diagnosis not present

## 2023-10-08 DIAGNOSIS — G894 Chronic pain syndrome: Secondary | ICD-10-CM | POA: Diagnosis not present

## 2023-10-08 DIAGNOSIS — F419 Anxiety disorder, unspecified: Secondary | ICD-10-CM | POA: Diagnosis not present

## 2023-10-08 DIAGNOSIS — I10 Essential (primary) hypertension: Secondary | ICD-10-CM | POA: Diagnosis not present

## 2023-10-08 DIAGNOSIS — J449 Chronic obstructive pulmonary disease, unspecified: Secondary | ICD-10-CM | POA: Diagnosis not present

## 2023-11-03 DIAGNOSIS — F1120 Opioid dependence, uncomplicated: Secondary | ICD-10-CM | POA: Diagnosis not present

## 2023-11-03 DIAGNOSIS — E440 Moderate protein-calorie malnutrition: Secondary | ICD-10-CM | POA: Diagnosis not present

## 2023-11-03 DIAGNOSIS — Z681 Body mass index (BMI) 19 or less, adult: Secondary | ICD-10-CM | POA: Diagnosis not present

## 2023-11-03 DIAGNOSIS — M159 Polyosteoarthritis, unspecified: Secondary | ICD-10-CM | POA: Diagnosis not present

## 2023-11-03 DIAGNOSIS — G894 Chronic pain syndrome: Secondary | ICD-10-CM | POA: Diagnosis not present

## 2023-11-03 DIAGNOSIS — J029 Acute pharyngitis, unspecified: Secondary | ICD-10-CM | POA: Diagnosis not present

## 2023-11-03 DIAGNOSIS — J449 Chronic obstructive pulmonary disease, unspecified: Secondary | ICD-10-CM | POA: Diagnosis not present

## 2023-11-03 DIAGNOSIS — I10 Essential (primary) hypertension: Secondary | ICD-10-CM | POA: Diagnosis not present

## 2023-12-03 DIAGNOSIS — F419 Anxiety disorder, unspecified: Secondary | ICD-10-CM | POA: Diagnosis not present

## 2023-12-03 DIAGNOSIS — F1120 Opioid dependence, uncomplicated: Secondary | ICD-10-CM | POA: Diagnosis not present

## 2023-12-03 DIAGNOSIS — M159 Polyosteoarthritis, unspecified: Secondary | ICD-10-CM | POA: Diagnosis not present

## 2023-12-03 DIAGNOSIS — G894 Chronic pain syndrome: Secondary | ICD-10-CM | POA: Diagnosis not present

## 2023-12-03 DIAGNOSIS — I10 Essential (primary) hypertension: Secondary | ICD-10-CM | POA: Diagnosis not present

## 2023-12-03 DIAGNOSIS — J449 Chronic obstructive pulmonary disease, unspecified: Secondary | ICD-10-CM | POA: Diagnosis not present

## 2023-12-17 ENCOUNTER — Other Ambulatory Visit: Payer: Self-pay | Admitting: Internal Medicine

## 2023-12-17 DIAGNOSIS — Z1231 Encounter for screening mammogram for malignant neoplasm of breast: Secondary | ICD-10-CM

## 2023-12-23 ENCOUNTER — Encounter

## 2024-01-01 DIAGNOSIS — F419 Anxiety disorder, unspecified: Secondary | ICD-10-CM | POA: Diagnosis not present

## 2024-01-01 DIAGNOSIS — I10 Essential (primary) hypertension: Secondary | ICD-10-CM | POA: Diagnosis not present

## 2024-01-01 DIAGNOSIS — J449 Chronic obstructive pulmonary disease, unspecified: Secondary | ICD-10-CM | POA: Diagnosis not present

## 2024-01-01 DIAGNOSIS — M159 Polyosteoarthritis, unspecified: Secondary | ICD-10-CM | POA: Diagnosis not present

## 2024-01-01 DIAGNOSIS — G894 Chronic pain syndrome: Secondary | ICD-10-CM | POA: Diagnosis not present

## 2024-02-09 DIAGNOSIS — F1120 Opioid dependence, uncomplicated: Secondary | ICD-10-CM | POA: Diagnosis not present

## 2024-02-09 DIAGNOSIS — Z681 Body mass index (BMI) 19 or less, adult: Secondary | ICD-10-CM | POA: Diagnosis not present

## 2024-02-09 DIAGNOSIS — M159 Polyosteoarthritis, unspecified: Secondary | ICD-10-CM | POA: Diagnosis not present

## 2024-02-09 DIAGNOSIS — I10 Essential (primary) hypertension: Secondary | ICD-10-CM | POA: Diagnosis not present

## 2024-02-09 DIAGNOSIS — G894 Chronic pain syndrome: Secondary | ICD-10-CM | POA: Diagnosis not present

## 2024-02-09 DIAGNOSIS — J449 Chronic obstructive pulmonary disease, unspecified: Secondary | ICD-10-CM | POA: Diagnosis not present

## 2024-03-11 DIAGNOSIS — G894 Chronic pain syndrome: Secondary | ICD-10-CM | POA: Diagnosis not present

## 2024-03-11 DIAGNOSIS — E440 Moderate protein-calorie malnutrition: Secondary | ICD-10-CM | POA: Diagnosis not present

## 2024-03-11 DIAGNOSIS — J449 Chronic obstructive pulmonary disease, unspecified: Secondary | ICD-10-CM | POA: Diagnosis not present

## 2024-03-11 DIAGNOSIS — M159 Polyosteoarthritis, unspecified: Secondary | ICD-10-CM | POA: Diagnosis not present

## 2024-03-11 DIAGNOSIS — I10 Essential (primary) hypertension: Secondary | ICD-10-CM | POA: Diagnosis not present

## 2024-04-13 DIAGNOSIS — I10 Essential (primary) hypertension: Secondary | ICD-10-CM | POA: Diagnosis not present

## 2024-04-13 DIAGNOSIS — K4090 Unilateral inguinal hernia, without obstruction or gangrene, not specified as recurrent: Secondary | ICD-10-CM | POA: Diagnosis not present

## 2024-04-13 DIAGNOSIS — Z7689 Persons encountering health services in other specified circumstances: Secondary | ICD-10-CM | POA: Diagnosis not present

## 2024-04-13 DIAGNOSIS — F5104 Psychophysiologic insomnia: Secondary | ICD-10-CM | POA: Diagnosis not present

## 2024-04-13 DIAGNOSIS — G894 Chronic pain syndrome: Secondary | ICD-10-CM | POA: Diagnosis not present

## 2024-04-13 DIAGNOSIS — M159 Polyosteoarthritis, unspecified: Secondary | ICD-10-CM | POA: Diagnosis not present

## 2024-04-13 DIAGNOSIS — Z681 Body mass index (BMI) 19 or less, adult: Secondary | ICD-10-CM | POA: Diagnosis not present

## 2024-04-13 DIAGNOSIS — Z79899 Other long term (current) drug therapy: Secondary | ICD-10-CM | POA: Diagnosis not present

## 2024-04-13 DIAGNOSIS — F17210 Nicotine dependence, cigarettes, uncomplicated: Secondary | ICD-10-CM | POA: Diagnosis not present

## 2024-04-13 DIAGNOSIS — J4489 Other specified chronic obstructive pulmonary disease: Secondary | ICD-10-CM | POA: Diagnosis not present
